# Patient Record
Sex: Female | Born: 1955 | Race: White | Hispanic: No | Marital: Married | Smoking: Never smoker
Health system: Southern US, Community
[De-identification: ages and names within clinical notes are randomized; demographics above are authoritative.]

## PROBLEM LIST (undated history)

## (undated) DIAGNOSIS — E119 Type 2 diabetes mellitus without complications: Secondary | ICD-10-CM

## (undated) DIAGNOSIS — M1990 Unspecified osteoarthritis, unspecified site: Secondary | ICD-10-CM

## (undated) DIAGNOSIS — Z973 Presence of spectacles and contact lenses: Secondary | ICD-10-CM

## (undated) DIAGNOSIS — I10 Essential (primary) hypertension: Secondary | ICD-10-CM

## (undated) DIAGNOSIS — E785 Hyperlipidemia, unspecified: Secondary | ICD-10-CM

## (undated) DIAGNOSIS — N200 Calculus of kidney: Secondary | ICD-10-CM

## (undated) DIAGNOSIS — T7840XA Allergy, unspecified, initial encounter: Secondary | ICD-10-CM

## (undated) DIAGNOSIS — E782 Mixed hyperlipidemia: Secondary | ICD-10-CM

## (undated) DIAGNOSIS — F419 Anxiety disorder, unspecified: Secondary | ICD-10-CM

## (undated) DIAGNOSIS — J189 Pneumonia, unspecified organism: Secondary | ICD-10-CM

## (undated) DIAGNOSIS — Z889 Allergy status to unspecified drugs, medicaments and biological substances status: Secondary | ICD-10-CM

## (undated) DIAGNOSIS — Z87442 Personal history of urinary calculi: Secondary | ICD-10-CM

## (undated) HISTORY — DX: Mixed hyperlipidemia: E78.2

## (undated) HISTORY — DX: Essential (primary) hypertension: I10

## (undated) HISTORY — PX: ABDOMINAL HYSTERECTOMY: SHX81

## (undated) HISTORY — DX: Allergy, unspecified, initial encounter: T78.40XA

---

## 1996-08-05 HISTORY — PX: TOTAL ABDOMINAL HYSTERECTOMY W/ BILATERAL SALPINGOOPHORECTOMY: SHX83

## 2000-03-24 ENCOUNTER — Encounter: Admission: RE | Admit: 2000-03-24 | Discharge: 2000-03-24 | Payer: Self-pay | Admitting: Family Medicine

## 2000-03-24 ENCOUNTER — Encounter: Payer: Self-pay | Admitting: Family Medicine

## 2001-03-26 ENCOUNTER — Encounter: Payer: Self-pay | Admitting: Obstetrics and Gynecology

## 2001-03-26 ENCOUNTER — Encounter: Admission: RE | Admit: 2001-03-26 | Discharge: 2001-03-26 | Payer: Self-pay | Admitting: Obstetrics and Gynecology

## 2003-03-21 ENCOUNTER — Encounter: Payer: Self-pay | Admitting: Family Medicine

## 2003-03-21 ENCOUNTER — Encounter: Admission: RE | Admit: 2003-03-21 | Discharge: 2003-03-21 | Payer: Self-pay | Admitting: Family Medicine

## 2003-06-22 ENCOUNTER — Encounter: Admission: RE | Admit: 2003-06-22 | Discharge: 2003-06-22 | Payer: Self-pay | Admitting: Family Medicine

## 2004-08-22 ENCOUNTER — Encounter: Admission: RE | Admit: 2004-08-22 | Discharge: 2004-08-22 | Payer: Self-pay | Admitting: Family Medicine

## 2005-06-14 ENCOUNTER — Other Ambulatory Visit: Admission: RE | Admit: 2005-06-14 | Discharge: 2005-06-14 | Payer: Self-pay | Admitting: Family Medicine

## 2006-02-17 ENCOUNTER — Ambulatory Visit: Payer: Self-pay | Admitting: Gastroenterology

## 2006-09-23 ENCOUNTER — Ambulatory Visit: Payer: Self-pay | Admitting: Cardiology

## 2006-10-21 ENCOUNTER — Ambulatory Visit: Payer: Self-pay

## 2009-01-24 ENCOUNTER — Encounter: Admission: RE | Admit: 2009-01-24 | Discharge: 2009-01-24 | Payer: Self-pay | Admitting: Orthopedic Surgery

## 2010-08-25 ENCOUNTER — Encounter: Payer: Self-pay | Admitting: Family Medicine

## 2010-12-21 NOTE — Assessment & Plan Note (Signed)
[facility]                           GASTROENTEROLOGY OFFICE NOTE   Kathy [name], Kathy [name]                    MRN:          [id number]  DATE:02/17/2006                            DOB:          1955-12-24    REASON FOR CONSULTATION:  Diverticulitis.   Kathy [name] is a pleasant 55 year old white female referred through the  courtesy of Dr. Smith Mince for evaluation.  In June 2007 she was diagnosed  with acute diverticulitis and treated with antibiotics.  Diagnosis was made  by CAT scan.  She had a prior episode approximately 1-1/2 years before.  Over the last few weeks Kathy [name] has been pain-free.  There is no history  of hematochezia or melena.  There has been no change in her bowel habits.   PAST MEDICAL HISTORY:  Pertinent for hypertension.  She has had kidney  stones.  She is status post hysterectomy and tubal ligation.   FAMILY HISTORY:  Pertinent for mother with breast cancer.   MEDICATIONS:  Include:  Tiazac and Dyazide.   ALLERGIES:  SHE HAS NO ALLERGIES.   She does not smoke.  She drinks rarely.  She is widowed and works as an  Teacher, adult education.   REVIEW OF SYSTEMS:  Positive for sleeping problems and back pain.   PHYSICAL EXAMINATION:  VITAL SIGNS:  Pulse 80, blood pressure 130/88, weight  166 pounds.  HEENT: EOMI. PERRLA. Sclerae are anicteric.  Conjunctivae are pink.  NECK:  Supple without thyromegaly, adenopathy or carotid bruits.  CHEST:  Clear to auscultation and percussion without adventitious sounds.  CARDIAC:  Regular rhythm; normal S1 S2.  There are no murmurs, gallops or  rubs.  ABDOMEN: Bowel sounds are normoactive.  Abdomen is soft, non-tender and non-  distended.  There are no abdominal masses, tenderness, splenic enlargement  or hepatomegaly.  EXTREMITIES:  Full range of motion.  No cyanosis, clubbing or edema.  RECTAL:  Deferred.   IMPRESSION:  Recent recurrent diverticulitis-resolved.   RECOMMENDATION:  Screening  colonoscopy.                                   Barbette Hair. Arlyce Dice, MD, [facility]   RDK/MedQ  DD:  02/17/2006  DT:  02/18/2006  Job #:  [id number]   cc:   Talmadge Coventry, M.D.

## 2010-12-21 NOTE — Assessment & Plan Note (Signed)
[facility]                            CARDIOLOGY OFFICE NOTE   SHAYMA, PFEFFERLE                    MRN:          [id number]  DATE:09/23/2006                            DOB:          Feb 25, 1956    Kathy [name] is a very pleasant 55 year old female with a past medical  history of borderline diabetes mellitus, hypertension, hyperlipidemia,  who we were asked to evaluate for chest pain.  She has no prior cardiac  history.  She typically does not have dyspnea on exertion, orthopnea,  PND, pedal edema, palpations, presyncope, syncope or exertional chest  pain.  This past Saturday she was having intercourse with her boyfriend.  She subsequently developed pain in the left upper chest area.  It was  described as a sharp sensation with mild radiation to the left upper  arm.  The pain did increase with certain movements and also with deep  inspiration.  It lasted for approximately 15 minutes and it resolved  spontaneously.  There was no associated nausea, vomiting, shortness of  breath or diaphoresis.  She has had no symptoms since.  Because of her  pain we were asked to further evaluate.  Of note she has not traveled  recently, nor has she injured her legs.   Her medications include:  1. Tiazac 360 mg p.o. daily.  2. Dyazide 37.5/25 mg tablets, 1 p.o. daily.  3. Crestor 10 mg p.o. daily.  4. She takes Xanax as needed.   She has no known drug allergies.   SOCIAL HISTORY:  She does not smoke.  She does occasionally consume  alcohol.   FAMILY HISTORY:  Is negative for coronary artery disease.  Her father  died of a cerebral aneurysm at age 59.   PAST MEDICAL HISTORY:  Significant for hypertension, hyperlipidemia.  There is borderline diabetes mellitus.  She has a history of  nephrolithiasis.  She has had a prior hysterectomy.   REVIEW OF SYSTEMS:  She denies any headaches or fevers or chills. There  is no productive cough or hemoptysis.  There is  no dysphagia,  odynophagia, melena or hematochezia.  There is no dysuria, hematuria.  She denies seizure activity.  There is no orthopnea, PND or pedal edema.  The remaining systems are negative.   PHYSICAL EXAMINATION:  Today, she is well-developed and mildly obese.  She is in no acute distress.  SKIN:  Is warm and dry.  She does not appear depressed.  There is no peripheral clubbing.  Her back is normal.  I cannot appreciate adenopathy on exam.  HEENT:  Is unremarkable with normal eyelids.  NECK:  Is supple with normal upstroke bilaterally.  There are no bruits.  There is no jugular venous distention.  No thyromegaly.  CHEST:  Clear to auscultation on expansion.  CARDIOVASCULAR:  Shows a regular rate and rhythm with normal S1 and S2.  There is a 1/6 systolic ejection murmur left sternal border.  I cannot  appreciate S3 or S4.  Her PMI is nondisplaced.  She is not tender to  palpation over the left chest area.  ABDOMEN:  Nontender,  positive bowel sounds.  No hepatosplenomegaly.  No  masses appreciated.  There is no abdominal bruit.  She has 2+ femoral  pulses bilaterally.  No bruits.  EXTREMITIES:  Show no edema.  I can palpate no cords.  She has a  negative Homan's bilaterally.  She has 2+ dorsalis pedis pulses  bilaterally.  NEUROLOGICAL:  Is grossly intact.   Electrocardiogram shows a sinus rhythm at a rate of 67.  Her axis is  normal.  There are no ST changes noted.   DIAGNOSES:  1. Atypical chest pain.  2. Hypertension.  3. Hyperlipidemia.  4. Borderline diabetes mellitus.   PLAN:  Mrs. Manka presents for evaluation of chest pain that is  somewhat atypical.  Her electrocardiogram shows no ST changes.  She did  state that there was a pleuritic component.  I do not palpate cords on  exam and she has a negative Homan's bilaterally.  I will schedule her to  have a D-Dimer.  If this is unremarkable, then I think the likelihood of  pulmonary embolus is almost zero.  She is  also contemplating beginning  an exercise program for weight loss.  I think her chest pain is atypical  for ischemia but we will risk stratify with a stress Myoview.  If she  has normal perfusion we will not proceed with further cardiac workup.  We will continue with risk factor modification today and I discussed  diet and exercise.  She will see [place] back on an as needed basis pending  those results.     Madolyn Frieze Jens Som, MD, [facility]  Electronically Signed    BSC/MedQ  DD: 09/23/2006  DT: 09/24/2006  Job #: [id number]   cc:   Talmadge Coventry, M.D.

## 2013-11-21 ENCOUNTER — Encounter (HOSPITAL_BASED_OUTPATIENT_CLINIC_OR_DEPARTMENT_OTHER): Payer: Self-pay | Admitting: Emergency Medicine

## 2013-11-21 ENCOUNTER — Emergency Department (HOSPITAL_BASED_OUTPATIENT_CLINIC_OR_DEPARTMENT_OTHER)
Admission: EM | Admit: 2013-11-21 | Discharge: 2013-11-21 | Disposition: A | Discharge status: Home / Self Care | Payer: BC Managed Care – PPO | Attending: Emergency Medicine | Admitting: Emergency Medicine

## 2013-11-21 ENCOUNTER — Emergency Department (HOSPITAL_BASED_OUTPATIENT_CLINIC_OR_DEPARTMENT_OTHER): Payer: BC Managed Care – PPO

## 2013-11-21 DIAGNOSIS — M25579 Pain in unspecified ankle and joints of unspecified foot: Secondary | ICD-10-CM | POA: Insufficient documentation

## 2013-11-21 DIAGNOSIS — Z79899 Other long term (current) drug therapy: Secondary | ICD-10-CM | POA: Insufficient documentation

## 2013-11-21 DIAGNOSIS — E119 Type 2 diabetes mellitus without complications: Secondary | ICD-10-CM | POA: Insufficient documentation

## 2013-11-21 DIAGNOSIS — I10 Essential (primary) hypertension: Secondary | ICD-10-CM | POA: Insufficient documentation

## 2013-11-21 DIAGNOSIS — M79671 Pain in right foot: Secondary | ICD-10-CM

## 2013-11-21 HISTORY — DX: Essential (primary) hypertension: I10

## 2013-11-21 HISTORY — DX: Type 2 diabetes mellitus without complications: E11.9

## 2013-11-21 MED ORDER — HYDROCODONE-ACETAMINOPHEN 5-325 MG PO TABS: 2.0000 | ORAL_TABLET | ORAL | Status: DC | PRN

## 2013-11-21 NOTE — ED Notes (Signed)
The patient refused the post-op shoe.

## 2013-11-21 NOTE — ED Provider Notes (Signed)
CSN: [id number]     Arrival date & time 11/21/13  1109 History   First MD Initiated Contact with Patient 11/21/13 1159     Chief Complaint  Patient presents with  . Foot Injury     (Consider location/radiation/quality/duration/timing/severity/associated sxs/prior Treatment) Patient is a 58 y.o. female presenting with foot injury. The history is provided by the patient.  Foot Injury Location:  Foot Time since incident:  2 days Injury: no   Foot location:  R foot Pain details:    Quality:  Aching   Radiates to:  Does not radiate   Severity:  Moderate   Onset quality:  Sudden   Timing:  Constant   Progression:  Unchanged Chronicity:  New Dislocation: no   Foreign body present:  No foreign bodies Prior injury to area:  No Relieved by:  Nothing Worsened by:  Bearing weight Ineffective treatments:  None tried  Kathy [name] is a 58 y.o. female who presents to the ED with right foot pain. She states that she was sitting at her desk 2 days ago at work with her feet propped up and felt a pain in the top of her foot near her 3rd, 4th and 5th toes. She stood up and when she put weight on the area it caused severe pain. She denies injury to the area. She denies any other problems. PMH includes HTN and Diabetes.   Past Medical History  Diagnosis Date  . Hypertension   . Diabetes mellitus without complication    Past Surgical History  Procedure Laterality Date  . Abdominal hysterectomy     No family history on file. History  Substance Use Topics  . Smoking status: Never Smoker   . Smokeless tobacco: Not on file  . Alcohol Use: No   OB History   Grav Para Term Preterm Abortions TAB SAB Ect Mult Living                 Review of Systems Negative except as stated in HPI   Allergies  Review of patient's allergies indicates no known allergies.  Home Medications   Prior to Admission medications   Medication Sig Start Date End Date Taking? Authorizing Provider  ezetimibe  (ZETIA) 10 MG tablet Take 10 mg by mouth daily.   Yes Historical Provider, MD  lisinopril (PRINIVIL,ZESTRIL) 20 MG tablet Take 20 mg by mouth daily.   Yes Historical Provider, MD  metFORMIN (GLUCOPHAGE) 1000 MG tablet Take 1,000 mg by mouth 2 (two) times daily with a meal.   Yes Historical Provider, MD  triamterene-hydrochlorothiazide (MAXZIDE-25) 37.5-25 MG per tablet Take 1 tablet by mouth daily.   Yes Historical Provider, MD   BP 169/90  Pulse 60  Temp(Src) 98.2 F (36.8 C) (Oral)  Resp 18  Ht 5\' 2"  (1.575 m)  Wt 205 lb (92.987 kg)  BMI 37.49 kg/m2  SpO2 97% Physical Exam  Nursing note and vitals reviewed. Constitutional: She is oriented to person, place, and time. She appears well-developed and well-nourished. No distress.  HENT:  Head: Normocephalic.  Eyes: Conjunctivae and EOM are normal.  Neck: Neck supple.  Cardiovascular: Normal rate.   Pulmonary/Chest: Effort normal.  Musculoskeletal: Normal range of motion.       Right foot: She exhibits tenderness. She exhibits normal range of motion, no swelling, normal capillary refill, no deformity and no laceration.       Feet:  Tender on palpation dorsum of right foot near toes and to the lateral aspect. Pedal pulses  equal, adequate circulation, good touch sensation.   Neurological: She is alert and oriented to person, place, and time. No cranial nerve deficit.  Skin: Skin is warm and dry.  Psychiatric: She has a normal mood and affect. Her behavior is normal.    ED Course  Procedures Dg Foot Complete Right  11/21/2013   CLINICAL DATA:  Pain without trauma.  EXAM: RIGHT FOOT COMPLETE - 3+ VIEW  COMPARISON:  None.  FINDINGS: Small Achilles and calcaneal spurs. No acute fracture or dislocation. No callus deposition or periosteal reaction.  IMPRESSION: No acute osseous abnormality.   Electronically Signed   By: Jeronimo GreavesKyle  Talbot M.D.   On: 11/21/2013 12:30    MDM  58 y.o. female with pain to the dorsum of the right foot. Ace wrap  applied for comfort, ice elevation and pain management. Stable for discharge and remains neurovascularly intact. Discussed with the patient clinical and x-ray findings and all questioned fully answered. She will return if any problems arise.    Medication List    TAKE these medications       HYDROcodone-acetaminophen 5-325 MG per tablet  Commonly known as:  NORCO/VICODIN  Take 2 tablets by mouth every 4 (four) hours as needed.      ASK your doctor about these medications       ezetimibe 10 MG tablet  Commonly known as:  ZETIA  Take 10 mg by mouth daily.     lisinopril 20 MG tablet  Commonly known as:  PRINIVIL,ZESTRIL  Take 20 mg by mouth daily.     metFORMIN 1000 MG tablet  Commonly known as:  GLUCOPHAGE  Take 1,000 mg by mouth 2 (two) times daily with a meal.     triamterene-hydrochlorothiazide 37.5-25 MG per tablet  Commonly known as:  MAXZIDE-25  Take 1 tablet by mouth daily.          [facility] Kathy [name], [place]NP 11/22/13 0030

## 2013-11-21 NOTE — ED Notes (Signed)
Friday started having right foot pain. Pain is in the arch and top towards the right. Good color and pulse

## 2013-11-21 NOTE — Discharge Instructions (Signed)
Your x-ray today is normal. The pain you are having may be tendonitis. We are treating you for pain. In addition take ibuprofen to reduce inflammation. Wear the post op shoe to avoid movement of the toes that would increase the pain. Follow up with Dr. Pearletha ForgeHudnall if pain persist.  It was nice to meet you. Thank you for allowing me to care for you today. Return as needed for any problems.

## 2013-11-22 NOTE — ED Provider Notes (Signed)
Medical screening examination/treatment/procedure(s) were performed by non-physician practitioner and as supervising physician I was immediately available for consultation/collaboration.   EKG Interpretation None        Shanna CiscoMegan E Docherty, MD 11/22/13 1009

## 2016-01-14 ENCOUNTER — Emergency Department (HOSPITAL_COMMUNITY)
Admission: EM | Admit: 2016-01-14 | Discharge: 2016-01-14 | Disposition: A | Discharge status: Home / Self Care | Payer: BLUE CROSS/BLUE SHIELD | Attending: Emergency Medicine | Admitting: Emergency Medicine

## 2016-01-14 ENCOUNTER — Encounter (HOSPITAL_COMMUNITY): Payer: Self-pay

## 2016-01-14 ENCOUNTER — Emergency Department (HOSPITAL_COMMUNITY): Payer: BLUE CROSS/BLUE SHIELD

## 2016-01-14 DIAGNOSIS — R1031 Right lower quadrant pain: Secondary | ICD-10-CM | POA: Diagnosis not present

## 2016-01-14 DIAGNOSIS — R109 Unspecified abdominal pain: Secondary | ICD-10-CM

## 2016-01-14 DIAGNOSIS — Z7984 Long term (current) use of oral hypoglycemic drugs: Secondary | ICD-10-CM | POA: Diagnosis not present

## 2016-01-14 DIAGNOSIS — N200 Calculus of kidney: Secondary | ICD-10-CM | POA: Insufficient documentation

## 2016-01-14 DIAGNOSIS — R1012 Left upper quadrant pain: Secondary | ICD-10-CM | POA: Diagnosis present

## 2016-01-14 DIAGNOSIS — R1011 Right upper quadrant pain: Secondary | ICD-10-CM | POA: Diagnosis not present

## 2016-01-14 DIAGNOSIS — R1084 Generalized abdominal pain: Secondary | ICD-10-CM | POA: Diagnosis not present

## 2016-01-14 DIAGNOSIS — I10 Essential (primary) hypertension: Secondary | ICD-10-CM | POA: Insufficient documentation

## 2016-01-14 DIAGNOSIS — R110 Nausea: Secondary | ICD-10-CM | POA: Diagnosis not present

## 2016-01-14 DIAGNOSIS — E119 Type 2 diabetes mellitus without complications: Secondary | ICD-10-CM | POA: Insufficient documentation

## 2016-01-14 LAB — CBC WITH DIFFERENTIAL/PLATELET
Basophils Absolute: 0 10*3/uL (ref 0.0–0.1)
Basophils Relative: 0 %
Eosinophils Absolute: 0 10*3/uL (ref 0.0–0.7)
Eosinophils Relative: 0 %
HCT: 42.1 % (ref 36.0–46.0)
Hemoglobin: 13.6 g/dL (ref 12.0–15.0)
Lymphocytes Relative: 10 %
Lymphs Abs: 1.2 10*3/uL (ref 0.7–4.0)
MCH: 29.1 pg (ref 26.0–34.0)
MCHC: 32.3 g/dL (ref 30.0–36.0)
MCV: 90.1 fL (ref 78.0–100.0)
Monocytes Absolute: 0.4 10*3/uL (ref 0.1–1.0)
Monocytes Relative: 4 %
Neutro Abs: 10.1 10*3/uL — ABNORMAL HIGH (ref 1.7–7.7)
Neutrophils Relative %: 86 %
Platelets: 306 10*3/uL (ref 150–400)
RBC: 4.67 MIL/uL (ref 3.87–5.11)
RDW: 13.5 % (ref 11.5–15.5)
WBC: 11.8 10*3/uL — ABNORMAL HIGH (ref 4.0–10.5)

## 2016-01-14 LAB — URINALYSIS, ROUTINE W REFLEX MICROSCOPIC
Bilirubin Urine: NEGATIVE
Glucose, UA: NEGATIVE mg/dL
Hgb urine dipstick: NEGATIVE
Ketones, ur: NEGATIVE mg/dL
Leukocytes, UA: NEGATIVE
Nitrite: NEGATIVE
Protein, ur: NEGATIVE mg/dL
Specific Gravity, Urine: 1.014 (ref 1.005–1.030)
pH: 5.5 (ref 5.0–8.0)

## 2016-01-14 LAB — COMPREHENSIVE METABOLIC PANEL
ALT: 17 U/L (ref 14–54)
AST: 21 U/L (ref 15–41)
Albumin: 4.1 g/dL (ref 3.5–5.0)
Alkaline Phosphatase: 58 U/L (ref 38–126)
Anion gap: 10 (ref 5–15)
BUN: 16 mg/dL (ref 6–20)
CO2: 25 mmol/L (ref 22–32)
Calcium: 9.9 mg/dL (ref 8.9–10.3)
Chloride: 101 mmol/L (ref 101–111)
Creatinine, Ser: 0.93 mg/dL (ref 0.44–1.00)
GFR calc Af Amer: >60 mL/min (ref >60)
GFR calc non Af Amer: >60 mL/min (ref >60)
Glucose, Bld: 166 mg/dL — ABNORMAL HIGH (ref 65–99)
Potassium: 4 mmol/L (ref 3.5–5.1)
Sodium: 136 mmol/L (ref 135–145)
Total Bilirubin: 0.4 mg/dL (ref 0.3–1.2)
Total Protein: 6.9 g/dL (ref 6.5–8.1)

## 2016-01-14 LAB — LIPASE, BLOOD: Lipase: 33 U/L (ref 11–51)

## 2016-01-14 MED ORDER — TAMSULOSIN HCL 0.4 MG PO CAPS: 0.4000 mg | ORAL_CAPSULE | Freq: Every day | ORAL | Status: DC

## 2016-01-14 MED ORDER — SODIUM CHLORIDE 0.9 % IV BOLUS (SEPSIS)
1000.0000 mL | Freq: Once | INTRAVENOUS | Status: AC
  Administered 2016-01-14: 1000 mL via INTRAVENOUS

## 2016-01-14 MED ORDER — KETOROLAC TROMETHAMINE 10 MG PO TABS: 10.0000 mg | ORAL_TABLET | Freq: Four times a day (QID) | ORAL | Status: DC | PRN

## 2016-01-14 MED ORDER — IOPAMIDOL (ISOVUE-300) INJECTION 61%
INTRAVENOUS | Status: AC
  Administered 2016-01-14: 100 mL
  Filled 2016-01-14: qty 100

## 2016-01-14 MED ORDER — MORPHINE SULFATE (PF) 4 MG/ML IV SOLN
4.0000 mg | Freq: Once | INTRAVENOUS | Status: AC
  Administered 2016-01-14: 4 mg via INTRAVENOUS
  Filled 2016-01-14: qty 1

## 2016-01-14 MED ORDER — ONDANSETRON HCL 4 MG PO TABS: 4.0000 mg | ORAL_TABLET | Freq: Four times a day (QID) | ORAL | Status: DC

## 2016-01-14 MED ORDER — OXYCODONE-ACETAMINOPHEN 5-325 MG PO TABS: 1.0000 | ORAL_TABLET | ORAL | Status: DC | PRN

## 2016-01-14 NOTE — ED Provider Notes (Signed)
CSN: [id number]     Arrival date & time 01/14/16  1656 History   First MD Initiated Contact with Patient 01/14/16 1703     Chief Complaint  Patient presents with  . Abdominal Pain   HPI   60 year old female presents today with complaints of abdominal pain. Patient reports symptoms started this morning with epigastric and left upper quadrant abdominal pain. Patient notes that she was able to tolerate by mouth, this did not make her symptoms worse. She denies any nausea vomiting, fever, chills, change in bowel or bladder characteristics or frequency. Patient reports that the pain spread up to her right upper quadrant, right lower quadrant. She was seen in urgent care, had plain films and urinalysis with no significant findings. Patient reports she had significant palpation to the right lower quadrant at that time. She was given Toradol and instructed follow-up in the emergency room. Patient reports she has a history of a hysterectomy, complete, no other abdominal surgeries. Patient entered using Nexium today with no relief of her symptoms. She denies any history of the same. Patient does report that she has a history of kidney stones, denies close anything like that. Patient reports a history of hypertension.  Past Medical History  Diagnosis Date  . Hypertension   . Diabetes mellitus without complication [facility])    Past Surgical History  Procedure Laterality Date  . Abdominal hysterectomy     No family history on file. Social History  Substance Use Topics  . Smoking status: Never Smoker   . Smokeless tobacco: None  . Alcohol Use: No   OB History    No data available     Review of Systems  All other systems reviewed and are negative.   Allergies  Dexamethasone  Home Medications   Prior to Admission medications   Medication Sig Start Date End Date Taking? Authorizing Provider  diltiazem (TIAZAC) 300 MG 24 hr capsule Take 300 mg by mouth daily.   Yes Historical Provider, MD   ezetimibe (ZETIA) 10 MG tablet Take 10 mg by mouth daily.   Yes Historical Provider, MD  lisinopril (PRINIVIL,ZESTRIL) 20 MG tablet Take 20 mg by mouth daily.   Yes Historical Provider, MD  metFORMIN (GLUCOPHAGE) 1000 MG tablet Take 1,000 mg by mouth daily.    Yes Historical Provider, MD  triamterene-hydrochlorothiazide (MAXZIDE-25) 37.5-25 MG per tablet Take 1 tablet by mouth daily.   Yes Historical Provider, MD  HYDROcodone-acetaminophen (NORCO/VICODIN) 5-325 MG per tablet Take 2 tablets by mouth every 4 (four) hours as needed. Patient not taking: Reported on 01/14/2016 11/21/13   Janne Napoleon, NP  ketorolac (TORADOL) 10 MG tablet Take 1 tablet (10 mg total) by mouth every 6 (six) hours as needed. 01/14/16   Eyvonne Mechanic, PA-C  oxyCODONE-acetaminophen (PERCOCET/ROXICET) 5-325 MG tablet Take 1 tablet by mouth every 4 (four) hours as needed for severe pain. 01/14/16   Kiesha Ensey, PA-C   BP 120/68 mmHg  Pulse 56  Temp(Src) 97.9 F (36.6 C) (Oral)  Resp 18  Ht 5\' 2"  (1.575 m)  Wt 99.508 kg  BMI 40.11 kg/m2  SpO2 98%   Physical Exam  Constitutional: She is oriented to person, place, and time. She appears well-developed and well-nourished.  HENT:  Head: Normocephalic and atraumatic.  Eyes: Conjunctivae are normal. Pupils are equal, round, and reactive to light. Right eye exhibits no discharge. Left eye exhibits no discharge. No scleral icterus.  Neck: Normal range of motion. No JVD present. No tracheal deviation present.  Pulmonary/Chest: Effort normal. No stridor.  Abdominal:  Tenderness to palpation of right upper quadrant, Murphy sign positive, very minimal right lower quadrant tenderness to palpation  Neurological: She is alert and oriented to person, place, and time. Coordination normal.  Psychiatric: She has a normal mood and affect. Her behavior is normal. Judgment and thought content normal.  Nursing note and vitals reviewed.   ED Course  Procedures (including critical care  time) Labs Review Labs Reviewed  CBC WITH DIFFERENTIAL/PLATELET - Abnormal; Notable for the following:    WBC 11.8 (*)    Neutro Abs 10.1 (*)    All other components within normal limits  COMPREHENSIVE METABOLIC PANEL - Abnormal; Notable for the following:    Glucose, Bld 166 (*)    All other components within normal limits  URINALYSIS, ROUTINE W REFLEX MICROSCOPIC (NOT AT [facility])  LIPASE, BLOOD    Imaging Review Ct Abdomen Pelvis W Contrast  01/14/2016  CLINICAL DATA:  Acute onset of epigastric abdominal pain. Initial encounter. EXAM: CT ABDOMEN AND PELVIS WITH CONTRAST TECHNIQUE: Multidetector CT imaging of the abdomen and pelvis was performed using the standard protocol following bolus administration of intravenous contrast. CONTRAST:  100mL ISOVUE-300 IOPAMIDOL (ISOVUE-300) INJECTION 61% COMPARISON:  MRI of the lumbar spine performed 01/24/2009, and CT of the abdomen and pelvis from 08/22/2004 FINDINGS: Minimal bibasilar atelectasis is noted. The liver and spleen are unremarkable in appearance. The gallbladder is within normal limits. The pancreas and adrenal glands are unremarkable. A 3.5 cm cyst is noted at the posterior aspect of the left kidney. Minimal nonspecific perinephric stranding is noted bilaterally. A nonobstructing 9 mm stone is noted at the right renal pelvis. No obstructing ureteral stones are identified. There is no evidence of hydronephrosis. No free fluid is identified. The small bowel is unremarkable in appearance. The stomach is within normal limits. No acute vascular abnormalities are seen. Minimal calcification is seen along the abdominal aorta and its branches. The appendix is normal in caliber, without evidence of appendicitis. Minimal diverticulosis is noted along the sigmoid colon, without evidence of diverticulitis. The bladder is mildly distended and grossly unremarkable. The patient is status post hysterectomy. No suspicious adnexal masses are seen. No inguinal  lymphadenopathy is seen. No acute osseous abnormalities are identified. IMPRESSION: 1. No acute abnormality seen to explain the patient's symptoms. 2. Nonobstructing 9 mm stone at the right renal pelvis. No evidence of hydronephrosis. 3. Left renal cyst noted. 4. Minimal diverticulosis along the sigmoid colon, without evidence of diverticulitis. Electronically Signed   By: Roanna RaiderJeffery  Chang M.D.   On: 01/14/2016 19:21   I have personally reviewed and evaluated these images and lab results as part of my medical decision-making.   EKG Interpretation None      MDM   Final diagnoses:  Abdominal pain  Nephrolithiasis    Labs: CBC, CMP, lipase normal saline  Imaging: CT abdomen and pelvis with contrast  Consults:  Therapeutics: Morphine  Discharge Meds:   Assessment/Plan: 60 year old female presents today with complaints of abdominal pain. Initial concern for gallstones. Patient also reported that she was having right lower quadrant pain additionally. CT scan ordered at that time. Labs returned showing no significant findings, no gallbladder pancreatic or liver abnormalities. CT scan showed right renal pelvis stone proximally 9 mm with no signs of instruction. Patient's urine shows no signs of urinary tract infection. Patient's symptoms atypical for kidney stones, but there are no concerning signs or symptoms for any other intra-abdominal pathology. I have very low  suspicion for dissection, patient's vital signs have remained stable here, she has no palpable masses, CT scan shows no widening of the aorta, pulses equal bilateral. This is unlikely biliary colic, symptoms are not worsened with eating or drinking, she is tolerating by mouth without difficulty, she has no fever, nausea or vomiting. Due to significant size of stone patient will referred to urology. Patient has no signs of obstruction, urinary tract infection, tolerating by mouth. Patient instructed follow-up with urology, pain medication  given. Strict return precautions given. Patient verbalized understanding and agreement to today's plan had no further questions or concerns at time of discharge        Eyvonne Mechanic, PA-C 01/14/16 2155  Alvira Monday, MD 01/15/16 1246

## 2016-01-14 NOTE — ED Notes (Signed)
Patient here from urgent care for further evaluation of abdominal pain. Started suddenly 3 hours pta. No vomiting, no diarrhea. No change in pain with ambulation. Had toradol injection pta.

## 2016-01-14 NOTE — Discharge Instructions (Signed)

## 2016-01-15 ENCOUNTER — Observation Stay (HOSPITAL_COMMUNITY): Payer: BLUE CROSS/BLUE SHIELD

## 2016-01-15 ENCOUNTER — Encounter (HOSPITAL_COMMUNITY): Payer: Self-pay

## 2016-01-15 ENCOUNTER — Observation Stay (HOSPITAL_COMMUNITY)
Admission: AD | Admit: 2016-01-15 | Discharge: 2016-01-18 | Disposition: A | Discharge status: Home / Self Care | Payer: BLUE CROSS/BLUE SHIELD | Source: Ambulatory Visit | Attending: Internal Medicine | Admitting: Internal Medicine

## 2016-01-15 DIAGNOSIS — Z419 Encounter for procedure for purposes other than remedying health state, unspecified: Secondary | ICD-10-CM

## 2016-01-15 DIAGNOSIS — R1011 Right upper quadrant pain: Secondary | ICD-10-CM | POA: Diagnosis not present

## 2016-01-15 DIAGNOSIS — I10 Essential (primary) hypertension: Secondary | ICD-10-CM | POA: Diagnosis not present

## 2016-01-15 DIAGNOSIS — E785 Hyperlipidemia, unspecified: Secondary | ICD-10-CM

## 2016-01-15 DIAGNOSIS — K8020 Calculus of gallbladder without cholecystitis without obstruction: Secondary | ICD-10-CM | POA: Diagnosis not present

## 2016-01-15 DIAGNOSIS — R0602 Shortness of breath: Secondary | ICD-10-CM | POA: Diagnosis not present

## 2016-01-15 DIAGNOSIS — E782 Mixed hyperlipidemia: Secondary | ICD-10-CM | POA: Diagnosis present

## 2016-01-15 DIAGNOSIS — R1013 Epigastric pain: Secondary | ICD-10-CM | POA: Diagnosis present

## 2016-01-15 DIAGNOSIS — Z79899 Other long term (current) drug therapy: Secondary | ICD-10-CM | POA: Diagnosis not present

## 2016-01-15 DIAGNOSIS — Z841 Family history of disorders of kidney and ureter: Secondary | ICD-10-CM | POA: Insufficient documentation

## 2016-01-15 DIAGNOSIS — Z7984 Long term (current) use of oral hypoglycemic drugs: Secondary | ICD-10-CM | POA: Diagnosis not present

## 2016-01-15 DIAGNOSIS — Z6841 Body Mass Index (BMI) 40.0 and over, adult: Secondary | ICD-10-CM | POA: Insufficient documentation

## 2016-01-15 DIAGNOSIS — K8012 Calculus of gallbladder with acute and chronic cholecystitis without obstruction: Principal | ICD-10-CM | POA: Insufficient documentation

## 2016-01-15 DIAGNOSIS — N200 Calculus of kidney: Secondary | ICD-10-CM

## 2016-01-15 DIAGNOSIS — E119 Type 2 diabetes mellitus without complications: Secondary | ICD-10-CM | POA: Diagnosis not present

## 2016-01-15 DIAGNOSIS — R109 Unspecified abdominal pain: Secondary | ICD-10-CM

## 2016-01-15 DIAGNOSIS — K810 Acute cholecystitis: Secondary | ICD-10-CM | POA: Diagnosis present

## 2016-01-15 DIAGNOSIS — K8066 Calculus of gallbladder and bile duct with acute and chronic cholecystitis without obstruction: Secondary | ICD-10-CM | POA: Diagnosis not present

## 2016-01-15 DIAGNOSIS — R1010 Upper abdominal pain, unspecified: Secondary | ICD-10-CM | POA: Diagnosis not present

## 2016-01-15 HISTORY — DX: Personal history of urinary calculi: Z87.442

## 2016-01-15 HISTORY — DX: Anxiety disorder, unspecified: F41.9

## 2016-01-15 HISTORY — DX: Calculus of kidney: N20.0

## 2016-01-15 HISTORY — DX: Epigastric pain: R10.13

## 2016-01-15 HISTORY — DX: Hyperlipidemia, unspecified: E78.5

## 2016-01-15 LAB — CBC WITH DIFFERENTIAL/PLATELET
Basophils Absolute: 0 10*3/uL (ref 0.0–0.1)
Basophils Relative: 0 %
Eosinophils Absolute: 0 10*3/uL (ref 0.0–0.7)
Eosinophils Relative: 0 %
HCT: 39.6 % (ref 36.0–46.0)
Hemoglobin: 13.7 g/dL (ref 12.0–15.0)
Lymphocytes Relative: 9 %
Lymphs Abs: 1.6 10*3/uL (ref 0.7–4.0)
MCH: 30.4 pg (ref 26.0–34.0)
MCHC: 34.6 g/dL (ref 30.0–36.0)
MCV: 88 fL (ref 78.0–100.0)
Monocytes Absolute: 1.4 10*3/uL — ABNORMAL HIGH (ref 0.1–1.0)
Monocytes Relative: 7 %
Neutro Abs: 15.7 10*3/uL — ABNORMAL HIGH (ref 1.7–7.7)
Neutrophils Relative %: 84 %
Platelets: 344 10*3/uL (ref 150–400)
RBC: 4.5 MIL/uL (ref 3.87–5.11)
RDW: 13.8 % (ref 11.5–15.5)
WBC: 18.7 10*3/uL — ABNORMAL HIGH (ref 4.0–10.5)

## 2016-01-15 LAB — COMPREHENSIVE METABOLIC PANEL
ALT: 26 U/L (ref 14–54)
AST: 45 U/L — ABNORMAL HIGH (ref 15–41)
Albumin: 4.4 g/dL (ref 3.5–5.0)
Alkaline Phosphatase: 58 U/L (ref 38–126)
Anion gap: 8 (ref 5–15)
BUN: 15 mg/dL (ref 6–20)
CO2: 29 mmol/L (ref 22–32)
Calcium: 9.4 mg/dL (ref 8.9–10.3)
Chloride: 99 mmol/L — ABNORMAL LOW (ref 101–111)
Creatinine, Ser: 0.85 mg/dL (ref 0.44–1.00)
GFR calc Af Amer: >60 mL/min (ref >60)
GFR calc non Af Amer: >60 mL/min (ref >60)
Glucose, Bld: 149 mg/dL — ABNORMAL HIGH (ref 65–99)
Potassium: 4.6 mmol/L (ref 3.5–5.1)
Sodium: 136 mmol/L (ref 135–145)
Total Bilirubin: 1.4 mg/dL — ABNORMAL HIGH (ref 0.3–1.2)
Total Protein: 7.6 g/dL (ref 6.5–8.1)

## 2016-01-15 LAB — TROPONIN I
Troponin I: <0.03 ng/mL (ref <0.031)
Troponin I: <0.03 ng/mL (ref <0.031)

## 2016-01-15 LAB — GLUCOSE, CAPILLARY
Glucose-Capillary: 125 mg/dL — ABNORMAL HIGH (ref 65–99)
Glucose-Capillary: 155 mg/dL — ABNORMAL HIGH (ref 65–99)

## 2016-01-15 LAB — LIPASE, BLOOD: Lipase: 25 U/L (ref 11–51)

## 2016-01-15 LAB — AMYLASE: Amylase: 42 U/L (ref 28–100)

## 2016-01-15 LAB — LACTIC ACID, PLASMA: Lactic Acid, Venous: 1.6 mmol/L (ref 0.5–2.0)

## 2016-01-15 MED ORDER — MAGNESIUM HYDROXIDE 400 MG/5ML PO SUSP: 30.0000 mL | Freq: Every day | ORAL | Status: DC | PRN

## 2016-01-15 MED ORDER — BISACODYL 10 MG RE SUPP: 10.0000 mg | Freq: Every day | RECTAL | Status: DC | PRN

## 2016-01-15 MED ORDER — INSULIN ASPART 100 UNIT/ML ~~LOC~~ SOLN
0.0000 [IU] | SUBCUTANEOUS | Status: DC
  Administered 2016-01-15: 1 [IU] via SUBCUTANEOUS
  Administered 2016-01-15: 2 [IU] via SUBCUTANEOUS
  Administered 2016-01-16: 1 [IU] via SUBCUTANEOUS
  Administered 2016-01-16: 2 [IU] via SUBCUTANEOUS
  Administered 2016-01-16: 1 [IU] via SUBCUTANEOUS
  Administered 2016-01-16: 2 [IU] via SUBCUTANEOUS
  Administered 2016-01-16: 1 [IU] via SUBCUTANEOUS
  Administered 2016-01-17 – 2016-01-18 (×4): 2 [IU] via SUBCUTANEOUS
  Administered 2016-01-18: 1 [IU] via SUBCUTANEOUS

## 2016-01-15 MED ORDER — OXYCODONE HCL 5 MG PO TABS
5.0000 mg | ORAL_TABLET | ORAL | Status: DC | PRN
  Administered 2016-01-17 – 2016-01-18 (×2): 5 mg via ORAL
  Filled 2016-01-15 (×3): qty 1

## 2016-01-15 MED ORDER — ZOLPIDEM TARTRATE 5 MG PO TABS: 5.0000 mg | ORAL_TABLET | Freq: Every evening | ORAL | Status: DC | PRN

## 2016-01-15 MED ORDER — DEXTROSE 5 % IV SOLN
2.0000 g | INTRAVENOUS | Status: DC
  Administered 2016-01-15 – 2016-01-18 (×3): 2 g via INTRAVENOUS
  Filled 2016-01-15 (×4): qty 2

## 2016-01-15 MED ORDER — DIPHENHYDRAMINE HCL 50 MG/ML IJ SOLN: 12.5000 mg | Freq: Four times a day (QID) | INTRAMUSCULAR | Status: DC | PRN

## 2016-01-15 MED ORDER — KCL IN DEXTROSE-NACL 20-5-0.45 MEQ/L-%-% IV SOLN
INTRAVENOUS | Status: DC
  Administered 2016-01-15 – 2016-01-18 (×5): via INTRAVENOUS
  Filled 2016-01-15 (×9): qty 1000

## 2016-01-15 MED ORDER — ONDANSETRON HCL 4 MG/2ML IJ SOLN: 4.0000 mg | INTRAMUSCULAR | Status: DC | PRN

## 2016-01-15 MED ORDER — FAMOTIDINE IN NACL 20-0.9 MG/50ML-% IV SOLN
20.0000 mg | Freq: Two times a day (BID) | INTRAVENOUS | Status: DC
  Administered 2016-01-15 – 2016-01-18 (×6): 20 mg via INTRAVENOUS
  Filled 2016-01-15 (×6): qty 50

## 2016-01-15 MED ORDER — ACETAMINOPHEN 325 MG PO TABS
650.0000 mg | ORAL_TABLET | ORAL | Status: DC | PRN
  Administered 2016-01-18: 650 mg via ORAL
  Filled 2016-01-15: qty 2

## 2016-01-15 MED ORDER — DIPHENHYDRAMINE HCL 12.5 MG/5ML PO ELIX: 12.5000 mg | ORAL_SOLUTION | Freq: Four times a day (QID) | ORAL | Status: DC | PRN

## 2016-01-15 MED ORDER — METHOCARBAMOL 500 MG PO TABS
500.0000 mg | ORAL_TABLET | Freq: Four times a day (QID) | ORAL | Status: DC | PRN
  Administered 2016-01-18: 500 mg via ORAL
  Filled 2016-01-15: qty 1

## 2016-01-15 MED ORDER — MORPHINE SULFATE (PF) 2 MG/ML IV SOLN
2.0000 mg | INTRAVENOUS | Status: DC | PRN
  Administered 2016-01-15 (×3): 4 mg via INTRAVENOUS
  Administered 2016-01-16 (×4): 2 mg via INTRAVENOUS
  Administered 2016-01-17 (×2): 4 mg via INTRAVENOUS
  Filled 2016-01-15: qty 1
  Filled 2016-01-15: qty 2
  Filled 2016-01-15: qty 1
  Filled 2016-01-15: qty 2
  Filled 2016-01-15 (×2): qty 1
  Filled 2016-01-15 (×3): qty 2

## 2016-01-15 NOTE — Progress Notes (Signed)
Pharmacy Antibiotic Note  Robert Bellowatricia M Schuhmacher is a 60 y.o. female admitted on 01/15/2016 with RUQ pain with concern for pancreatitis or cholecystitis.   Pharmacy has been consulted for ceftriaxone dosing. No antibiotic allergies noted.  No cultures ordered yet.   Plan: Ceftriaxone 2g IV q24h Dosage remains stable and need for further dosage adjustment appears unlikely at present.    Will sign off at this time.  Please reconsult if a change in clinical status warrants re-evaluation of dosage.  Haynes Hoehnolleen Deandra Gadson, PharmD, BCPS 01/15/2016, 5:13 PM  Pager: [phone]    Height: 5\' 2"  (157.5 cm) Weight: 219 lb 11.2 oz (99.655 kg) IBW/kg (Calculated) : 50.1  Temp (24hrs), Avg:98.2 F (36.8 C), Min:98.2 F (36.8 C), Max:98.2 F (36.8 C)   Recent Labs Lab 01/14/16 1752 01/15/16 1352  WBC 11.8* 18.7*  CREATININE 0.93  --     Estimated Creatinine Clearance: 70.1 mL/min (by C-G formula based on Cr of 0.93).    Allergies  Allergen Reactions  . Atorvastatin Other (See Comments)    intolerance  . Rosuvastatin Other (See Comments)    intolerance  . Simvastatin Other (See Comments)    intolerance  . Dexamethasone Rash    Antimicrobials this admission: 6/12 Ceftriaxone >>   Dose adjustments this admission:  Microbiology results: Thank you for allowing pharmacy to be a part of this patient's care.  Haynes Hoehnolleen Sheretta Grumbine, PharmD, BCPS 01/15/2016, 5:13 PM  Pager: [phone]

## 2016-01-15 NOTE — Consult Note (Signed)
Re:   [name] DOB:   November 01, 1955 MRN:   [id number]  ASSESSMENT AND PLAN: 1.  Cholecystitis, cholelithiasis  I discussed with the patient the indications and risks of gall bladder surgery.  The primary risks of gall bladder surgery include, but are not limited to, bleeding, infection, common bile duct injury, and open surgery.  There is also the risk that the patient may have continued symptoms after surgery.  We discussed the typical post-operative recovery course. I tried to answer the patient's questions.  2.  HTN x 20 years 3.  DM  Glucose - 166 - 01/14/2016 4.  Anxiety 5.  Nepholithiasis - right UPJ - no hydro 6.  Morbid obesity - BMI - 40 7.  Back trouble - DJD  She has seen Dr. Algis Downs in the past.  No chief complaint on file.  REFERRING PHYSICIAN: Oletha Blend, MD  HISTORY OF PRESENT ILLNESS: Kathy [name] is a 60 y.o. (DOB: 10-08-1955)  white  female whose primary care physician is Oletha Blend, MD ([facility]).    Ms. Gehrig developed epigastric pain which began on 01/14/2016.  The pain has lateralized to the right abdomen.  She went to the [facility] ER - a CT scan showed a right UPJ kidney stone and she was sent to see Dr. Ranelle Oyster today.  She said that she has symptomatic kidney stones every 20 years.   But she did not think that this felt like a kidney stone.  She was admitted today by Dr. Annabell Howells with the concern of gall bladder or pancreas disease.  Her mother had gall bladder disease.  But she has had no chronic GI issues.   She has no history of stomach disease.  No history of liver disease. No history of pancreas disease.  No history of colon disease.  She had a hysterectomy in 1998 for fibroids.  [place] of abdomen - 01/15/2016 - multiple stones, thickened gall bladder wall, sludge, right UPJ calculus - no hydro WBC - 18,700 - 01/15/2016   Past Medical History  Diagnosis Date  . Hypertension   . Diabetes mellitus without complication (HCC)   .  Anxiety   . Hyperlipidemia   . History of kidney stones       Past Surgical History  Procedure Laterality Date  . Abdominal hysterectomy    . Ureteroscopy        Current Facility-Administered Medications  Medication Dose Route Frequency Provider Last Rate Last Dose  . acetaminophen (TYLENOL) tablet 650 mg  650 mg Oral Q4H PRN Bjorn Pippin, MD      . bisacodyl (DULCOLAX) suppository 10 mg  10 mg Rectal Daily PRN Bjorn Pippin, MD      . cefTRIAXone (ROCEPHIN) 2 g in dextrose 5 % 50 mL IVPB  2 g Intravenous Q24H Rolly Salter, MD      . dextrose 5 % and 0.45 % NaCl with KCl 20 mEq/L infusion   Intravenous Continuous Bjorn Pippin, MD 100 mL/hr at 01/15/16 1633    . diphenhydrAMINE (BENADRYL) injection 12.5-25 mg  12.5-25 mg Intravenous Q6H PRN Bjorn Pippin, MD       Or  . diphenhydrAMINE (BENADRYL) 12.5 MG/5ML elixir 12.5-25 mg  12.5-25 mg Oral Q6H PRN Bjorn Pippin, MD      . famotidine (PEPCID) IVPB 20 mg premix  20 mg Intravenous Q12H Rolly Salter, MD      . insulin aspart (novoLOG) injection 0-9 Units  0-9 Units  Subcutaneous Q4H Rolly SalterPranav M Patel, MD   1 Units at 01/15/16 1633  . magnesium hydroxide (MILK OF MAGNESIA) suspension 30 mL  30 mL Oral Daily PRN Bjorn PippinJohn Wrenn, MD      . morphine 2 MG/ML injection 2-4 mg  2-4 mg Intravenous Q2H PRN Bjorn PippinJohn Wrenn, MD   4 mg at 01/15/16 1633  . ondansetron (ZOFRAN) injection 4 mg  4 mg Intravenous Q4H PRN Bjorn PippinJohn Wrenn, MD      . oxyCODONE (Oxy IR/ROXICODONE) immediate release tablet 5 mg  5 mg Oral Q4H PRN Bjorn PippinJohn Wrenn, MD      . zolpidem [facility](AMBIEN) tablet 5 mg  5 mg Oral QHS PRN Bjorn PippinJohn Wrenn, MD          Allergies  Allergen Reactions  . Atorvastatin Other (See Comments)    intolerance  . Rosuvastatin Other (See Comments)    intolerance  . Simvastatin Other (See Comments)    intolerance  . Dexamethasone Rash    REVIEW OF SYSTEMS: Skin:  No history of rash.  No history of abnormal moles. Infection:  No history of hepatitis or HIV.  No history of  MRSA. Neurologic:  No history of stroke.  No history of seizure.  No history of headaches. Cardiac:  HTN x 20 years.  No cardiac eval Pulmonary:  She had pneumonia last year, but was not hospitalized.  Endocrine:  DM x 8 years.  On pills only.  No thyroid disease. Gastrointestinal:  See HPI. Urologic:  She has a kidney stone attack every 20 years. Musculoskeletal:  DJD of back - has seen Dr. Algis DownsE. Paul Hematologic:  No bleeding disorder.  No history of anemia.  Not anticoagulated. Psycho-social:  The patient is oriented.   The patient has no obvious psychologic or social impairment to understanding our conversation and plan.  SOCIAL and FAMILY HISTORY: Married. Has 2 children:  Ages 4636 and 7439.  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  PHYSICAL EXAM: BP 128/69 mmHg  Pulse 95  Temp(Src) 98.2 F (36.8 C) (Oral)  Resp 18  Ht 5\' 2"  (1.575 m)  Wt 99.655 kg (219 lb 11.2 oz)  BMI 40.17 kg/m2  SpO2 96%  General: Obese WF who is alert.  She looks flushed. HEENT: Normal. Pupils equal. Neck: Supple. No mass.  No thyroid mass. Lymph Nodes:  No supraclavicular or cervical nodes. Lungs: Clear to auscultation and symmetric breath sounds. Heart:  RRR. No murmur or rub.  Abdomen: Soft.  No hernia. Normal bowel sounds.  Pfannenstiel scar.  Tender with guarding in the RUQ. Rectal: Not done. Extremities:  Good strength and ROM  in upper and lower extremities. Neurologic:  Grossly intact to motor and sensory function. Psychiatric: Has normal mood and affect. Behavior is normal.   DATA REVIEWED: Epic notes.  Ovidio Kinavid Breccan Galant, MD,  [facility] [address]  [address]   [place], [place]    [id number] Phone:  [phone] FAX:  [phone]

## 2016-01-15 NOTE — H&P (Signed)
CC: I have kidney stones.  HPI: Kathy [name] is a 60 year-old female patient who is here for renal calculi.  The problem is on the right side. She first stated noticing pain on approximately 01/14/2016. This is not her first kidney stone. Her first stone was approximately 01/04/1976. She has had 2 stones prior to getting this one. She is currently having flank pain, fever, and chills. She denies having back pain, groin pain, nausea, and vomiting.   She has had ureteroscopy for treatment of her stones in the past.   She had the onset yesterday morning of epigastric area burning. She then had left upper quadrant pain. The pain is severe. She has had no hematuria. She has no nausea or vomiting.      ALLERGIES: dexamethasone - Skin Rash, tachycardia    MEDICATIONS: Lisinopril 20 mg tablet  Metformin Hcl 500 mg tablet  Zyrtec  Maxzide-25 Mg 37.5 mg-25 mg tablet  Tiazac 360 mg capsule, extended release  Zetia 10 mg tablet     GU PSH: Hysterectomy    NON-GU PSH: None   GU PMH: Kidney Stone      PMH Notes: diabetes   NON-GU PMH: Anxiety disorder, unspecified Essential (primary) hypertension Pure hypercholesterolemia, unspecified    FAMILY HISTORY: brain aneurysm - Father Breast Cancer - Mother Kidney Stones - Son, Father   SOCIAL HISTORY: Marital Status: Married Patient has never smoked.  Drinks 1 drink per week.  Drinks 1 caffeinated drink per day. 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     Notes: 2 sons    REVIEW OF SYSTEMS:    GU Review Female:   Patient reports get up at night to urinate and leakage of urine. Patient denies stream starts and stops, currently pregnant, trouble starting your stream, frequent urination, burning /pain with urination, hard to postpone urination, and have to strain to urinate.  Gastrointestinal (Upper):   Patient denies nausea, vomiting, and indigestion/ heartburn.  Gastrointestinal (Lower):   Patient denies diarrhea and constipation.   Constitutional:   Patient denies fever, night sweats, weight loss, and fatigue.  Skin:   Patient denies skin rash/ lesion and itching.  Eyes:   Patient denies blurred vision and double vision.  Ears/ Nose/ Throat:   Patient denies sore throat and sinus problems.  Hematologic/Lymphatic:   Patient denies swollen glands and easy bruising.  Cardiovascular:   Patient denies leg swelling and chest pains.  Respiratory:   Patient denies cough and shortness of breath.  Endocrine:   Patient denies excessive thirst.  Musculoskeletal:   Patient reports back pain. Patient denies joint pain.  Neurological:   Patient denies headaches and dizziness.  Psychologic:   Patient denies depression and anxiety.   Notes: indigestion/heartburn   VITAL SIGNS:    Weight: 219 lb/99.3 kg   Height/Length: 62 in / 157 cm   BP: 126/85 mmHg   Heart Rate: 109 /min   Temp: 98.5 F / 37 C   BMI: 40.1      MULTI-SYSTEM PHYSICAL EXAMINATION:    Constitutional: Obese. No physical deformities. Normally developed. Good grooming. In obvious pain  Neck: Neck symmetrical, not swollen. Normal tracheal position.  Respiratory: No labored breathing, no use of accessory muscles.   Cardiovascular: Normal temperature, normal extremity pulses, no swelling, no varicosities.  Lymphatic: No enlargement, no tenderness neck lymph nodes.  Skin: No paleness, no jaundice, no cyanosis. No lesion, no ulcer, no rash.  Neurologic / Psychiatric: Oriented to time, oriented to place, oriented to person. No depression,  no anxiety, no agitation.  Gastrointestinal: Abdominal tenderness in the epigastric, RUQ and bilateral CVAT. with guarding and rebound. No masses or HSM are noted. No hernias are noted  Ears, Nose, Mouth, and Throat: Left ear no scars, no lesions, no masses. Right ear no scars, no lesions, no masses. Nose no scars, no lesions, no masses. Normal hearing. Normal lips.  Musculoskeletal: Normal gait and station of head and neck.   PAST  DATA REVIEWED:  Source Of History:  Patient  Lab Test Review:   CMP, CBC with Diff  Urine Test Review:   Urinalysis  X-Ray Review: C.T. Urogram: Reviewed Films. Reviewed Report.     PROCEDURES:          Urinalysis - 81003 Dipstick Dipstick Cont'd  Specimen: Voided Bilirubin: Neg  Color: Yellow Ketones: Neg  Appearance: Clear Blood: Neg  Specific Gravity: 1.020 Protein: Neg  pH: 7.0 Urobilinogen: 0.2  Glucose: Neg Nitrites: Neg    Leukocyte Esterase: Neg         Phenergan  - 96372, J2550 Qty: 25 Adm. By: Rene Paci  Unit: mg Lot No [id number]  Route: IM Exp. Date 07/05/2017  Freq: None Mfgr.:   Site: Right Buttock   ASSESSMENT:      ICD-10 Details  1 GU:   Kidney Stone - N20.0   2   Right upper quadrant pain - R10.11   3   Epigastric pain - R10.13    PLAN:           Schedule Procedure: Unspecified Date at [facility] - [phone] - Morphine  (Morphine Per 10 Mg) - [id number], W1191  Procedure: Unspecified Date at [facility] - 29199 - Phenergan  (Phenergan Per 50 Mg) - J2550, [id number]          Document Letter(s):  Created for Patient: Clinical Summary         Notes:   She has epigastric and right upper quadrant pain with bilateral CVAT right > left but her stone was non-obstructing on CT and her UA's have been clear. I am concerned that she may have pancreatitis or cholecystitis as a cause of her pain.   I am going to admit her for pain control and further evaluation with abdominal US, additional labs and a Hospitalist consult.

## 2016-01-15 NOTE — Consult Note (Addendum)
[facility] Initial Consultation Note   Patient Name: Kathy Bellowatricia M Tuite    ZOX:[id number] PCP: Oletha BlendWITTEN, BOBBY D, MD     DOB: September 05, 1955  DOA: 01/15/2016 DOS: the patient was seen and examined on 01/15/2016   Referring physician: Bjorn PippinJohn Wrenn, MD  Reason for consult: Right upper quadrant pain epigastric pain  HPI: Kathy [name] is a 60 y.o. female with Past medical history of hypertension, type 2 diabetes mellitus, dyslipidemia, anxiety, renal stone. Patient is coming from home The patient presented with complaints of pain in the epigastric region ongoing since 01/14/2016. She complains about this pain as a sharp constant pain. The pain radiates to right upper quadrant as well as to her back. Patient was seen in ER with this complaint. Her workup with a CT scan was unremarkable and therefore patient was discharged home with urology follow-up. Patient went to urologist office and due to worsening of her symptoms patient was brought to hospital for admission. At the time of my evaluation patient continues to complain of pain in the epigastric region as well as right upper quadrant region radiating to her side. She denies any fever or chills no nausea no vomiting no chest pain or shortness of breath no dizziness no lightheadedness. She denies any diarrhea or constipation she denies any burning urination or active bleeding or any change in the skin or rash. She denies any leg swelling or leg cramps. She denies any recent hospitalization procedures or surgeries. She mentions that she has occasional indigestion and she takes Zantac but it did not improve this time. She denies any recent change in the medications as well.  Review of Systems: as mentioned in the history of present illness.  A Comprehensive review of the other systems is negative.  Past Medical History  Diagnosis Date  . Hypertension   . Diabetes mellitus without complication (HCC)   . Anxiety   . Hyperlipidemia   . History  of kidney stones    Past Surgical History  Procedure Laterality Date  . Abdominal hysterectomy    . Ureteroscopy     Social History:  reports that she has never smoked. She has never used smokeless tobacco. She reports that she does not drink alcohol or use illicit drugs.  Allergies  Allergen Reactions  . Dexamethasone Rash    Family History  Problem Relation Age of Onset  . Breast cancer Mother   . Aneurysm Father     brain aneurysm  . Kidney Stones Father     Prior to Admission medications   Medication Sig Start Date End Date Taking? Authorizing Provider  diltiazem (TIAZAC) 300 MG 24 hr capsule Take 300 mg by mouth daily.    Historical Provider, MD  ezetimibe (ZETIA) 10 MG tablet Take 10 mg by mouth daily.    Historical Provider, MD  HYDROcodone-acetaminophen (NORCO/VICODIN) 5-325 MG per tablet Take 2 tablets by mouth every 4 (four) hours as needed. Patient not taking: Reported on 01/14/2016 11/21/13   Janne NapoleonHope M Neese, NP  ketorolac (TORADOL) 10 MG tablet Take 1 tablet (10 mg total) by mouth every 6 (six) hours as needed. 01/14/16   Eyvonne MechanicJeffrey Hedges, PA-C  lisinopril (PRINIVIL,ZESTRIL) 20 MG tablet Take 20 mg by mouth daily.    Historical Provider, MD  metFORMIN (GLUCOPHAGE) 1000 MG tablet Take 1,000 mg by mouth daily.     Historical Provider, MD  ondansetron (ZOFRAN) 4 MG tablet Take 1 tablet (4 mg total) by mouth every 6 (six) hours. 01/14/16   Tinnie GensJeffrey  Hedges, PA-C  oxyCODONE-acetaminophen (PERCOCET/ROXICET) 5-325 MG tablet Take 1 tablet by mouth every 4 (four) hours as needed for severe pain. 01/14/16   Eyvonne Mechanic, PA-C  tamsulosin (FLOMAX) 0.4 MG CAPS capsule Take 1 capsule (0.4 mg total) by mouth daily. 01/14/16   Eyvonne Mechanic, PA-C  triamterene-hydrochlorothiazide (MAXZIDE-25) 37.5-25 MG per tablet Take 1 tablet by mouth daily.    Historical Provider, MD    Physical Exam: Filed Vitals:   01/15/16 1332  BP: 128/69  Pulse: 95  Temp: 98.2 F (36.8 C)  TempSrc: Oral  Resp:  18  SpO2: 96%    General: Alert, Awake and Oriented to Time, Place and Person. Appear in mild distress Eyes: PERRL ENT: Oral Mucosa clear moist. Neck: no JVD Cardiovascular: S1 and S2 Present, no Murmur, Peripheral Pulses Present Respiratory: Bilateral Air entry equal and Decreased,  Clear to Auscultation, no Crackles, no wheezes Abdomen: Bowel Sound present, Soft and upper abdominal diffuse tenderness Skin: no Rash Extremities: no Pedal edema, no calf tenderness Neurologic: Grossly no focal neuro deficit.  Labs:  CBC:  Recent Labs Lab 01/14/16 1752 01/15/16 1352  WBC 11.8* 18.7*  NEUTROABS 10.1* 15.7*  HGB 13.6 13.7  HCT 42.1 39.6  MCV 90.1 88.0  PLT 306 344   Basic Metabolic Panel:  Recent Labs Lab 01/14/16 1752  NA 136  K 4.0  CL 101  CO2 25  GLUCOSE 166*  BUN 16  CREATININE 0.93  CALCIUM 9.9   Liver Function Tests:  Recent Labs Lab 01/14/16 1752  AST 21  ALT 17  ALKPHOS 58  BILITOT 0.4  PROT 6.9  ALBUMIN 4.1    Recent Labs Lab 01/14/16 1752  LIPASE 33   No results for input(s): AMMONIA in the last 168 hours.  Cardiac Enzymes: No results for input(s): CKTOTAL, CKMB, CKMBINDEX, TROPONINI in the last 168 hours. No results for input(s): PROBNP in the last 8760 hours.  CBG: No results for input(s): GLUCAP in the last 168 hours.  Radiological Exams: Ct Abdomen Pelvis W Contrast  01/14/2016  CLINICAL DATA:  Acute onset of epigastric abdominal pain. Initial encounter. EXAM: CT ABDOMEN AND PELVIS WITH CONTRAST TECHNIQUE: Multidetector CT imaging of the abdomen and pelvis was performed using the standard protocol following bolus administration of intravenous contrast. CONTRAST:  ISOVUE-300 IOPAMIDOL (ISOVUE-300) INJECTION 61% COMPARISON:  MRI of the lumbar spine performed 01/24/2009, and CT of the abdomen and pelvis from 08/22/2004 FINDINGS: Minimal bibasilar atelectasis is noted. The liver and spleen are unremarkable in appearance. The  gallbladder is within normal limits. The pancreas and adrenal glands are unremarkable. A 3.5 cm cyst is noted at the posterior aspect of the left kidney. Minimal nonspecific perinephric stranding is noted bilaterally. A nonobstructing 9 mm stone is noted at the right renal pelvis. No obstructing ureteral stones are identified. There is no evidence of hydronephrosis. No free fluid is identified. The small bowel is unremarkable in appearance. The stomach is within normal limits. No acute vascular abnormalities are seen. Minimal calcification is seen along the abdominal aorta and its branches. The appendix is normal in caliber, without evidence of appendicitis. Minimal diverticulosis is noted along the sigmoid colon, without evidence of diverticulitis. The bladder is mildly distended and grossly unremarkable. The patient is status post hysterectomy. No suspicious adnexal masses are seen. No inguinal lymphadenopathy is seen. No acute osseous abnormalities are identified. IMPRESSION: 1. No acute abnormality seen to explain the patient's symptoms. 2. Nonobstructing 9 mm stone at the right renal pelvis. No  evidence of hydronephrosis. 3. Left renal cyst noted. 4. Minimal diverticulosis along the sigmoid colon, without evidence of diverticulitis. Electronically Signed   By: Roanna Raider M.D.   On: 01/14/2016 19:21   EKG: Pending ordered.  Assessment/Plan Principal Problem:   Abdominal pain, epigastric Patient presents with epigastric abdominal pain. Symptoms are described as continuous sharp burning pain. She described it as a bad case of indigestion. CT scan of the abdomen yesterday did not show any significant findings regarding liver, gallbladder, pancreas, spleen. There was no also significant comment regarding lower chest.  With this at present I would treat the patient as an acute intra-abdominal process. I will keep the patient nothing by mouth, check lactic acid, lipase, LFT, CMP. I would provide  Protonix every 12 hours. Zofran when necessary. Gentle IV hydration. Continue pain medications per urology. Agree with checking ultrasound abdomen which will help in identifying gallbladder as well as liver pathology. Check x-ray abdomen as well. Also checking x-ray chest as well as troponin and EKG to rule out acute cardiac process.  We'll consult GI or surgery specialist depending on the workup after identifying etiology.  Active Problems:   Right nephrolithiasis Continue management per primary urology.    Diabetes mellitus without complication (HCC) Placing the patient on sliding scale every 4 hours. Holding oral hypoglycemic agents.    Hypertension Holding antihypertensive medications at present.    Hyperlipidemia Holding Zetia at present.  Family Communication: family was present at bedside, at the time of interview. The pt provided permission to discuss medical plan with the family. Opportunity was given to ask question and all questions were answered satisfactorily.   Primary team communication: Discussed with Dr. Annabell Howells  Thank you very much for involving [place] in care of your patient.  We will take over patient's care,   Addendum: Ultrasound abdomen shows evidence of early acute cholecystitis with mild gallbladder wall thickening. General surgery consulted. Patient will remain nothing by mouth after midnight. Starting the patient on IV ceftriaxone.  Lipase, LFTs are stable. Mild elevation of bilirubin.  EKG shows T-wave inversions in lead 3 and lead aVF, Q waves suggest early or infarct. Troponins are negative.   Lynden Oxford 5:47 PM 01/15/2016    Author: Lynden Oxford, MD [facility] Pager: [phone] 01/15/2016 3:37 PM    If 7PM-7AM, please contact night-coverage [email or web address] Password [email or web address]

## 2016-01-16 ENCOUNTER — Observation Stay (HOSPITAL_COMMUNITY): Payer: BLUE CROSS/BLUE SHIELD | Admitting: Certified Registered Nurse Anesthetist

## 2016-01-16 ENCOUNTER — Encounter (HOSPITAL_COMMUNITY)
Admission: AD | Disposition: A | Discharge status: Home / Self Care | Payer: Self-pay | Source: Ambulatory Visit | Attending: Internal Medicine

## 2016-01-16 ENCOUNTER — Observation Stay (HOSPITAL_COMMUNITY): Payer: BLUE CROSS/BLUE SHIELD

## 2016-01-16 ENCOUNTER — Encounter (HOSPITAL_COMMUNITY): Payer: Self-pay | Admitting: Certified Registered"

## 2016-01-16 DIAGNOSIS — I10 Essential (primary) hypertension: Secondary | ICD-10-CM | POA: Diagnosis not present

## 2016-01-16 DIAGNOSIS — Z841 Family history of disorders of kidney and ureter: Secondary | ICD-10-CM | POA: Diagnosis not present

## 2016-01-16 DIAGNOSIS — Z7984 Long term (current) use of oral hypoglycemic drugs: Secondary | ICD-10-CM | POA: Diagnosis not present

## 2016-01-16 DIAGNOSIS — E119 Type 2 diabetes mellitus without complications: Secondary | ICD-10-CM | POA: Diagnosis not present

## 2016-01-16 DIAGNOSIS — K810 Acute cholecystitis: Secondary | ICD-10-CM

## 2016-01-16 DIAGNOSIS — Z79899 Other long term (current) drug therapy: Secondary | ICD-10-CM | POA: Diagnosis not present

## 2016-01-16 DIAGNOSIS — E785 Hyperlipidemia, unspecified: Secondary | ICD-10-CM | POA: Diagnosis not present

## 2016-01-16 DIAGNOSIS — K819 Cholecystitis, unspecified: Secondary | ICD-10-CM | POA: Diagnosis not present

## 2016-01-16 DIAGNOSIS — R109 Unspecified abdominal pain: Secondary | ICD-10-CM | POA: Diagnosis not present

## 2016-01-16 DIAGNOSIS — N200 Calculus of kidney: Secondary | ICD-10-CM | POA: Diagnosis not present

## 2016-01-16 DIAGNOSIS — K8066 Calculus of gallbladder and bile duct with acute and chronic cholecystitis without obstruction: Secondary | ICD-10-CM | POA: Diagnosis not present

## 2016-01-16 DIAGNOSIS — R1013 Epigastric pain: Secondary | ICD-10-CM | POA: Diagnosis not present

## 2016-01-16 DIAGNOSIS — K8012 Calculus of gallbladder with acute and chronic cholecystitis without obstruction: Secondary | ICD-10-CM | POA: Diagnosis not present

## 2016-01-16 DIAGNOSIS — Z6841 Body Mass Index (BMI) 40.0 and over, adult: Secondary | ICD-10-CM | POA: Diagnosis not present

## 2016-01-16 HISTORY — PX: CHOLECYSTECTOMY: SHX55

## 2016-01-16 LAB — CBC WITH DIFFERENTIAL/PLATELET
Basophils Absolute: 0 10*3/uL (ref 0.0–0.1)
Basophils Relative: 0 %
Eosinophils Absolute: 0 10*3/uL (ref 0.0–0.7)
Eosinophils Relative: 0 %
HCT: 36.1 % (ref 36.0–46.0)
Hemoglobin: 12.1 g/dL (ref 12.0–15.0)
Lymphocytes Relative: 11 %
Lymphs Abs: 2.1 10*3/uL (ref 0.7–4.0)
MCH: 30 pg (ref 26.0–34.0)
MCHC: 33.5 g/dL (ref 30.0–36.0)
MCV: 89.6 fL (ref 78.0–100.0)
Monocytes Absolute: 1.8 10*3/uL — ABNORMAL HIGH (ref 0.1–1.0)
Monocytes Relative: 9 %
Neutro Abs: 15.9 10*3/uL — ABNORMAL HIGH (ref 1.7–7.7)
Neutrophils Relative %: 80 %
Platelets: 277 10*3/uL (ref 150–400)
RBC: 4.03 MIL/uL (ref 3.87–5.11)
RDW: 14 % (ref 11.5–15.5)
WBC: 19.9 10*3/uL — ABNORMAL HIGH (ref 4.0–10.5)

## 2016-01-16 LAB — COMPREHENSIVE METABOLIC PANEL
ALT: 34 U/L (ref 14–54)
AST: 26 U/L (ref 15–41)
Albumin: 3.9 g/dL (ref 3.5–5.0)
Alkaline Phosphatase: 58 U/L (ref 38–126)
Anion gap: 7 (ref 5–15)
BUN: 12 mg/dL (ref 6–20)
CO2: 28 mmol/L (ref 22–32)
Calcium: 8.9 mg/dL (ref 8.9–10.3)
Chloride: 99 mmol/L — ABNORMAL LOW (ref 101–111)
Creatinine, Ser: 0.97 mg/dL (ref 0.44–1.00)
GFR calc Af Amer: >60 mL/min (ref >60)
GFR calc non Af Amer: >60 mL/min (ref >60)
Glucose, Bld: 162 mg/dL — ABNORMAL HIGH (ref 65–99)
Potassium: 4 mmol/L (ref 3.5–5.1)
Sodium: 134 mmol/L — ABNORMAL LOW (ref 135–145)
Total Bilirubin: 0.9 mg/dL (ref 0.3–1.2)
Total Protein: 6.8 g/dL (ref 6.5–8.1)

## 2016-01-16 LAB — SURGICAL PCR SCREEN
MRSA, PCR: NEGATIVE
Staphylococcus aureus: NEGATIVE

## 2016-01-16 LAB — GLUCOSE, CAPILLARY
Glucose-Capillary: 139 mg/dL — ABNORMAL HIGH (ref 65–99)
Glucose-Capillary: 146 mg/dL — ABNORMAL HIGH (ref 65–99)
Glucose-Capillary: 146 mg/dL — ABNORMAL HIGH (ref 65–99)
Glucose-Capillary: 147 mg/dL — ABNORMAL HIGH (ref 65–99)
Glucose-Capillary: 150 mg/dL — ABNORMAL HIGH (ref 65–99)
Glucose-Capillary: 152 mg/dL — ABNORMAL HIGH (ref 65–99)
Glucose-Capillary: 166 mg/dL — ABNORMAL HIGH (ref 65–99)

## 2016-01-16 LAB — PROTIME-INR
INR: 1.26 (ref 0.00–1.49)
Prothrombin Time: 15.9 seconds — ABNORMAL HIGH (ref 11.6–15.2)

## 2016-01-16 LAB — TROPONIN I: Troponin I: <0.03 ng/mL (ref <0.031)

## 2016-01-16 LAB — MAGNESIUM: Magnesium: 1.5 mg/dL — ABNORMAL LOW (ref 1.7–2.4)

## 2016-01-16 SURGERY — LAPAROSCOPIC CHOLECYSTECTOMY WITH INTRAOPERATIVE CHOLANGIOGRAM
Anesthesia: General | Site: Abdomen

## 2016-01-16 MED ORDER — SUGAMMADEX SODIUM 200 MG/2ML IV SOLN
INTRAVENOUS | Status: DC | PRN
  Administered 2016-01-16: 200 mg via INTRAVENOUS

## 2016-01-16 MED ORDER — HYDROMORPHONE HCL 1 MG/ML IJ SOLN
INTRAMUSCULAR | Status: DC | PRN
  Administered 2016-01-16 (×2): 1 mg via INTRAVENOUS

## 2016-01-16 MED ORDER — SUCCINYLCHOLINE CHLORIDE 20 MG/ML IJ SOLN
INTRAMUSCULAR | Status: DC | PRN
  Administered 2016-01-16: 100 mg via INTRAVENOUS

## 2016-01-16 MED ORDER — 0.9 % SODIUM CHLORIDE (POUR BTL) OPTIME
TOPICAL | Status: DC | PRN
  Administered 2016-01-16: 1000 mL

## 2016-01-16 MED ORDER — ROCURONIUM BROMIDE 100 MG/10ML IV SOLN
INTRAVENOUS | Status: DC | PRN
  Administered 2016-01-16: 5 mg via INTRAVENOUS
  Administered 2016-01-16: 45 mg via INTRAVENOUS

## 2016-01-16 MED ORDER — ALPRAZOLAM 0.5 MG PO TABS
0.5000 mg | ORAL_TABLET | Freq: Every evening | ORAL | Status: DC | PRN
  Filled 2016-01-16: qty 1

## 2016-01-16 MED ORDER — IOPAMIDOL (ISOVUE-300) INJECTION 61%
INTRAVENOUS | Status: AC
  Filled 2016-01-16: qty 50

## 2016-01-16 MED ORDER — FENTANYL CITRATE (PF) 100 MCG/2ML IJ SOLN
INTRAMUSCULAR | Status: AC
  Filled 2016-01-16: qty 2

## 2016-01-16 MED ORDER — PROPOFOL 10 MG/ML IV BOLUS
INTRAVENOUS | Status: AC
  Filled 2016-01-16: qty 20

## 2016-01-16 MED ORDER — MIDAZOLAM HCL 5 MG/5ML IJ SOLN
INTRAMUSCULAR | Status: DC | PRN
  Administered 2016-01-16: 2 mg via INTRAVENOUS

## 2016-01-16 MED ORDER — ROCURONIUM BROMIDE 100 MG/10ML IV SOLN
INTRAVENOUS | Status: AC
  Filled 2016-01-16: qty 1

## 2016-01-16 MED ORDER — LACTATED RINGERS IV SOLN: INTRAVENOUS | Status: DC

## 2016-01-16 MED ORDER — LACTATED RINGERS IV SOLN
INTRAVENOUS | Status: DC
Start: 2016-01-16 — End: 2016-01-16

## 2016-01-16 MED ORDER — LIDOCAINE HCL (CARDIAC) 20 MG/ML IV SOLN
INTRAVENOUS | Status: DC | PRN
  Administered 2016-01-16: 50 mg via INTRAVENOUS

## 2016-01-16 MED ORDER — HYDROMORPHONE HCL 1 MG/ML IJ SOLN
0.2500 mg | INTRAMUSCULAR | Status: DC | PRN
  Administered 2016-01-16 (×2): 0.5 mg via INTRAVENOUS

## 2016-01-16 MED ORDER — OXYCODONE-ACETAMINOPHEN 5-325 MG PO TABS
1.0000 | ORAL_TABLET | ORAL | Status: DC | PRN
  Administered 2016-01-17 (×2): 1 via ORAL
  Filled 2016-01-16 (×3): qty 1

## 2016-01-16 MED ORDER — PROPOFOL 10 MG/ML IV BOLUS
INTRAVENOUS | Status: DC | PRN
  Administered 2016-01-16: 180 mg via INTRAVENOUS

## 2016-01-16 MED ORDER — BUPIVACAINE-EPINEPHRINE 0.25% -1:200000 IJ SOLN
INTRAMUSCULAR | Status: AC
  Filled 2016-01-16: qty 1

## 2016-01-16 MED ORDER — PROMETHAZINE HCL 25 MG/ML IJ SOLN
INTRAMUSCULAR | Status: AC
  Filled 2016-01-16: qty 1

## 2016-01-16 MED ORDER — SODIUM CHLORIDE 0.9 % IV SOLN
INTRAVENOUS | Status: DC | PRN
  Administered 2016-01-16: 5 mL

## 2016-01-16 MED ORDER — FENTANYL CITRATE (PF) 100 MCG/2ML IJ SOLN
INTRAMUSCULAR | Status: DC | PRN
  Administered 2016-01-16: 100 ug via INTRAVENOUS

## 2016-01-16 MED ORDER — MAGNESIUM SULFATE 2 GM/50ML IV SOLN
2.0000 g | Freq: Once | INTRAVENOUS | Status: AC
  Administered 2016-01-16: 2 g via INTRAVENOUS
  Filled 2016-01-16: qty 50

## 2016-01-16 MED ORDER — PROPOFOL 10 MG/ML IV BOLUS
INTRAVENOUS | Status: AC
Start: 2016-01-16 — End: 2016-01-16
  Filled 2016-01-16: qty 20

## 2016-01-16 MED ORDER — PROMETHAZINE HCL 25 MG/ML IJ SOLN
6.2500 mg | INTRAMUSCULAR | Status: DC | PRN
  Administered 2016-01-16: 6.25 mg via INTRAVENOUS

## 2016-01-16 MED ORDER — LIDOCAINE HCL (CARDIAC) 20 MG/ML IV SOLN
INTRAVENOUS | Status: AC
Start: 2016-01-16 — End: 2016-01-16
  Filled 2016-01-16: qty 5

## 2016-01-16 MED ORDER — LACTATED RINGERS IV SOLN
INTRAVENOUS | Status: DC | PRN
  Administered 2016-01-16 (×2): via INTRAVENOUS

## 2016-01-16 MED ORDER — HYDROMORPHONE HCL 1 MG/ML IJ SOLN
INTRAMUSCULAR | Status: AC
  Filled 2016-01-16: qty 1

## 2016-01-16 MED ORDER — LACTATED RINGERS IR SOLN
Status: DC | PRN
  Administered 2016-01-16 (×2): 1000 mL

## 2016-01-16 MED ORDER — ONDANSETRON HCL 4 MG/2ML IJ SOLN
INTRAMUSCULAR | Status: DC | PRN
  Administered 2016-01-16: 4 mg via INTRAVENOUS

## 2016-01-16 MED ORDER — MIDAZOLAM HCL 2 MG/2ML IJ SOLN
INTRAMUSCULAR | Status: AC
  Filled 2016-01-16: qty 2

## 2016-01-16 SURGICAL SUPPLY — 45 items
APL SKNCLS STERI-STRIP NONHPOA (GAUZE/BANDAGES/DRESSINGS)
APPLIER CLIP 5 13 M/L LIGAMAX5 (MISCELLANEOUS) ×2
APPLIER CLIP ROT 10 11.4 M/L (STAPLE) ×2
APR CLP MED LRG 11.4X10 (STAPLE) ×1
APR CLP MED LRG 5 ANG JAW (MISCELLANEOUS) ×1
BAG SPEC RTRVL 10 TROC 200 (ENDOMECHANICALS) ×1
BAG SPEC RTRVL LRG 6X4 10 (ENDOMECHANICALS)
BENZOIN TINCTURE PRP APPL 2/3 (GAUZE/BANDAGES/DRESSINGS) ×1 IMPLANT
CABLE HIGH FREQUENCY MONO STRZ (ELECTRODE) ×2 IMPLANT
CHLORAPREP W/TINT 26ML (MISCELLANEOUS) ×2 IMPLANT
CHOLANGIOGRAM CATH TAUT (CATHETERS) ×2 IMPLANT
CLIP APPLIE 5 13 M/L LIGAMAX5 (MISCELLANEOUS) IMPLANT
CLIP APPLIE ROT 10 11.4 M/L (STAPLE) IMPLANT
COVER MAYO STAND STRL (DRAPES) ×1 IMPLANT
COVER SURGICAL LIGHT HANDLE (MISCELLANEOUS) ×2 IMPLANT
DECANTER SPIKE VIAL GLASS SM (MISCELLANEOUS) ×2 IMPLANT
DRAPE C-ARM 42X120 X-RAY (DRAPES) ×1 IMPLANT
DRAPE LAPAROSCOPIC ABDOMINAL (DRAPES) ×2 IMPLANT
ELECT REM PT RETURN 9FT ADLT (ELECTROSURGICAL) ×2
ELECTRODE REM PT RTRN 9FT ADLT (ELECTROSURGICAL) ×1 IMPLANT
GLOVE SURG SIGNA 7.5 PF LTX (GLOVE) ×2 IMPLANT
GOWN STRL REUS W/TWL XL LVL3 (GOWN DISPOSABLE) ×6 IMPLANT
HEMOSTAT SURGICEL 4X8 (HEMOSTASIS) IMPLANT
IV CATH 14GX2 1/4 (CATHETERS) ×2 IMPLANT
IV SET EXTENSION CATH 6 NF (IV SETS) ×2 IMPLANT
KIT BASIN OR (CUSTOM PROCEDURE TRAY) ×2 IMPLANT
LIQUID BAND (GAUZE/BANDAGES/DRESSINGS) ×1 IMPLANT
POSITIONER SURGICAL ARM (MISCELLANEOUS) IMPLANT
POUCH RETRIEVAL ECOSAC 10 (ENDOMECHANICALS) IMPLANT
POUCH RETRIEVAL ECOSAC 10MM (ENDOMECHANICALS) ×1
POUCH SPECIMEN RETRIEVAL 10MM (ENDOMECHANICALS) IMPLANT
SCISSORS LAP 5X35 DISP (ENDOMECHANICALS) ×2 IMPLANT
SET IRRIG TUBING LAPAROSCOPIC (IRRIGATION / IRRIGATOR) ×2 IMPLANT
SLEEVE XCEL OPT CAN 5 100 (ENDOMECHANICALS) ×2 IMPLANT
STOPCOCK 4 WAY LG BORE MALE ST (IV SETS) ×2 IMPLANT
STRIP CLOSURE SKIN 1/4X4 (GAUZE/BANDAGES/DRESSINGS) ×1 IMPLANT
SUT MNCRL AB 4-0 PS2 18 (SUTURE) ×2 IMPLANT
SUT VICRYL 0 UR6 27IN ABS (SUTURE) ×1 IMPLANT
TAPE CLOTH 4X10 WHT NS (GAUZE/BANDAGES/DRESSINGS) IMPLANT
TOWEL OR 17X26 10 PK STRL BLUE (TOWEL DISPOSABLE) ×2 IMPLANT
TRAY LAPAROSCOPIC (CUSTOM PROCEDURE TRAY) ×2 IMPLANT
TROCAR BLADELESS OPT 5 100 (ENDOMECHANICALS) ×2 IMPLANT
TROCAR XCEL BLUNT TIP 100MML (ENDOMECHANICALS) ×2 IMPLANT
TROCAR XCEL NON-BLD 11X100MML (ENDOMECHANICALS) ×1 IMPLANT
TUBING INSUF HEATED (TUBING) ×1 IMPLANT

## 2016-01-16 NOTE — Op Note (Signed)
01/15/2016 - 01/16/2016  6:20 PM  PATIENT:  Kathy [name], 60 y.o., female, MRN: [id number]  PREOP DIAGNOSIS:  Cholecystitis, cholelithiasis  POSTOP DIAGNOSIS:   Acute cholecystitis, cholelithiasis (stone impacted in the neck of the gall bladder)  PROCEDURE:   LAPAROSCOPIC CHOLECYSTECTOMY WITH INTRAOPERATIVE CHOLANGIOGRAM  SURGEON:   Ovidio Kin, M.D.  Threasa HeadsGena Fray, M.D.  ANESTHESIA:   general  Anesthesiologist: Ronelle Nigh, MD CRNA: Atilano Median, CRNA  General  ASA: 3E  EBL:  30  ml  BLOOD ADMINISTERED: none  DRAINS: none   LOCAL MEDICATIONS USED:   30 cc 1/4% marcaine  SPECIMEN:   Gall bladder  COUNTS CORRECT:  YES  INDICATIONS FOR PROCEDURE:  Kathy [name] is a 60 y.o. (DOB: Jun 10, 1956) white  female whose primary care physician is Oletha Blend, MD and comes for cholecystectomy.   The indications and risks of the gall bladder surgery were explained to the patient.  The risks include, but are not limited to, infection, bleeding, common bile duct injury and open surgery.  SURGERY:  The patient was taken to OR room #2 at [facility].  The abdomen was prepped with chloroprep.  The patient was given 2 gm Rocephin at the beginning of the operation.   A time out was held and the surgical checklist run.   An infraumbilical incision was made into the abdominal cavity.  A 12 mm Hasson trocar was inserted into the abdominal cavity through the infraumbilical incision and secured with a 0 Vicryl suture.  Three additional trocars were inserted: a 10 mm trocar in the sub-xiphoid location, a 5 mm trocar in the right mid subcostal area, and a 5 mm trocar in the right lateral subcostal area.   The abdomen was explored and the liver, stomach, and bowel that could be seen were unremarkable.   The gall bladder was acutely inflamed and encased in omentum.  It was distended from an impacted gall stone.  I had to decompress the gall bladder.   I grasped the  gall bladder and rotated it cephalad.  Disssection was carried down to the gall bladder/cystic duct junction and the cystic duct isolated.  A clip was placed on the gall bladder side of the cystic duct.   An intra-operative cholangiogram was shot.   The intra-operative cholangiogram was shot using a cut off Taut catheter placed through a 14 gauge angiocath in the RUQ.  The Taut catheter was inserted in the cut cystic duct and secured with an endoclip.  A cholangiogram was shot with 10 cc of 1/2 strength Isoview.  Using fluoroscopy, the cholangiogram showed the flow of contrast into the common bile duct, up the hepatic radicals, and into the duodenum.  There was no mass or obstruction.  This was a normal intra-operative cholangiogram.   The Taut catheter was removed.  The cystic duct was tripley endoclipped and the cystic artery was identified and clipped.  The gall bladder was bluntly and sharpley dissected from the gall bladder bed.   After the gall bladder was removed from the liver, the gall bladder bed and [place] were inspected.  There was no bleeding or bile leak.  The gall bladder was placed in a Ecco bag and delivered through the umbilicus. She had two large stones - 3 to 4 cm in diameter.  I had to crush the stones to get them out. The abdomen was irrigated with 2,000 cc saline.   The trocars were then removed.  I infiltrated 30 cc of 1/4% Marcaine into the incisions.  The umbilical port closed with a 0 Vicryl suture and the skin closed with 4-0 Monocryl.  The skin was painted with LiquidBand.  The patient's sponge and needle count were correct.  The patient was transported to the RR in good condition.  Ovidio Kinavid Joncarlo Friberg, MD, [facility] Pager: [phone] Office phone:  [phone]

## 2016-01-16 NOTE — Anesthesia Postprocedure Evaluation (Signed)
Anesthesia Post Note  Patient: Kathy [name]  Procedure(s) Performed: Procedure(s) (LRB): LAPAROSCOPIC CHOLECYSTECTOMY WITH INTRAOPERATIVE CHOLANGIOGRAM (N/A)  Patient location during evaluation: PACU Anesthesia Type: General Level of consciousness: awake and alert Pain management: pain level controlled Vital Signs Assessment: post-procedure vital signs reviewed and stable Respiratory status: spontaneous breathing, nonlabored ventilation, respiratory function stable and patient connected to nasal cannula oxygen Cardiovascular status: blood pressure returned to baseline and stable Postop Assessment: no signs of nausea or vomiting Anesthetic complications: no    Last Vitals:  Filed Vitals:   01/16/16 1845 01/16/16 1900  BP: 118/68 126/75  Pulse: 94 90  Temp:    Resp: 20 15    Last Pain:  Filed Vitals:   01/16/16 1905  PainSc: 1                  Delontae Lamm L

## 2016-01-16 NOTE — Anesthesia Preprocedure Evaluation (Addendum)
Anesthesia Evaluation  Patient identified by MRN, date of birth, ID band Patient awake    Reviewed: Allergy & Precautions, H&P , NPO status , Patient's Chart, lab work & pertinent test results  Airway Mallampati: II  TM Distance: >3 FB Neck ROM: full    Dental no notable dental hx. (+) Dental Advisory Given, Teeth Intact   Pulmonary neg pulmonary ROS,    Pulmonary exam normal breath sounds clear to auscultation       Cardiovascular Exercise Tolerance: Good hypertension, Pt. on medications Normal cardiovascular exam Rhythm:regular Rate:Normal     Neuro/Psych negative neurological ROS  negative psych ROS   GI/Hepatic negative GI ROS, Neg liver ROS,   Endo/Other  diabetes, Well Controlled, Type 2, Oral Hypoglycemic AgentsMorbid obesity  Renal/GU negative Renal ROS  negative genitourinary   Musculoskeletal   Abdominal (+) + obese,   Peds  Hematology negative hematology ROS (+)   Anesthesia Other Findings   Reproductive/Obstetrics negative OB ROS                            Anesthesia Physical Anesthesia Plan  ASA: III  Anesthesia Plan: General   Post-op Pain Management:    Induction: Intravenous  Airway Management Planned: Oral ETT  Additional Equipment:   Intra-op Plan:   Post-operative Plan: Extubation in OR  Informed Consent: I have reviewed the patients History and Physical, chart, labs and discussed the procedure including the risks, benefits and alternatives for the proposed anesthesia with the patient or authorized representative who has indicated his/her understanding and acceptance.   Dental Advisory Given  Plan Discussed with: CRNA and Surgeon  Anesthesia Plan Comments:         Anesthesia Quick Evaluation  

## 2016-01-16 NOTE — Anesthesia Procedure Notes (Signed)
Procedure Name: Intubation Date/Time: 01/16/2016 4:40 PM Performed by: Enriqueta ShutterWILLIFORD, Karissa Meenan D Pre-anesthesia Checklist: Patient identified, Emergency Drugs available, Suction available and Patient being monitored Patient Re-evaluated:Patient Re-evaluated prior to inductionOxygen Delivery Method: Circle system utilized Preoxygenation: Pre-oxygenation with 100% oxygen Intubation Type: IV induction Ventilation: Mask ventilation without difficulty Laryngoscope Size: Mac and 4 Grade View: Grade II Tube type: Oral Tube size: 7.5 mm Number of attempts: 1 Airway Equipment and Method: Stylet Placement Confirmation: ETT inserted through vocal cords under direct vision,  positive ETCO2 and breath sounds checked- equal and bilateral Secured at: 21 cm Tube secured with: Tape Dental Injury: Teeth and Oropharynx as per pre-operative assessment

## 2016-01-16 NOTE — Progress Notes (Signed)
[facility] Progress Note  Patient: Kathy Bellowatricia M Montavon ZOX:[id number]   PCP: Oletha BlendWITTEN, BOBBY D, MD DOB: [birth year]   DOA: 01/15/2016   DOS: 01/16/2016   Date of Service: the patient was seen and examined on 01/16/2016  Subjective: Patient mentions the pain has been under control with medication. No nausea. No vomiting. No diarrhea no constipation. Nutrition: Remains nothing by mouth  Brief hospital course: Pt. with PMH of essential hypertension and type 2 diabetes mellitus, dyslipidemia; admitted on 01/15/2016, with complaint of right upper quadrant pain, was found to have cholecystitis. Currently further plan is plan for cholecystectomy today continue IV antibiotics.  Assessment and Plan: 1. Cholecystitis, acute LFTs are normal, no CBD obstructions identified on ultrasound or CT scan. Lipase normal. Continue IV ceftriaxone. Continue IV fluids, when necessary Zofran, IV Pepcid, IV morphine when necessary Plan for cholecystectomy per surgery. We'll monitor postoperative course.  2. Right nephrolithiasis. Primary admitted by urology. Currently patient will have outpatient follow-up with urology. procedure plan since no hydronephrosis.  3.  Type 2 diabetes mellitus. Continue sliding scale insulin. Hemoglobin A1c pending.  4. Essential hypertension. currently holding blood pressure medications.  5. Dyslipidemia. Holding Zetia.  Pain management: When necessary morphine, oxycodone, Robaxin Activity: Independent Bowel regimen: last BM 01/14/2016 Diet: Nothing by mouth DVT Prophylaxis: subcutaneous Heparin  Advance goals of care discussion: full code  Family Communication: no family was present at bedside, at the time of interview.   Disposition:  Discharge to likely home. Expected discharge date: 06/14-15/2017, pending postoperative course.  Consultants: Primary admission from urology, general surgery Procedures: none  Antibiotics: Anti-infectives    Start     Dose/Rate  Route Frequency Ordered Stop   01/15/16 1800  cefTRIAXone (ROCEPHIN) 2 g in dextrose 5 % 50 mL IVPB     2 g 100 mL/hr over 30 Minutes Intravenous Every 24 hours 01/15/16 1713          Intake/Output Summary (Last 24 hours) at 01/16/16 1415 Last data filed at 01/16/16 0600  Gross per 24 hour  Intake 1797.5 ml  Output      0 ml  Net 1797.5 ml   Filed Weights   01/15/16 1332  Weight: 99.655 kg (219 lb 11.2 oz)    Objective: Physical Exam: Filed Vitals:   01/15/16 2115 01/16/16 0529 01/16/16 1017 01/16/16 1353  BP: 131/59 124/59 118/63 135/73  Pulse: 93 96 97 104  Temp: 100.2 F (37.9 C) 100.2 F (37.9 C) 99.8 F (37.7 C) 99.1 F (37.3 C)  TempSrc: Oral Oral Oral Oral  Resp: 20 20 20 20   Height:      Weight:      SpO2: 90% 92% 93% 93%    General: Alert, Awake and Oriented to Time, Place and Person. Appear in mild distress Eyes: PERRL, Conjunctiva normal ENT: Oral Mucosa clear moist. Neck: no JVD, no Abnormal Mass Or lumps Cardiovascular: S1 and S2 Present, no Murmur, Respiratory: Bilateral Air entry equal and Decreased, Clear to Auscultation, no Crackles, no wheezes Abdomen: Bowel Sound present, Soft and RUQ tenderness Skin: no redness, no Rash  Extremities: no Pedal edema, no calf tenderness Neurologic: Grossly no focal neuro deficit. Bilaterally Equal motor strength  Data Reviewed: CBC:  Recent Labs Lab 01/14/16 1752 01/15/16 1352 01/16/16 0517  WBC 11.8* 18.7* 19.9*  NEUTROABS 10.1* 15.7* 15.9*  HGB 13.6 13.7 12.1  HCT 42.1 39.6 36.1  MCV 90.1 88.0 89.6  PLT 306 344 277   Basic Metabolic Panel:  Recent Labs Lab 01/14/16  1752 01/15/16 1658 01/16/16 0517  NA 136 136 134*  K 4.0 4.6 4.0  CL 101 99* 99*  CO2 25 29 28   GLUCOSE 166* 149* 162*  BUN 16 15 12   CREATININE 0.93 0.85 0.97  CALCIUM 9.9 9.4 8.9  MG  --   --  1.5*    Liver Function Tests:  Recent Labs Lab 01/14/16 1752 01/15/16 1658 01/16/16 0517  AST 21 45* 26  ALT 17 26 34    ALKPHOS 58 58 58  BILITOT 0.4 1.4* 0.9  PROT 6.9 7.6 6.8  ALBUMIN 4.1 4.4 3.9    Recent Labs Lab 01/14/16 1752 01/15/16 1658  LIPASE 33 25  AMYLASE  --  42   No results for input(s): AMMONIA in the last 168 hours. Coagulation Profile:  Recent Labs Lab 01/16/16 0517  INR 1.26   Cardiac Enzymes:  Recent Labs Lab 01/15/16 1658 01/15/16 2247 01/16/16 0517  TROPONINI <0.03 <0.03 <0.03   BNP (last 3 results) No results for input(s): PROBNP in the last 8760 hours.  CBG:  Recent Labs Lab 01/15/16 2113 01/16/16 0007 01/16/16 0525 01/16/16 0745 01/16/16 1154  GLUCAP 155* 152* 166* 146* 146*    Studies: Dg Abd 1 View  01/15/2016  CLINICAL DATA:  Upper abdominal pain EXAM: ABDOMEN - 1 VIEW COMPARISON:  CT from 01/14/2016 FINDINGS: Scattered large and small bowel gas is noted. No free air is seen. No abnormal mass or abnormal calcifications are seen. No bony abnormality is noted. IMPRESSION: No acute abnormality seen. Electronically Signed   By: Alcide Clever M.D.   On: 01/15/2016 16:41   US Abdomen Complete  01/15/2016  CLINICAL DATA:  60 year old female with epigastric abdominal pain. Diagnosed with right renal UPJ stone by CT yesterday. Subsequent encounter. EXAM: ABDOMEN ULTRASOUND COMPLETE COMPARISON:  CT Abdomen and Pelvis 01/14/2016, and earlier. FINDINGS: Gallbladder: Multiple shadowing gallstones, individually up to 2.9 cm diameter. Borderline to mild gallbladder wall thickening measuring 4 mm. Superimposed dependent sludge. No pericholecystic fluid. No sonographic Murphy sign elicited. Common bile duct: Diameter: 5 mm, normal Liver: No focal lesion identified. Liver echogenicity at the upper limits of normal to mildly increased (image 63). IVC: No abnormality visualized. Pancreas: Visualized portion unremarkable. Spleen: Size and appearance within normal limits. Right Kidney: Length: 10.0 cm. Echogenicity within normal limits. No mass or hydronephrosis visualized.  However, the right UPJ region calculus is detected on image 82 and appears stable in position since yesterday. Left Kidney: Length: 11.4 cm. Stable simple 3.5 cm cyst. Echogenicity within normal limits. No mass or hydronephrosis visualized. Abdominal aorta: No aneurysm visualized. Other findings: None. IMPRESSION: 1. Positive for Cholelithiasis. Mild gallbladder wall thickening such that early Acute Cholecystitis should be considered. No evidence of biliary obstruction. 2. Right renal UPJ calculus appears stable since yesterday. No hydronephrosis. 3. Borderline to mild hepatic steatosis. Electronically Signed   By: Odessa Fleming M.D.   On: 01/15/2016 16:10   Dg Chest Port 1 View  01/15/2016  CLINICAL DATA:  Upper abdominal pain and shortness of Breath EXAM: PORTABLE CHEST 1 VIEW COMPARISON:  None. FINDINGS: The heart size and mediastinal contours are within normal limits. Both lungs are clear. The visualized skeletal structures are unremarkable. IMPRESSION: No active disease. Electronically Signed   By: Alcide Clever M.D.   On: 01/15/2016 16:42     Scheduled Meds: . cefTRIAXone (ROCEPHIN)  IV  2 g Intravenous Q24H  . famotidine (PEPCID) IV  20 mg Intravenous Q12H  . insulin aspart  0-9  Units Subcutaneous Q4H   Continuous Infusions: . dextrose 5 % and 0.45 % NaCl with KCl 20 mEq/L 125 mL/hr at 01/16/16 0400   PRN Meds: acetaminophen, bisacodyl, diphenhydrAMINE **OR** diphenhydrAMINE, magnesium hydroxide, methocarbamol, morphine injection, ondansetron, oxyCODONE, zolpidem  Time spent: 30 minutes  Author: Lynden Oxford, MD [facility] Pager: [phone] 01/16/2016 2:15 PM  If 7PM-7AM, please contact night-coverage at [email or web address], password [email or web address]

## 2016-01-16 NOTE — Transfer of Care (Signed)
Immediate Anesthesia Transfer of Care Note  Patient: Kathy [name]  Procedure(s) Performed: Procedure(s): LAPAROSCOPIC CHOLECYSTECTOMY WITH INTRAOPERATIVE CHOLANGIOGRAM (N/A)  Patient Location: PACU  Anesthesia Type:General  Level of Consciousness: awake, alert  and oriented  Airway & Oxygen Therapy: Patient Spontanous Breathing and Patient connected to face mask oxygen  Post-op Assessment: Report given to RN and Post -op Vital signs reviewed and stable  Post vital signs: Reviewed and stable  Last Vitals:  Filed Vitals:   01/16/16 1017 01/16/16 1353  BP: 118/63 135/73  Pulse: 97 104  Temp: 37.7 C 37.3 C  Resp: 20 20    Last Pain:  Filed Vitals:   01/16/16 1542  PainSc: Asleep      Patients Stated Pain Goal: 3 (01/16/16 1511)  Complications: No apparent anesthesia complications

## 2016-01-16 NOTE — Care Management Note (Signed)
Case Management Note  Patient Details  Name: Robert Bellowatricia M Clonch MRN: [id number] Date of Birth: Mar 06, 1956  Subjective/Objective:59 y/o f admitted w/cholecystitis. From home.                    Action/Plan:d/c plan home.   Expected Discharge Date:   (unknown)               Expected Discharge Plan:  Home/Self Care  In-House Referral:     Discharge planning Services  CM Consult  Post Acute Care Choice:    Choice offered to:     DME Arranged:    DME Agency:     HH Arranged:    HH Agency:     Status of Service:  In process, will continue to follow  Medicare Important Message Given:    Date Medicare IM Given:    Medicare IM give by:    Date Additional Medicare IM Given:    Additional Medicare Important Message give by:     If discussed at Long Length of Stay Meetings, dates discussed:    Additional Comments:  Lanier ClamMahabir, Zonie Crutcher, RN 01/16/2016, 12:51 PM

## 2016-01-17 ENCOUNTER — Encounter (HOSPITAL_COMMUNITY): Payer: Self-pay | Admitting: Surgery

## 2016-01-17 DIAGNOSIS — Z6841 Body Mass Index (BMI) 40.0 and over, adult: Secondary | ICD-10-CM | POA: Diagnosis not present

## 2016-01-17 DIAGNOSIS — Z79899 Other long term (current) drug therapy: Secondary | ICD-10-CM | POA: Diagnosis not present

## 2016-01-17 DIAGNOSIS — E785 Hyperlipidemia, unspecified: Secondary | ICD-10-CM | POA: Diagnosis not present

## 2016-01-17 DIAGNOSIS — I10 Essential (primary) hypertension: Secondary | ICD-10-CM | POA: Diagnosis not present

## 2016-01-17 DIAGNOSIS — K8012 Calculus of gallbladder with acute and chronic cholecystitis without obstruction: Secondary | ICD-10-CM | POA: Diagnosis not present

## 2016-01-17 DIAGNOSIS — K810 Acute cholecystitis: Secondary | ICD-10-CM | POA: Diagnosis not present

## 2016-01-17 DIAGNOSIS — Z7984 Long term (current) use of oral hypoglycemic drugs: Secondary | ICD-10-CM | POA: Diagnosis not present

## 2016-01-17 DIAGNOSIS — E119 Type 2 diabetes mellitus without complications: Secondary | ICD-10-CM | POA: Diagnosis not present

## 2016-01-17 DIAGNOSIS — R1013 Epigastric pain: Secondary | ICD-10-CM | POA: Diagnosis not present

## 2016-01-17 DIAGNOSIS — N200 Calculus of kidney: Secondary | ICD-10-CM | POA: Diagnosis not present

## 2016-01-17 DIAGNOSIS — Z841 Family history of disorders of kidney and ureter: Secondary | ICD-10-CM | POA: Diagnosis not present

## 2016-01-17 LAB — GLUCOSE, CAPILLARY
Glucose-Capillary: 102 mg/dL — ABNORMAL HIGH (ref 65–99)
Glucose-Capillary: 113 mg/dL — ABNORMAL HIGH (ref 65–99)
Glucose-Capillary: 115 mg/dL — ABNORMAL HIGH (ref 65–99)
Glucose-Capillary: 163 mg/dL — ABNORMAL HIGH (ref 65–99)
Glucose-Capillary: 170 mg/dL — ABNORMAL HIGH (ref 65–99)
Glucose-Capillary: 178 mg/dL — ABNORMAL HIGH (ref 65–99)

## 2016-01-17 LAB — CBC WITH DIFFERENTIAL/PLATELET
Basophils Absolute: 0 10*3/uL (ref 0.0–0.1)
Basophils Relative: 0 %
Eosinophils Absolute: 0 10*3/uL (ref 0.0–0.7)
Eosinophils Relative: 0 %
HCT: 34.4 % — ABNORMAL LOW (ref 36.0–46.0)
Hemoglobin: 11.4 g/dL — ABNORMAL LOW (ref 12.0–15.0)
Lymphocytes Relative: 10 %
Lymphs Abs: 1.5 10*3/uL (ref 0.7–4.0)
MCH: 30 pg (ref 26.0–34.0)
MCHC: 33.1 g/dL (ref 30.0–36.0)
MCV: 90.5 fL (ref 78.0–100.0)
Monocytes Absolute: 1.1 10*3/uL — ABNORMAL HIGH (ref 0.1–1.0)
Monocytes Relative: 7 %
Neutro Abs: 13.3 10*3/uL — ABNORMAL HIGH (ref 1.7–7.7)
Neutrophils Relative %: 83 %
Platelets: 243 10*3/uL (ref 150–400)
RBC: 3.8 MIL/uL — ABNORMAL LOW (ref 3.87–5.11)
RDW: 13.9 % (ref 11.5–15.5)
WBC: 16 10*3/uL — ABNORMAL HIGH (ref 4.0–10.5)

## 2016-01-17 LAB — HEMOGLOBIN A1C
Hgb A1c MFr Bld: 7 % — ABNORMAL HIGH (ref 4.8–5.6)
Mean Plasma Glucose: 154 mg/dL

## 2016-01-17 LAB — COMPREHENSIVE METABOLIC PANEL
ALT: 43 U/L (ref 14–54)
AST: 31 U/L (ref 15–41)
Albumin: 3.2 g/dL — ABNORMAL LOW (ref 3.5–5.0)
Alkaline Phosphatase: 70 U/L (ref 38–126)
Anion gap: 5 (ref 5–15)
BUN: 8 mg/dL (ref 6–20)
CO2: 28 mmol/L (ref 22–32)
Calcium: 8.2 mg/dL — ABNORMAL LOW (ref 8.9–10.3)
Chloride: 102 mmol/L (ref 101–111)
Creatinine, Ser: 0.78 mg/dL (ref 0.44–1.00)
GFR calc Af Amer: >60 mL/min (ref >60)
GFR calc non Af Amer: >60 mL/min (ref >60)
Glucose, Bld: 157 mg/dL — ABNORMAL HIGH (ref 65–99)
Potassium: 4.3 mmol/L (ref 3.5–5.1)
Sodium: 135 mmol/L (ref 135–145)
Total Bilirubin: 0.8 mg/dL (ref 0.3–1.2)
Total Protein: 6.1 g/dL — ABNORMAL LOW (ref 6.5–8.1)

## 2016-01-17 LAB — MAGNESIUM: Magnesium: 1.8 mg/dL (ref 1.7–2.4)

## 2016-01-17 MED ORDER — HEPARIN SODIUM (PORCINE) 5000 UNIT/ML IJ SOLN
5000.0000 [IU] | Freq: Three times a day (TID) | INTRAMUSCULAR | Status: DC
  Administered 2016-01-17 – 2016-01-18 (×3): 5000 [IU] via SUBCUTANEOUS
  Filled 2016-01-17 (×3): qty 1

## 2016-01-17 NOTE — Progress Notes (Signed)
[facility] Progress Note  Patient: Kathy Bellowatricia M Kissoon ZOX:[id number]   PCP: Oletha BlendWITTEN, BOBBY D, MD DOB: Mar 01, 1956   DOA: 01/15/2016   DOS: 01/17/2016   Date of Service: the patient was seen and examined on 01/17/2016  Subjective:  She reports not having appetite, did not have breakfast. Denies having bowel movement. She states and bleeding around her room  Brief hospital course: Pt. with PMH of essential hypertension and type 2 diabetes mellitus, dyslipidemia; admitted on 01/15/2016, with complaint of right upper quadrant pain, was found to have cholecystitis. Currently further plan is plan for cholecystectomy today continue IV antibiotics.  Assessment and Plan: 1. Cholecystitis, acute LFTs are normal, no CBD obstructions identified on ultrasound or CT scan. Lipase normal. -On 01/16/2016 she underwent laparoscopic cholecystectomy, procedure performed by Dr. Ezzard StandingNewman. He tolerated procedure well there no immediate applications. -She remains on ceftriaxone -Continue supportive care, await surgery's recommendations for discharge.  2. Right nephrolithiasis. Primary admitted by urology. Currently patient will have outpatient follow-up with urology. procedure plan since no hydronephrosis.  3.  Type 2 diabetes mellitus. Continue sliding scale insulin. Hemoglobin A1c pending.  4. Essential hypertension. currently holding blood pressure medications.  5. Dyslipidemia. Holding Zetia.  Pain management: When necessary morphine, oxycodone, Robaxin Activity: Independent Bowel regimen: last BM 01/14/2016 Diet: Soft diet DVT Prophylaxis: subcutaneous Heparin  Advance goals of care discussion: full code  Family Communication: no family was present at bedside, at the time of interview.   Disposition:  Discharge to likely home in the next 24 hours Expected discharge date: 06/14-15/2017, pending postoperative course.  Consultants: Primary admission from urology, general  surgery Procedures: none  Antibiotics: Anti-infectives    Start     Dose/Rate Route Frequency Ordered Stop   01/15/16 1800  cefTRIAXone (ROCEPHIN) 2 g in dextrose 5 % 50 mL IVPB     2 g 100 mL/hr over 30 Minutes Intravenous Every 24 hours 01/15/16 1713          Intake/Output Summary (Last 24 hours) at 01/17/16 1201 Last data filed at 01/17/16 1043  Gross per 24 hour  Intake 4654.58 ml  Output     50 ml  Net 4604.58 ml   Filed Weights   01/15/16 1332  Weight: 99.655 kg (219 lb 11.2 oz)    Objective: Physical Exam: Filed Vitals:   01/16/16 2223 01/16/16 2327 01/17/16 0401 01/17/16 1046  BP: 141/89 147/70 124/56 149/75  Pulse: 97 98 103 107  Temp: 98.7 F (37.1 C) 99.2 F (37.3 C) 99.7 F (37.6 C) 99.6 F (37.6 C)  TempSrc: Oral Oral Oral Oral  Resp: 16 16 16 17   Height:      Weight:      SpO2: 93% 100% 96% 95%    General: Alert, Awake and Oriented to Time, Place and Person. Appear in mild distress Eyes: PERRL, Conjunctiva normal ENT: Oral Mucosa clear moist. Neck: no JVD, no Abnormal Mass Or lumps Cardiovascular: S1 and S2 Present, no Murmur, Respiratory: Bilateral Air entry equal and Decreased, Clear to Auscultation, no Crackles, no wheezes Abdomen: Bowel Sound present, Soft and RUQ tenderness Skin: no redness, no Rash  Extremities: no Pedal edema, no calf tenderness Neurologic: Grossly no focal neuro deficit. Bilaterally Equal motor strength  Data Reviewed: CBC:  Recent Labs Lab 01/14/16 1752 01/15/16 1352 01/16/16 0517 01/17/16 0521  WBC 11.8* 18.7* 19.9* 16.0*  NEUTROABS 10.1* 15.7* 15.9* 13.3*  HGB 13.6 13.7 12.1 11.4*  HCT 42.1 39.6 36.1 34.4*  MCV 90.1 88.0 89.6 90.5  PLT 306 344 277 243   Basic Metabolic Panel:  Recent Labs Lab 01/14/16 1752 01/15/16 1658 01/16/16 0517 01/17/16 0521  NA 136 136 134* 135  K 4.0 4.6 4.0 4.3  CL 101 99* 99* 102  CO2 GLUCOSE 166* 149* 162* 157*  BUN CREATININE 0.93 0.85  0.97 0.78  CALCIUM 9.9 9.4 8.9 8.2*  MG  --   --  1.5* 1.8    Liver Function Tests:  Recent Labs Lab 01/14/16 1752 01/15/16 1658 01/16/16 0517 01/17/16 0521  AST 21 45* 26 31  ALT 17 26 34 43  ALKPHOS 58 58 58 70  BILITOT 0.4 1.4* 0.9 0.8  PROT 6.9 7.6 6.8 6.1*  ALBUMIN 4.1 4.4 3.9 3.2*    Recent Labs Lab 01/14/16 1752 01/15/16 1658  LIPASE 33 25  AMYLASE  --  42   No results for input(s): AMMONIA in the last 168 hours. Coagulation Profile:  Recent Labs Lab 01/16/16 0517  INR 1.26   Cardiac Enzymes:  Recent Labs Lab 01/15/16 1658 01/15/16 2247 01/16/16 0517  TROPONINI <0.03 <0.03 <0.03   BNP (last 3 results) No results for input(s): PROBNP in the last 8760 hours.  CBG:  Recent Labs Lab 01/16/16 1901 01/16/16 2325 01/17/16 0358 01/17/16 0727 01/17/16 1141  GLUCAP 147* 150* 178* 170* 163*    Studies: Dg Cholangiogram Operative  01/16/2016  CLINICAL DATA:  Abdominal pain, acute cholecystitis EXAM: INTRAOPERATIVE CHOLANGIOGRAM TECHNIQUE: Cholangiographic images from the C-arm fluoroscopic device were submitted for interpretation post-operatively. Please see the procedural report for the amount of contrast and the fluoroscopy time utilized. COMPARISON:  Ultrasound 01/15/2016 FINDINGS: No persistent filling defects in the common duct. Intrahepatic ducts are incompletely visualized, appearing decompressed centrally. Contrast passes into the duodenum. : Negative for retained common duct stone. Electronically Signed   By: Corlis Leak M.D.   On: 01/16/2016 18:04     Scheduled Meds: . cefTRIAXone (ROCEPHIN)  IV  2 g Intravenous Q24H  . famotidine (PEPCID) IV  20 mg Intravenous Q12H  . heparin subcutaneous  5,000 Units Subcutaneous Q8H  . insulin aspart  0-9 Units Subcutaneous Q4H   Continuous Infusions: . dextrose 5 % and 0.45 % NaCl with KCl 20 mEq/L 75 mL/hr at 01/17/16 1118   PRN Meds: acetaminophen, ALPRAZolam, bisacodyl, diphenhydrAMINE **OR**  diphenhydrAMINE, magnesium hydroxide, methocarbamol, morphine injection, ondansetron, oxyCODONE, oxyCODONE-acetaminophen, zolpidem  Time spent: 30 minutes  Author: Mahala Menghini, MD [facility] Pager: [phone] 01/17/2016 12:01 PM  If 7PM-7AM, please contact night-coverage at [email or web address], password [email or web address]

## 2016-01-17 NOTE — Progress Notes (Addendum)
1 Day Post-Op  Subjective: She has not eaten, does not have any appetite, very sore, has only been up to BR.   Objective: Vital signs in last 24 hours: Temp:  [98 F (36.7 C)-99.7 F (37.6 C)] 99.6 F (37.6 C) (06/14 1046) Pulse Rate:  [84-107] 107 (06/14 1046) Resp:  [14-20] 17 (06/14 1046) BP: (118-149)/(56-89) 149/75 mmHg (06/14 1046) SpO2:  [93 %-100 %] 95 % (06/14 1046) Last BM Date: 01/14/16 200 Po yesterday recorded Afebrile, VSS Glucose 157 WBC 16K IOC:  No persistent filling defects in the common duct. Intrahepatic ducts are incompletely visualized, appearing decompressed centrally. Contrast passes into the duodenum  Intake/Output from previous day: 06/13 0701 - 06/14 0700 In: 4364.6 [P.O.:200; I.V.:4164.6] Out: 50 [Blood:50] Intake/Output this shift: Total I/O In: 240 [P.O.:240] Out: -   General appearance: alert, cooperative and no distress GI: soft, sore, very few BS, sites OK  Lab Results:   Recent Labs  01/16/16 0517 01/17/16 0521  WBC 19.9* 16.0*  HGB 12.1 11.4*  HCT 36.1 34.4*  PLT 277 243    BMET  Recent Labs  01/16/16 0517 01/17/16 0521  NA 134* 135  K 4.0 4.3  CL 99* 102  CO2 28 28  GLUCOSE 162* 157*  BUN 12 8  CREATININE 0.97 0.78  CALCIUM 8.9 8.2*   PT/INR  Recent Labs  01/16/16 0517  LABPROT 15.9*  INR 1.26     Recent Labs Lab 01/14/16 1752 01/15/16 1658 01/16/16 0517 01/17/16 0521  AST 21 45* 26 31  ALT 17 26 34 43  ALKPHOS 58 58 58 70  BILITOT 0.4 1.4* 0.9 0.8  PROT 6.9 7.6 6.8 6.1*  ALBUMIN 4.1 4.4 3.9 3.2*     Lipase     Component Value Date/Time   LIPASE 25 01/15/2016 1658     Studies/Results: Dg Abd 1 View  01/15/2016  CLINICAL DATA:  Upper abdominal pain EXAM: ABDOMEN - 1 VIEW COMPARISON:  CT from 01/14/2016 FINDINGS: Scattered large and small bowel gas is noted. No free air is seen. No abnormal mass or abnormal calcifications are seen. No bony abnormality is noted. IMPRESSION: No acute  abnormality seen. Electronically Signed   By: Alcide CleverMark  Lukens M.D.   On: 01/15/2016 16:41   Dg Cholangiogram Operative  01/16/2016  CLINICAL DATA:  Abdominal pain, acute cholecystitis EXAM: INTRAOPERATIVE CHOLANGIOGRAM TECHNIQUE: Cholangiographic images from the C-arm fluoroscopic device were submitted for interpretation post-operatively. Please see the procedural report for the amount of contrast and the fluoroscopy time utilized. COMPARISON:  Ultrasound 01/15/2016 FINDINGS: No persistent filling defects in the common duct. Intrahepatic ducts are incompletely visualized, appearing decompressed centrally. Contrast passes into the duodenum. : Negative for retained common duct stone. Electronically Signed   By: Corlis Leak  Hassell M.D.   On: 01/16/2016 18:04   [place] Abdomen Complete  01/15/2016  CLINICAL DATA:  60 year old female with epigastric abdominal pain. Diagnosed with right renal UPJ stone by CT yesterday. Subsequent encounter. EXAM: ABDOMEN ULTRASOUND COMPLETE COMPARISON:  CT Abdomen and Pelvis 01/14/2016, and earlier. FINDINGS: Gallbladder: Multiple shadowing gallstones, individually up to 2.9 cm diameter. Borderline to mild gallbladder wall thickening measuring 4 mm. Superimposed dependent sludge. No pericholecystic fluid. No sonographic Murphy sign elicited. Common bile duct: Diameter: 5 mm, normal Liver: No focal lesion identified. Liver echogenicity at the upper limits of normal to mildly increased (image 63). IVC: No abnormality visualized. Pancreas: Visualized portion unremarkable. Spleen: Size and appearance within normal limits. Right Kidney: Length: 10.0 cm. Echogenicity within normal limits.  No mass or hydronephrosis visualized. However, the right UPJ region calculus is detected on image 82 and appears stable in position since yesterday. Left Kidney: Length: 11.4 cm. Stable simple 3.5 cm cyst. Echogenicity within normal limits. No mass or hydronephrosis visualized. Abdominal aorta: No aneurysm visualized.  Other findings: None. IMPRESSION: 1. Positive for Cholelithiasis. Mild gallbladder wall thickening such that early Acute Cholecystitis should be considered. No evidence of biliary obstruction. 2. Right renal UPJ calculus appears stable since yesterday. No hydronephrosis. 3. Borderline to mild hepatic steatosis. Electronically Signed   By: Odessa Fleming M.D.   On: 01/15/2016 16:10   Dg Chest Port 1 View  01/15/2016  CLINICAL DATA:  Upper abdominal pain and shortness of Breath EXAM: PORTABLE CHEST 1 VIEW COMPARISON:  None. FINDINGS: The heart size and mediastinal contours are within normal limits. Both lungs are clear. The visualized skeletal structures are unremarkable. IMPRESSION: No active disease. Electronically Signed   By: Alcide Clever M.D.   On: 01/15/2016 16:42    Medications: . cefTRIAXone (ROCEPHIN)  IV  2 g Intravenous Q24H  . famotidine (PEPCID) IV  20 mg Intravenous Q12H  . insulin aspart  0-9 Units Subcutaneous Q4H   . dextrose 5 % and 0.45 % NaCl with KCl 20 mEq/L 125 mL/hr at 01/17/16 0631    Hypertension AODM Anxiety Right nephrolithiasis Hyperlipidemia   Assessment/Plan  Acute cholecystitis, cholelithiasis (large stone impacted in the neck of the gall bladder) S/p laparoscopic cholecystectomy 01/16/16, Dr. Ezzard Standing FEN:  Soft diet and IV fluids ID:  Rocephin 1 dose  1853 01/15/16  -  I have ask pharmacy to look into this (Home on Augmentin x 5 day) DVT:  SCD only/add heparin  Plan:  Mobilize, let her advance diet, decrease fluids, continue antibiotics, I have ask pharmacy to fix issue with Rocephin.  I will recheck labs in AM.       JENNINGS,WILLARD 01/17/2016 [phone]  Agree with above.  She does not have much of an appetite.   Her husband is in the room.  They asked about the kidney stone.  She can make a follow up with Dr. Annabell Howells in 2 to 4 weeks.  There does not appear to be anything urgent about it, but it may need intervention.  Ovidio Kin, MD, [facility] Pager: [phone] Office phone:  [phone]

## 2016-01-18 DIAGNOSIS — E119 Type 2 diabetes mellitus without complications: Secondary | ICD-10-CM | POA: Diagnosis not present

## 2016-01-18 DIAGNOSIS — I10 Essential (primary) hypertension: Secondary | ICD-10-CM | POA: Diagnosis not present

## 2016-01-18 DIAGNOSIS — Z6841 Body Mass Index (BMI) 40.0 and over, adult: Secondary | ICD-10-CM | POA: Diagnosis not present

## 2016-01-18 DIAGNOSIS — K810 Acute cholecystitis: Secondary | ICD-10-CM | POA: Diagnosis not present

## 2016-01-18 DIAGNOSIS — R1013 Epigastric pain: Secondary | ICD-10-CM | POA: Diagnosis not present

## 2016-01-18 DIAGNOSIS — K8012 Calculus of gallbladder with acute and chronic cholecystitis without obstruction: Secondary | ICD-10-CM | POA: Diagnosis not present

## 2016-01-18 DIAGNOSIS — Z7984 Long term (current) use of oral hypoglycemic drugs: Secondary | ICD-10-CM | POA: Diagnosis not present

## 2016-01-18 DIAGNOSIS — E785 Hyperlipidemia, unspecified: Secondary | ICD-10-CM | POA: Diagnosis not present

## 2016-01-18 DIAGNOSIS — Z841 Family history of disorders of kidney and ureter: Secondary | ICD-10-CM | POA: Diagnosis not present

## 2016-01-18 DIAGNOSIS — Z79899 Other long term (current) drug therapy: Secondary | ICD-10-CM | POA: Diagnosis not present

## 2016-01-18 DIAGNOSIS — N200 Calculus of kidney: Secondary | ICD-10-CM | POA: Diagnosis not present

## 2016-01-18 LAB — BASIC METABOLIC PANEL
Anion gap: 7 (ref 5–15)
BUN: 8 mg/dL (ref 6–20)
CO2: 26 mmol/L (ref 22–32)
Calcium: 8.4 mg/dL — ABNORMAL LOW (ref 8.9–10.3)
Chloride: 102 mmol/L (ref 101–111)
Creatinine, Ser: 0.71 mg/dL (ref 0.44–1.00)
GFR calc Af Amer: >60 mL/min (ref >60)
GFR calc non Af Amer: >60 mL/min (ref >60)
Glucose, Bld: 133 mg/dL — ABNORMAL HIGH (ref 65–99)
Potassium: 4 mmol/L (ref 3.5–5.1)
Sodium: 135 mmol/L (ref 135–145)

## 2016-01-18 LAB — CBC
HCT: 34.4 % — ABNORMAL LOW (ref 36.0–46.0)
Hemoglobin: 11.3 g/dL — ABNORMAL LOW (ref 12.0–15.0)
MCH: 29.7 pg (ref 26.0–34.0)
MCHC: 32.8 g/dL (ref 30.0–36.0)
MCV: 90.3 fL (ref 78.0–100.0)
Platelets: 299 10*3/uL (ref 150–400)
RBC: 3.81 MIL/uL — ABNORMAL LOW (ref 3.87–5.11)
RDW: 13.6 % (ref 11.5–15.5)
WBC: 13.4 10*3/uL — ABNORMAL HIGH (ref 4.0–10.5)

## 2016-01-18 LAB — GLUCOSE, CAPILLARY
Glucose-Capillary: 120 mg/dL — ABNORMAL HIGH (ref 65–99)
Glucose-Capillary: 123 mg/dL — ABNORMAL HIGH (ref 65–99)
Glucose-Capillary: 152 mg/dL — ABNORMAL HIGH (ref 65–99)

## 2016-01-18 MED ORDER — IBUPROFEN 200 MG PO TABS
600.0000 mg | ORAL_TABLET | Freq: Once | ORAL | Status: AC
  Administered 2016-01-18: 600 mg via ORAL
  Filled 2016-01-18: qty 3

## 2016-01-18 MED ORDER — CEFUROXIME AXETIL 500 MG PO TABS: 500.0000 mg | ORAL_TABLET | Freq: Two times a day (BID) | ORAL | Status: DC

## 2016-01-18 NOTE — Discharge Summary (Signed)
Physician Discharge Summary  Kathy [name] XBM:[id number] DOB: [birth year] DOA: 01/15/2016  PCP: Oletha Blend, MD  Admit date: 01/15/2016 Discharge date: 01/18/2016  Time spent: 35 minutes  Recommendations for Outpatient Follow-up:   1. Please follow-up on blood pressures, blood pressures on the low-normal side for which lisinopril and maxzide were held on discharge. 2. She underwent cholecystectomy during this hospitalization and will follow-up with [facility] surgery   Discharge Diagnoses:  Principal Problem:   Cholecystitis, acute Active Problems:   Abdominal pain, epigastric   Right nephrolithiasis   Diabetes mellitus without complication (HCC)   Hypertension   Hyperlipidemia   Discharge Condition: Stable  Diet recommendation:  Heart healthy  Filed Weights   01/15/16 1332  Weight: 99.655 kg (219 lb 11.2 oz)    History of present illness:  Kathy [name] is a 60 year-old female patient who is here for renal calculi.  The problem is on the right side. She first stated noticing pain on approximately 01/14/2016. This is not her first kidney stone. Her first stone was approximately 01/04/1976. She has had 2 stones prior to getting this one. She is currently having flank pain, fever, and chills. She denies having back pain, groin pain, nausea, and vomiting.   She has had ureteroscopy for treatment of her stones in the past.   She had the onset yesterday morning of epigastric area burning. She then had left upper quadrant pain. The pain is severe. She has had no hematuria. She has no nausea or vomiting.   Hospital Course:  Kathy [name] is a pleasant 60 year old female with a past medical history of diabetes mellitus, hypertension, dyslipidemia, admitted originally to the urology service on 01/15/2016 when she presented with complaints of right upper quadrant pain. Initially it was felt that kidney stones could be the cause of her pain symptoms however right of quadrant  ultrasound showed presence of cholelithiasis with mild gallbladder wall thickening. There was concern that this could reflect early acute cholecystitis. General surgery was consulted as acute cholecystitis was a likely cause of her right upper quadrant pain rather than renal calculi. On 01/16/2016 she underwent laparoscopic cholecystectomy, procedure performed by Dr. Ezzard Standing. During this time she was treated with IV ceftriaxone. She did well postoperatively. Diet was advanced. Given significant improvement she was discharged to home on 01/18/2016. With regard to nephrolithiasis she will follow-up at [facility]. Imaging did not reveal presence of hydronephrosis thus will see him in the office.  Procedures:  Laparoscopic cholecystectomy performed on 01/16/2016 by Dr. Ezzard Standing of general surgery  Consultations:  General surgery  Urology  Discharge Exam: Filed Vitals:   01/17/16 2033 01/18/16 0411  BP: 119/70 134/68  Pulse: 89 99  Temp: 98.6 F (37 C) 100.4 F (38 C)  Resp: 20 20    General: Alert, Awake and Oriented to Time, Place and Person. Appear in mild distress Eyes: PERRL, Conjunctiva normal ENT: Oral Mucosa clear moist. Neck: no JVD, no Abnormal Mass Or lumps Cardiovascular: S1 and S2 Present, no Murmur, Respiratory: Bilateral Air entry equal and Decreased, Clear to Auscultation, no Crackles, no wheezes Abdomen: Bowel Sound present, Soft and RUQ tenderness Skin: no redness, no Rash  Extremities: no Pedal edema, no calf tenderness Neurologic: Grossly no focal neuro deficit. Bilaterally Equal motor strength  Discharge Instructions   Discharge Instructions    Call MD for:  difficulty breathing, headache or visual disturbances    Complete by:  As directed      Call MD for:  extreme fatigue    Complete by:  As directed      Call MD for:  hives    Complete by:  As directed      Call MD for:  persistant dizziness or light-headedness    Complete by:  As directed       Call MD for:  persistant nausea and vomiting    Complete by:  As directed      Call MD for:  redness, tenderness, or signs of infection (pain, swelling, redness, odor or green/yellow discharge around incision site)    Complete by:  As directed      Call MD for:  severe uncontrolled pain    Complete by:  As directed      Call MD for:  temperature >100.4    Complete by:  As directed      Call MD for:    Complete by:  As directed      Diet - low sodium heart healthy    Complete by:  As directed      Increase activity slowly    Complete by:  As directed           Current Discharge Medication List    START taking these medications   Details  cefUROXime (CEFTIN) 500 MG tablet Take 1 tablet (500 mg total) by mouth 2 (two) times daily with a meal. Qty: 10 tablet, Refills: 0      CONTINUE these medications which have NOT CHANGED   Details  ALPRAZolam (XANAX) 0.5 MG tablet Take 0.5 mg by mouth at bedtime as needed for anxiety.     cetirizine (ZYRTEC CHILDRENS ALLERGY) 10 MG chewable tablet Chew 10 mg by mouth daily.    diltiazem (TIAZAC) 360 MG 24 hr capsule Take 360 mg by mouth daily.    ezetimibe (ZETIA) 10 MG tablet Take 10 mg by mouth daily.    ibuprofen (ADVIL,MOTRIN) 200 MG tablet Take 600 mg by mouth every 6 (six) hours as needed for headache or moderate pain.    metFORMIN (GLUCOPHAGE-XR) 500 MG 24 hr tablet Take 500 mg by mouth daily. Refills: 0    ondansetron (ZOFRAN) 4 MG tablet Take 1 tablet (4 mg total) by mouth every 6 (six) hours. Qty: 12 tablet, Refills: 0    oxyCODONE-acetaminophen (PERCOCET/ROXICET) 5-325 MG tablet Take 1 tablet by mouth every 4 (four) hours as needed for severe pain. Qty: 15 tablet, Refills: 0    tamsulosin (FLOMAX) 0.4 MG CAPS capsule Take 1 capsule (0.4 mg total) by mouth daily. Qty: 30 capsule, Refills: 0      STOP taking these medications     lisinopril (PRINIVIL,ZESTRIL) 20 MG tablet      triamterene-hydrochlorothiazide (MAXZIDE-25)  37.5-25 MG per tablet      ketorolac (TORADOL) 10 MG tablet        Allergies  Allergen Reactions  . Atorvastatin Other (See Comments)    intolerance  . Rosuvastatin Other (See Comments)    intolerance  . Simvastatin Other (See Comments)    intolerance  . Dexamethasone Rash   Follow-up Information    Follow up with [facility] On 01/31/2016.   Specialty:  General Surgery   Why:  Your appointment is at 10:15 AM, be at the office 30 minutes early for check in.     Contact information:   [address] [place] [address] [phone]       Follow up with Oletha Blend, MD In 2  weeks.   Specialty:  Family Medicine   Contact information:   [phone] [place] [address] [phone]        The results of significant diagnostics from this hospitalization (including imaging, microbiology, ancillary and laboratory) are listed below for reference.    Significant Diagnostic Studies: Dg Abd 1 View  01/15/2016  CLINICAL DATA:  Upper abdominal pain EXAM: ABDOMEN - 1 VIEW COMPARISON:  CT from 01/14/2016 FINDINGS: Scattered large and small bowel gas is noted. No free air is seen. No abnormal mass or abnormal calcifications are seen. No bony abnormality is noted. IMPRESSION: No acute abnormality seen. Electronically Signed   By: Alcide Clever M.D.   On: 01/15/2016 16:41   Dg Cholangiogram Operative  01/16/2016  CLINICAL DATA:  Abdominal pain, acute cholecystitis EXAM: INTRAOPERATIVE CHOLANGIOGRAM TECHNIQUE: Cholangiographic images from the C-arm fluoroscopic device were submitted for interpretation post-operatively. Please see the procedural report for the amount of contrast and the fluoroscopy time utilized. COMPARISON:  Ultrasound 01/15/2016 FINDINGS: No persistent filling defects in the common duct. Intrahepatic ducts are incompletely visualized, appearing decompressed centrally. Contrast passes into the duodenum. : Negative for retained common duct stone.  Electronically Signed   By: Corlis Leak M.D.   On: 01/16/2016 18:04   US Abdomen Complete  01/15/2016  CLINICAL DATA:  60 year old female with epigastric abdominal pain. Diagnosed with right renal UPJ stone by CT yesterday. Subsequent encounter. EXAM: ABDOMEN ULTRASOUND COMPLETE COMPARISON:  CT Abdomen and Pelvis 01/14/2016, and earlier. FINDINGS: Gallbladder: Multiple shadowing gallstones, individually up to 2.9 cm diameter. Borderline to mild gallbladder wall thickening measuring 4 mm. Superimposed dependent sludge. No pericholecystic fluid. No sonographic Murphy sign elicited. Common bile duct: Diameter: 5 mm, normal Liver: No focal lesion identified. Liver echogenicity at the upper limits of normal to mildly increased (image 63). IVC: No abnormality visualized. Pancreas: Visualized portion unremarkable. Spleen: Size and appearance within normal limits. Right Kidney: Length: 10.0 cm. Echogenicity within normal limits. No mass or hydronephrosis visualized. However, the right UPJ region calculus is detected on image 82 and appears stable in position since yesterday. Left Kidney: Length: 11.4 cm. Stable simple 3.5 cm cyst. Echogenicity within normal limits. No mass or hydronephrosis visualized. Abdominal aorta: No aneurysm visualized. Other findings: None. IMPRESSION: 1. Positive for Cholelithiasis. Mild gallbladder wall thickening such that early Acute Cholecystitis should be considered. No evidence of biliary obstruction. 2. Right renal UPJ calculus appears stable since yesterday. No hydronephrosis. 3. Borderline to mild hepatic steatosis. Electronically Signed   By: Odessa Fleming M.D.   On: 01/15/2016 16:10   Ct Abdomen Pelvis W Contrast  01/14/2016  CLINICAL DATA:  Acute onset of epigastric abdominal pain. Initial encounter. EXAM: CT ABDOMEN AND PELVIS WITH CONTRAST TECHNIQUE: Multidetector CT imaging of the abdomen and pelvis was performed using the standard protocol following bolus administration of intravenous  contrast. CONTRAST:  ISOVUE-300 IOPAMIDOL (ISOVUE-300) INJECTION 61% COMPARISON:  MRI of the lumbar spine performed 01/24/2009, and CT of the abdomen and pelvis from 08/22/2004 FINDINGS: Minimal bibasilar atelectasis is noted. The liver and spleen are unremarkable in appearance. The gallbladder is within normal limits. The pancreas and adrenal glands are unremarkable. A 3.5 cm cyst is noted at the posterior aspect of the left kidney. Minimal nonspecific perinephric stranding is noted bilaterally. A nonobstructing 9 mm stone is noted at the right renal pelvis. No obstructing ureteral stones are identified. There is no evidence of hydronephrosis. No free fluid is identified. The small bowel is unremarkable in appearance.  The stomach is within normal limits. No acute vascular abnormalities are seen. Minimal calcification is seen along the abdominal aorta and its branches. The appendix is normal in caliber, without evidence of appendicitis. Minimal diverticulosis is noted along the sigmoid colon, without evidence of diverticulitis. The bladder is mildly distended and grossly unremarkable. The patient is status post hysterectomy. No suspicious adnexal masses are seen. No inguinal lymphadenopathy is seen. No acute osseous abnormalities are identified. IMPRESSION: 1. No acute abnormality seen to explain the patient's symptoms. 2. Nonobstructing 9 mm stone at the right renal pelvis. No evidence of hydronephrosis. 3. Left renal cyst noted. 4. Minimal diverticulosis along the sigmoid colon, without evidence of diverticulitis. Electronically Signed   By: Roanna RaiderJeffery  Chang M.D.   On: 01/14/2016 19:21   Dg Chest Port 1 View  01/15/2016  CLINICAL DATA:  Upper abdominal pain and shortness of Breath EXAM: PORTABLE CHEST 1 VIEW COMPARISON:  None. FINDINGS: The heart size and mediastinal contours are within normal limits. Both lungs are clear. The visualized skeletal structures are unremarkable. IMPRESSION: No active disease.  Electronically Signed   By: Alcide CleverMark  Lukens M.D.   On: 01/15/2016 16:42    Microbiology: Recent Results (from the past 240 hour(s))  Surgical pcr screen     Status: None   Collection Time: 01/16/16  8:05 AM  Result Value Ref Range Status   MRSA, PCR NEGATIVE NEGATIVE Final   Staphylococcus aureus NEGATIVE NEGATIVE Final    Comment:        The Xpert SA Assay (FDA approved for NASAL specimens in patients over 62 years of age), is one component of a comprehensive surveillance program.  Test performance has been validated by [facility] for patients greater than or equal to 116 year old. It is not intended to diagnose infection nor to guide or monitor treatment.      Labs: Basic Metabolic Panel:  Recent Labs Lab 01/14/16 1752 01/15/16 1658 01/16/16 0517 01/17/16 0521 01/18/16 0501  NA 136 136 134* 135 135  K 4.0 4.6 4.0 4.3 4.0  CL 101 99* 99* 102 102  CO2 25 29 28 28 26   GLUCOSE 166* 149* 162* 157* 133*  BUN 16 15 12 8 8   CREATININE 0.93 0.85 0.97 0.78 0.71  CALCIUM 9.9 9.4 8.9 8.2* 8.4*  MG  --   --  1.5* 1.8  --    Liver Function Tests:  Recent Labs Lab 01/14/16 1752 01/15/16 1658 01/16/16 0517 01/17/16 0521  AST 21 45* 26 31  ALT 17 26 34 43  ALKPHOS 58 58 58 70  BILITOT 0.4 1.4* 0.9 0.8  PROT 6.9 7.6 6.8 6.1*  ALBUMIN 4.1 4.4 3.9 3.2*    Recent Labs Lab 01/14/16 1752 01/15/16 1658  LIPASE 33 25  AMYLASE  --  42   No results for input(s): AMMONIA in the last 168 hours. CBC:  Recent Labs Lab 01/14/16 1752 01/15/16 1352 01/16/16 0517 01/17/16 0521 01/18/16 0501  WBC 11.8* 18.7* 19.9* 16.0* 13.4*  NEUTROABS 10.1* 15.7* 15.9* 13.3*  --   HGB 13.6 13.7 12.1 11.4* 11.3*  HCT 42.1 39.6 36.1 34.4* 34.4*  MCV 90.1 88.0 89.6 90.5 90.3  PLT 306 344 277 243 299   Cardiac Enzymes:  Recent Labs Lab 01/15/16 1658 01/15/16 2247 01/16/16 0517  TROPONINI <0.03 <0.03 <0.03   BNP: BNP (last 3 results) No results for input(s): BNP in the last  8760 hours.  ProBNP (last 3 results) No results for input(s): PROBNP in the  last 8760 hours.  CBG:  Recent Labs Lab 01/17/16 1547 01/17/16 1946 01/17/16 2359 01/18/16 0407 01/18/16 0724  GLUCAP 102* 113* 115* 120* 123*       Signed:  Jeralyn Bennett MD.  [facility] 01/18/2016, 10:39 AM

## 2016-01-18 NOTE — Discharge Instructions (Signed)
Laparoscopic Cholecystectomy, Care After Refer to this sheet in the next few weeks. These instructions provide you with information about caring for yourself after your procedure. Your health care provider may also give you more specific instructions. Your treatment has been planned according to current medical practices, but problems sometimes occur. Call your health care provider if you have any problems or questions after your procedure. WHAT TO EXPECT AFTER THE PROCEDURE After your procedure, it is common to have: 1. Pain at your incision sites. You will be given pain medicines to control your pain. 2. Mild nausea or vomiting. This should improve after the first 24 hours. 3. Bloating and possible shoulder pain from the gas that was used during the procedure. This will improve after the first 24 hours. HOME CARE INSTRUCTIONS Incision Care  Follow instructions from your health care provider about how to take care of your incisions. Make sure you:  Wash your hands with soap and water before you change your bandage (dressing). If soap and water are not available, use hand sanitizer.  Change your dressing as told by your health care provider.  Leave stitches (sutures), skin glue, or adhesive strips in place. These skin closures may need to be in place for 2 weeks or longer. If adhesive strip edges start to loosen and curl up, you may trim the loose edges. Do not remove adhesive strips completely unless your health care provider tells you to do that.  Do not take baths, swim, or use a hot tub until your health care provider approves. Ask your health care provider if you can take showers. You may only be allowed to take sponge baths for bathing. General Instructions  Take over-the-counter and prescription medicines only as told by your health care provider.  Do not drive or operate heavy machinery while taking prescription pain medicine.  Return to your normal diet as told by your health care  provider.  Do not lift anything that is heavier than 10 lb (4.5 kg).  Do not play contact sports for one week or until your health care provider approves. SEEK MEDICAL CARE IF:   You have redness, swelling, or pain at the site of your incision.  You have fluid, blood, or pus coming from your incision.  You notice a bad smell coming from your incision area.  Your surgical incisions break open.  You have a fever. SEEK IMMEDIATE MEDICAL CARE IF:  You develop a rash.  You have difficulty breathing.  You have chest pain.  You have increasing pain in your shoulders (shoulder strap areas).  You faint or have dizzy episodes while you are standing.  You have severe pain in your abdomen.  You have nausea or vomiting that lasts for more than one day.   This information is not intended to replace advice given to you by your health care provider. Make sure you discuss any questions you have with your health care provider.   Document Released: 07/22/2005 Document Revised: 04/12/2015 Document Reviewed: 03/03/2013 Elsevier Interactive Patient Education 2016 [place] ______[facility] LAPAROSCOPIC SURGERY: POST OP INSTRUCTIONS Always review your discharge instruction sheet given to you by the facility where your surgery was performed. IF YOU HAVE DISABILITY OR FAMILY LEAVE FORMS, YOU MUST BRING THEM TO THE OFFICE FOR PROCESSING.   DO NOT GIVE THEM TO YOUR DOCTOR.  1. A prescription for pain medication may be given to you upon discharge.  Take your pain medication as prescribed, if needed.  If  narcotic pain medicine is not needed, then you may take acetaminophen (Tylenol) or ibuprofen (Advil) as needed. 2. Take your usually prescribed medications unless otherwise directed. 3. If you need a refill on your pain medication, please contact your pharmacy.  They will contact our office to request authorization. Prescriptions will not be filled after 5pm or on  week-ends. 4. You should follow a light diet the first few days after arrival home, such as soup and crackers, etc.  Be sure to include lots of fluids daily. 5. Most patients will experience some swelling and bruising in the area of the incisions.  Ice packs will help.  Swelling and bruising can take several days to resolve.  6. It is common to experience some constipation if taking pain medication after surgery.  Increasing fluid intake and taking a stool softener (such as Colace) will usually help or prevent this problem from occurring.  A mild laxative (Milk of Magnesia or Miralax) should be taken according to package instructions if there are no bowel movements after 48 hours. 7. Unless discharge instructions indicate otherwise, you may remove your bandages 24-48 hours after surgery, and you may shower at that time.  You may have steri-strips (small skin tapes) in place directly over the incision.  These strips should be left on the skin for 7-10 days.  If your surgeon used skin glue on the incision, you may shower in 24 hours.  The glue will flake off over the next 2-3 weeks.  Any sutures or staples will be removed at the office during your follow-up visit. 8. ACTIVITIES:  You may resume regular (light) daily activities beginning the next day--such as daily self-care, walking, climbing stairs--gradually increasing activities as tolerated.  You may have sexual intercourse when it is comfortable.  Refrain from any heavy lifting or straining until approved by your doctor. a. You may drive when you are no longer taking prescription pain medication, you can comfortably wear a seatbelt, and you can safely maneuver your car and apply brakes. b. RETURN TO WORK:  __________________________________________________________ 9. You should see your doctor in the office for a follow-up appointment approximately 2-3 weeks after your surgery.  Make sure that you call for this appointment within a day or two after you  arrive home to insure a convenient appointment time. 10. OTHER INSTRUCTIONS: __________________________________________________________________________________________________________________________ __________________________________________________________________________________________________________________________ WHEN TO CALL YOUR DOCTOR: 1. Fever over 101.0 2. Inability to urinate 3. Continued bleeding from incision. 4. Increased pain, redness, or drainage from the incision. 5. Increasing abdominal pain  The clinic staff is available to answer your questions during regular business hours.  Please dont hesitate to call and ask to speak to one of the nurses for clinical concerns.  If you have a medical emergency, go to the nearest emergency room or call 911.  A surgeon from [facility] is always on call at the hospital. [address], [place], [place]  [address] ? [address] [address], [place], [place]   [address] [phone] ? [phone] ? FAX [phone] Web site: [email or web address]  [facility] - DISCHARGE INSTRUCTIONS TO PATIENT  Activity:  Driving - May drive in 3 or 4 days, if doing well and off pain meds   Lifting - No lifting more than 15 pounds for 1 week, then no limit  Wound Care:   May shower  Diet:  As tolerated  Follow up appointment:  Call Dr. Allene Pyo office [facility]) at [phone] for an appointment in 2 to  3 weeks.  Medications and dosages:  Resume your home medications.  Call Dr. Ezzard StandingNewman or his office  [phone]) if you have:  Temperature greater than 100.4,  Persistent nausea and vomiting,  Severe uncontrolled pain,  Redness, tenderness, or signs of infection (pain, swelling, redness, odor or green/yellow discharge around the site),  Any other questions or concerns you may have after discharge.  In an emergency, call 911 or go to an Emergency Department at a nearby hospital.

## 2016-01-18 NOTE — Progress Notes (Signed)
[facility] Office:  [phone] General Surgery Progress Note    POD -  2 Days Post-Op  Assessment/Plan: 1.  LAPAROSCOPIC CHOLECYSTECTOMY WITH INTRAOPERATIVE CHOLANGIOGRAM - 01/16/2016 - D. Avriel Kandel  Doing slowly better.  She is ready to go home. She should complete 7 days of antibiotics from surgery (5 more days from today)  I'll see her back in 2 to 3 weeks (I have put this in the AVS) in our office.  She'll need to call for appt.  2.  Right UPJ stone  I spoke with Dr. Annabell HowellsWrenn and he is going to see her back for follow up 3.  DM 4.  HTN 5.  DVT prophylaxis - SQ Heparin   Principal Problem:   Cholecystitis, acute Active Problems:   Abdominal pain, epigastric   Right nephrolithiasis   Diabetes mellitus without complication (HCC)   Hypertension   Hyperlipidemia   Subjective:  Still does not feel great, but doing better.  Not much of an appetite.  She has a headache.  Objective:   Filed Vitals:   01/17/16 2033 01/18/16 0411  BP: 119/70 134/68  Pulse: 89 99  Temp: 98.6 F (37 C) 100.4 F (38 C)  Resp: 20 20     Intake/Output from previous day:  06/14 0701 - 06/15 0700 In: 2415.4 [P.O.:240; I.V.:2075.4; IV Piggyback:100] Out: -   Intake/Output this shift:  Total I/O In: 120 [P.O.:120] Out: -    Physical Exam:   General: Obese WF who is alert and oriented.    HEENT: Normal. Pupils equal. .   Lungs: Clear   Abdomen: Soft.  BS present.   Wound: Clean   Lab Results:    Recent Labs  01/17/16 0521 01/18/16 0501  WBC 16.0* 13.4*  HGB 11.4* 11.3*  HCT 34.4* 34.4*  PLT 243 299    BMET   Recent Labs  01/17/16 0521 01/18/16 0501  NA 135 135  K 4.3 4.0  CL 102 102  CO2 28 26  GLUCOSE 157* 133*  BUN 8 8  CREATININE 0.78 0.71  CALCIUM 8.2* 8.4*    PT/INR   Recent Labs  01/16/16 0517  LABPROT 15.9*  INR 1.26    ABG  No results for input(s): PHART, HCO3 in the last 72 hours.  Invalid input(s): PCO2, PO2   Studies/Results:  Dg  Cholangiogram Operative  01/16/2016  CLINICAL DATA:  Abdominal pain, acute cholecystitis EXAM: INTRAOPERATIVE CHOLANGIOGRAM TECHNIQUE: Cholangiographic images from the C-arm fluoroscopic device were submitted for interpretation post-operatively. Please see the procedural report for the amount of contrast and the fluoroscopy time utilized. COMPARISON:  Ultrasound 01/15/2016 FINDINGS: No persistent filling defects in the common duct. Intrahepatic ducts are incompletely visualized, appearing decompressed centrally. Contrast passes into the duodenum. : Negative for retained common duct stone. Electronically Signed   By: Corlis Leak  Hassell M.D.   On: 01/16/2016 18:04     Anti-infectives:   Anti-infectives    Start     Dose/Rate Route Frequency Ordered Stop   01/15/16 1800  cefTRIAXone (ROCEPHIN) 2 g in dextrose 5 % 50 mL IVPB     2 g 100 mL/hr over 30 Minutes Intravenous Every 24 hours 01/15/16 1713        Ovidio Kinavid Makye Radle, MD, FACS Pager: [phone] [facility] Office: [phone] 01/18/2016

## 2016-01-18 NOTE — Care Management Note (Signed)
Case Management Note  Patient Details  Name: Robert Bellowatricia M Autry MRN: [id number] Date of Birth: 14-Aug-1955  Subjective/Objective:                    Action/Plan:d/c home no needs or orders.   Expected Discharge Date:   (unknown)               Expected Discharge Plan:  Home/Self Care  In-House Referral:     Discharge planning Services  CM Consult  Post Acute Care Choice:    Choice offered to:     DME Arranged:    DME Agency:     HH Arranged:    HH Agency:     Status of Service:  Completed, signed off  Medicare Important Message Given:    Date Medicare IM Given:    Medicare IM give by:    Date Additional Medicare IM Given:    Additional Medicare Important Message give by:     If discussed at Long Length of Stay Meetings, dates discussed:    Additional Comments:  Lanier ClamMahabir, Kiriana Worthington, RN 01/18/2016, 10:53 AM

## 2016-01-18 NOTE — Progress Notes (Signed)
Completed D/C teaching. Answered all questions. Gave prescriptions.  Pt will be D/C home with family in stable condition. 

## 2016-01-18 NOTE — Plan of Care (Signed)
Problem: Activity: Goal: Risk for activity intolerance will decrease Outcome: Not Progressing Pt refused to ambulate any further than to BR and back to bed.

## 2016-01-24 DIAGNOSIS — E119 Type 2 diabetes mellitus without complications: Secondary | ICD-10-CM | POA: Diagnosis not present

## 2016-01-24 DIAGNOSIS — K8001 Calculus of gallbladder with acute cholecystitis with obstruction: Secondary | ICD-10-CM | POA: Diagnosis not present

## 2016-01-24 DIAGNOSIS — Z09 Encounter for follow-up examination after completed treatment for conditions other than malignant neoplasm: Secondary | ICD-10-CM | POA: Diagnosis not present

## 2016-02-14 DIAGNOSIS — N200 Calculus of kidney: Secondary | ICD-10-CM | POA: Diagnosis not present

## 2016-02-19 ENCOUNTER — Other Ambulatory Visit: Payer: Self-pay | Admitting: Urology

## 2016-03-04 ENCOUNTER — Encounter (HOSPITAL_BASED_OUTPATIENT_CLINIC_OR_DEPARTMENT_OTHER): Payer: Self-pay | Admitting: *Deleted

## 2016-03-04 NOTE — Progress Notes (Addendum)
NPO AFTER MN.  ARRIVE AT 0600.  NEEDS ISTAT.  CURRENT EKG IN CHART AND EPIC.  WILL TAKE ZYRTEC AND TIAZAC AM DOS W/ SIPS OF WATER.

## 2016-03-06 NOTE — H&P (Signed)
CC: I have kidney stones.                   HPI: Kathy [name] is a 60 year-old female established patient who is here for renal calculi.  The problem is on the right side. She first stated noticing pain on approximately 01/14/2016. This is not her first kidney stone. Her first stone was approximately 01/04/1976. She has had 2 stones prior to getting this one. She is currently having flank pain, fever, and chills. She denies having back pain, groin pain, nausea, and vomiting.   She has had ureteroscopy for treatment of her stones in the past.   Kathy [name] returns today in f/u. She had her cholecystectomy and has had relief of the pain. She has a right renal stone but has had no flank pain or hematuria.                       ALLERGIES:          dexamethasone - Skin Rash, tachycardia           MEDICATIONS:           Lisinopril 20 mg tablet  Metformin Hcl 500 mg tablet  Zyrtec  Maxzide-25 Mg 37.5 mg-25 mg tablet  Tiazac 360 mg capsule, extended release  Zetia 10 mg tablet           GU PSH:         Hysterectomy            NON-GU PSH:       Cholecystectomy - 01/16/2016                                   GU PMH:   Epigastric pain - 01/15/2016 Kidney Stone - 01/15/2016 Right upper quadrant pain - 01/15/2016                      PMH Notes: diabetes                 NON-GU PMH:      Acute cholecystitis - 01/15/2016 Anxiety disorder, unspecified Essential (primary) hypertension Pure hypercholesterolemia, unspecified               FAMILY HISTORY:        brain aneurysm - Father Breast Cancer - Mother Kidney Stones - Son, Father            SOCIAL HISTORY:    Marital Status: Married Current Smoking Status: Patient has never smoked.  Drinks 1 drink per week.  Drinks 1 caffeinated drink per day. Patient's occupation Fish farm manager.                     Notes: 2 sons                 REVIEW OF SYSTEMS:                    GU Review Female:   Patient reports get up at night to urinate and  leakage of urine. Patient denies frequent urination, hard to postpone urination, burning /pain with urination, stream starts and stops, trouble starting your stream, have to strain to urinate, and currently pregnant.                  Gastrointestinal (Upper):   Patient denies nausea, vomiting, and  indigestion/ heartburn.                  Gastrointestinal (Lower):   Patient denies diarrhea and constipation.                  Constitutional:   Patient denies fever, night sweats, weight loss, and fatigue.                  Skin:   Patient denies skin rash/ lesion and itching.                  Eyes:   Patient denies blurred vision and double vision.                  Ears/ Nose/ Throat:   Patient denies sore throat and sinus problems.                  Hematologic/Lymphatic:   Patient denies swollen glands and easy bruising.                  Cardiovascular:   Patient denies leg swelling and chest pains.                  Respiratory:   Patient denies cough and shortness of breath.                  Endocrine:   Patient denies excessive thirst.                  Musculoskeletal:   Patient denies joint pain and back pain.                  Neurological:   Patient denies headaches and dizziness.                  Psychologic:   Patient denies depression and anxiety.                  VITAL SIGNS:                         02/14/2016 03:50 PM               BP     145/77 mmHg               Pulse     76 /min               Temperature     98.2 F / 37 C               MULTI-SYSTEM PHYSICAL EXAMINATION:                    Constitutional: Well-nourished. No physical deformities. Normally developed. Good grooming.       Respiratory: No labored breathing, no use of accessory muscles. CTA       Cardiovascular: Normal temperature, normal extremity pulses, no swelling, no varicosities. RRR       Gastrointestinal: Obese abdomen. , no tenderness, no rigidity. no CVAT                 PAST DATA REVIEWED:   Source Of  History:  Patient  Urine Test Review:  Urinalysis  X-Ray Review: KUB: Reviewed Films.     PROCEDURES:    KUB - 74000   A single view of the abdomen is obtained. She has a 9x16mm stone in the area of the right renal  pelvis. No other signficant bone, gas or soft tissue abnormalities noted.               Urinalysis w/Scope - 81001  Dipstick Dipstick Cont'd Micro  Specimen: Voided Bilirubin: Neg WBC/hpf: 0-5/hpf  Color: Yellow Ketones: Neg RBC/hpf: NS (Not Seen)  Appearance: Cloudy Blood: Neg Bacteria: NS (Not Seen)  Specific Gravity: 1.020 Protein: Neg Cystals: Amorph Urates  pH: 6.0 Urobilinogen: 0.2 Casts: NS (Not Seen)  Glucose: Neg Nitrites: Neg Trichomonas: Not Present   Leukocyte Esterase: Neg Mucous: Not Present    Epithelial Cells: 0-5/hpf    Yeast: NS (Not Seen)    Sperm: Not Present    ASSESSMENT:     ICD-10 Details  1 GU:  Kidney Stone - N20.0 Right, 9x11 mm right renal pelvic stone.   PLAN:   Schedule  Return Visit: ASAP - Schedule Surgery  Return Notes: She has a n 11mm right renal pelvic stone but is currently asymptomatic. I discussed the treatment options including PCNL, Ureteroscopy and ESWL and she would like to proceed with Ureteroscopy. Risks reviewed in detail.   Procedure: Approximately 1 Week at [facility] - Cysto Uretero [place] - [address], right  Document  Letter(s):  Created for Patient: Clinical Summary  The risks, benefits, and some of the possible complications of the proposed procedure were discussed with the patient at length and in detail including the possibility of bladder injury, urethral injury, ureteral injury, a postoperative catheter, placement of a ureteral stent, and others.  The possible need for further surgical procedures was discussed with the patient. The possible need for postoperative treatments including further surgical procedures and others was discussed with the patient.   The general risks of the operative  procedure and the perioperative period were discussed with the patient at length and in detail including bleeding, infection, thrombotic events and anesthetic complications.   All of the patient's questions were answered and he voiced an understanding of these risks, benefits and possible complications. The patient gave fully informed consent to proceed with the procedure.

## 2016-03-07 ENCOUNTER — Encounter (HOSPITAL_BASED_OUTPATIENT_CLINIC_OR_DEPARTMENT_OTHER)
Admission: RE | Disposition: A | Discharge status: Home / Self Care | Payer: Self-pay | Source: Ambulatory Visit | Attending: Urology

## 2016-03-07 ENCOUNTER — Encounter (HOSPITAL_BASED_OUTPATIENT_CLINIC_OR_DEPARTMENT_OTHER): Payer: Self-pay | Admitting: *Deleted

## 2016-03-07 ENCOUNTER — Ambulatory Visit (HOSPITAL_BASED_OUTPATIENT_CLINIC_OR_DEPARTMENT_OTHER): Payer: BLUE CROSS/BLUE SHIELD | Admitting: Anesthesiology

## 2016-03-07 ENCOUNTER — Ambulatory Visit (HOSPITAL_BASED_OUTPATIENT_CLINIC_OR_DEPARTMENT_OTHER)
Admission: RE | Admit: 2016-03-07 | Discharge: 2016-03-07 | Disposition: A | Discharge status: Home / Self Care | Payer: BLUE CROSS/BLUE SHIELD | Source: Ambulatory Visit | Attending: Urology | Admitting: Urology

## 2016-03-07 DIAGNOSIS — I10 Essential (primary) hypertension: Secondary | ICD-10-CM | POA: Insufficient documentation

## 2016-03-07 DIAGNOSIS — N200 Calculus of kidney: Secondary | ICD-10-CM | POA: Insufficient documentation

## 2016-03-07 DIAGNOSIS — Z7984 Long term (current) use of oral hypoglycemic drugs: Secondary | ICD-10-CM | POA: Insufficient documentation

## 2016-03-07 DIAGNOSIS — N135 Crossing vessel and stricture of ureter without hydronephrosis: Secondary | ICD-10-CM | POA: Diagnosis not present

## 2016-03-07 DIAGNOSIS — E7800 Pure hypercholesterolemia, unspecified: Secondary | ICD-10-CM | POA: Insufficient documentation

## 2016-03-07 DIAGNOSIS — Z79899 Other long term (current) drug therapy: Secondary | ICD-10-CM | POA: Insufficient documentation

## 2016-03-07 DIAGNOSIS — E119 Type 2 diabetes mellitus without complications: Secondary | ICD-10-CM | POA: Diagnosis not present

## 2016-03-07 HISTORY — DX: Type 2 diabetes mellitus without complications: E11.9

## 2016-03-07 HISTORY — DX: Calculus of kidney: N20.0

## 2016-03-07 HISTORY — PX: CYSTOSCOPY WITH RETROGRADE PYELOGRAM, URETEROSCOPY AND STENT PLACEMENT: SHX5789

## 2016-03-07 HISTORY — DX: Unspecified osteoarthritis, unspecified site: M19.90

## 2016-03-07 HISTORY — DX: Presence of spectacles and contact lenses: Z97.3

## 2016-03-07 LAB — POCT I-STAT, CHEM 8
BUN: 21 mg/dL — ABNORMAL HIGH (ref 6–20)
Calcium, Ion: 1.27 mmol/L — ABNORMAL HIGH (ref 1.12–1.23)
Chloride: 101 mmol/L (ref 101–111)
Creatinine, Ser: 0.9 mg/dL (ref 0.44–1.00)
Glucose, Bld: 125 mg/dL — ABNORMAL HIGH (ref 65–99)
HCT: 37 % (ref 36.0–46.0)
Hemoglobin: 12.6 g/dL (ref 12.0–15.0)
Potassium: 3.6 mmol/L (ref 3.5–5.1)
Sodium: 142 mmol/L (ref 135–145)
TCO2: 25 mmol/L (ref 0–100)

## 2016-03-07 LAB — GLUCOSE, CAPILLARY: Glucose-Capillary: 139 mg/dL — ABNORMAL HIGH (ref 65–99)

## 2016-03-07 SURGERY — CYSTOURETEROSCOPY, WITH RETROGRADE PYELOGRAM AND STENT INSERTION
Anesthesia: General | Site: Bladder | Laterality: Right

## 2016-03-07 MED ORDER — OXYCODONE HCL 5 MG PO TABS
5.0000 mg | ORAL_TABLET | ORAL | Status: DC | PRN
  Filled 2016-03-07: qty 2

## 2016-03-07 MED ORDER — OXYCODONE-ACETAMINOPHEN 5-325 MG PO TABS: 1.0000 | ORAL_TABLET | Freq: Four times a day (QID) | ORAL | 0 refills | Status: DC | PRN

## 2016-03-07 MED ORDER — SODIUM CHLORIDE 0.9 % IR SOLN
Status: DC | PRN
  Administered 2016-03-07: 3000 mL via INTRAVESICAL
  Administered 2016-03-07: 1000 mL via INTRAVESICAL

## 2016-03-07 MED ORDER — PHENAZOPYRIDINE HCL 100 MG PO TABS
ORAL_TABLET | ORAL | Status: AC
  Filled 2016-03-07: qty 2

## 2016-03-07 MED ORDER — PHENAZOPYRIDINE HCL 200 MG PO TABS: 200.0000 mg | ORAL_TABLET | Freq: Three times a day (TID) | ORAL | 1 refills | Status: DC | PRN

## 2016-03-07 MED ORDER — MIDAZOLAM HCL 2 MG/2ML IJ SOLN
INTRAMUSCULAR | Status: AC
  Filled 2016-03-07: qty 2

## 2016-03-07 MED ORDER — DEXAMETHASONE SODIUM PHOSPHATE 10 MG/ML IJ SOLN
INTRAMUSCULAR | Status: AC
  Filled 2016-03-07: qty 1

## 2016-03-07 MED ORDER — FENTANYL CITRATE (PF) 100 MCG/2ML IJ SOLN
INTRAMUSCULAR | Status: DC | PRN
  Administered 2016-03-07 (×2): 50 ug via INTRAVENOUS

## 2016-03-07 MED ORDER — BELLADONNA ALKALOIDS-OPIUM 16.2-60 MG RE SUPP
RECTAL | Status: DC | PRN
  Administered 2016-03-07: 1 via RECTAL

## 2016-03-07 MED ORDER — SODIUM CHLORIDE 0.9% FLUSH
3.0000 mL | Freq: Two times a day (BID) | INTRAVENOUS | Status: DC
  Filled 2016-03-07: qty 3

## 2016-03-07 MED ORDER — IOHEXOL 300 MG/ML  SOLN
INTRAMUSCULAR | Status: DC | PRN
  Administered 2016-03-07: 4 mL

## 2016-03-07 MED ORDER — PROPOFOL 10 MG/ML IV BOLUS
INTRAVENOUS | Status: DC | PRN
  Administered 2016-03-07: 160 mg via INTRAVENOUS

## 2016-03-07 MED ORDER — CEFAZOLIN SODIUM-DEXTROSE 2-4 GM/100ML-% IV SOLN
INTRAVENOUS | Status: AC
  Filled 2016-03-07: qty 100

## 2016-03-07 MED ORDER — FENTANYL CITRATE (PF) 100 MCG/2ML IJ SOLN
25.0000 ug | INTRAMUSCULAR | Status: DC | PRN
  Filled 2016-03-07: qty 1

## 2016-03-07 MED ORDER — SODIUM CHLORIDE 0.9 % IV SOLN
250.0000 mL | INTRAVENOUS | Status: DC | PRN
  Filled 2016-03-07: qty 250

## 2016-03-07 MED ORDER — LIDOCAINE HCL (CARDIAC) 20 MG/ML IV SOLN
INTRAVENOUS | Status: DC | PRN
  Administered 2016-03-07: 60 mg via INTRAVENOUS

## 2016-03-07 MED ORDER — SODIUM CHLORIDE 0.9% FLUSH
3.0000 mL | INTRAVENOUS | Status: DC | PRN
  Filled 2016-03-07: qty 3

## 2016-03-07 MED ORDER — MEPERIDINE HCL 25 MG/ML IJ SOLN
6.2500 mg | INTRAMUSCULAR | Status: DC | PRN
  Filled 2016-03-07: qty 1

## 2016-03-07 MED ORDER — ACETAMINOPHEN 325 MG PO TABS
650.0000 mg | ORAL_TABLET | ORAL | Status: DC | PRN
  Filled 2016-03-07: qty 2

## 2016-03-07 MED ORDER — FENTANYL CITRATE (PF) 100 MCG/2ML IJ SOLN
INTRAMUSCULAR | Status: AC
  Filled 2016-03-07: qty 2

## 2016-03-07 MED ORDER — HYDROMORPHONE HCL 1 MG/ML IJ SOLN
0.2500 mg | INTRAMUSCULAR | Status: DC | PRN
  Filled 2016-03-07: qty 1

## 2016-03-07 MED ORDER — ACETAMINOPHEN 650 MG RE SUPP
650.0000 mg | RECTAL | Status: DC | PRN
  Filled 2016-03-07: qty 1

## 2016-03-07 MED ORDER — PROPOFOL 10 MG/ML IV BOLUS
INTRAVENOUS | Status: AC
  Filled 2016-03-07: qty 40

## 2016-03-07 MED ORDER — MIDAZOLAM HCL 5 MG/5ML IJ SOLN
INTRAMUSCULAR | Status: DC | PRN
  Administered 2016-03-07: 2 mg via INTRAVENOUS

## 2016-03-07 MED ORDER — ONDANSETRON HCL 4 MG/2ML IJ SOLN
INTRAMUSCULAR | Status: AC
  Filled 2016-03-07: qty 2

## 2016-03-07 MED ORDER — BELLADONNA ALKALOIDS-OPIUM 16.2-60 MG RE SUPP
RECTAL | Status: AC
  Filled 2016-03-07: qty 1

## 2016-03-07 MED ORDER — ARTIFICIAL TEARS OP OINT
TOPICAL_OINTMENT | OPHTHALMIC | Status: AC
  Filled 2016-03-07: qty 3.5

## 2016-03-07 MED ORDER — LIDOCAINE HCL (CARDIAC) 20 MG/ML IV SOLN
INTRAVENOUS | Status: AC
  Filled 2016-03-07: qty 5

## 2016-03-07 MED ORDER — ONDANSETRON HCL 4 MG/2ML IJ SOLN
4.0000 mg | Freq: Once | INTRAMUSCULAR | Status: DC | PRN
  Filled 2016-03-07: qty 2

## 2016-03-07 MED ORDER — ONDANSETRON HCL 4 MG/2ML IJ SOLN
INTRAMUSCULAR | Status: DC | PRN
  Administered 2016-03-07: 4 mg via INTRAVENOUS

## 2016-03-07 MED ORDER — PHENAZOPYRIDINE HCL 200 MG PO TABS
200.0000 mg | ORAL_TABLET | Freq: Once | ORAL | Status: AC
  Administered 2016-03-07: 200 mg via ORAL
  Filled 2016-03-07: qty 1

## 2016-03-07 MED ORDER — LACTATED RINGERS IV SOLN
INTRAVENOUS | Status: DC
  Administered 2016-03-07 (×2): via INTRAVENOUS
  Filled 2016-03-07: qty 1000

## 2016-03-07 MED ORDER — CEFAZOLIN SODIUM-DEXTROSE 2-4 GM/100ML-% IV SOLN
2.0000 g | INTRAVENOUS | Status: AC
  Administered 2016-03-07: 2 g via INTRAVENOUS
  Filled 2016-03-07: qty 100

## 2016-03-07 SURGICAL SUPPLY — 15 items
BAG DRAIN URO-CYSTO SKYTR STRL (DRAIN) ×2 IMPLANT
BAG DRN UROCATH (DRAIN) ×1
CATH URET 5FR 28IN OPEN ENDED (CATHETERS) ×1 IMPLANT
CLOTH BEACON ORANGE TIMEOUT ST (SAFETY) ×2 IMPLANT
GLOVE SURG SS PI 8.0 STRL IVOR (GLOVE) ×2 IMPLANT
GOWN STRL REUS W/ TWL XL LVL3 (GOWN DISPOSABLE) ×1 IMPLANT
GOWN STRL REUS W/TWL XL LVL3 (GOWN DISPOSABLE) ×4
GUIDEWIRE STR DUAL SENSOR (WIRE) ×2 IMPLANT
IV NS IRRIG 3000ML ARTHROMATIC (IV SOLUTION) ×3 IMPLANT
IV SOD CHL 0.9% 1000ML (IV SOLUTION) ×1 IMPLANT
KIT ROOM TURNOVER WOR (KITS) ×2 IMPLANT
MANIFOLD NEPTUNE II (INSTRUMENTS) ×1 IMPLANT
PACK CYSTO (CUSTOM PROCEDURE TRAY) ×2 IMPLANT
SHEATH ACCESS URETERAL 38CM (SHEATH) ×1 IMPLANT
STENT URET 6FRX24 CONTOUR (STENTS) ×1 IMPLANT

## 2016-03-07 NOTE — Transfer of Care (Signed)
  Last Vitals:  Vitals:   03/07/16 0606  BP: (!) 146/76  Pulse: 72  Resp: 16  Temp: 36.8 C    Last Pain:  Vitals:   03/07/16 0606  TempSrc: Oral      Patients Stated Pain Goal: 5 (03/07/16 4158)  Immediate Anesthesia Transfer of Care Note  Patient: Kathy [name]  Procedure(s) Performed: Procedure(s) (LRB): CYSTOSCOPY WITH RETROGRADE PYELOGRAM, URETEROSCOPY AND STENT PLACEMENT  (Right)  Patient Location: PACU  Anesthesia Type: General  Level of Consciousness: awake, alert  and oriented  Airway & Oxygen Therapy: Patient Spontanous Breathing and Patient connected to nasal cannula oxygen  Post-op Assessment: Report given to PACU RN and Post -op Vital signs reviewed and stable  Post vital signs: Reviewed and stable  Complications: No apparent anesthesia complications

## 2016-03-07 NOTE — Anesthesia Postprocedure Evaluation (Signed)
Anesthesia Post Note  Patient: Kathy [name]  Procedure(s) Performed: Procedure(s) (LRB): CYSTOSCOPY WITH RETROGRADE PYELOGRAM, URETEROSCOPY AND STENT PLACEMENT  (Right)  Patient location during evaluation: PACU Anesthesia Type: General Level of consciousness: awake and alert Pain management: pain level controlled Vital Signs Assessment: post-procedure vital signs reviewed and stable Respiratory status: spontaneous breathing, nonlabored ventilation, respiratory function stable and patient connected to nasal cannula oxygen Cardiovascular status: blood pressure returned to baseline and stable Postop Assessment: no signs of nausea or vomiting Anesthetic complications: no    Last Vitals:  Vitals:   03/07/16 0900 03/07/16 1017  BP: 115/67 (!) 123/59  Pulse: (!) 57 (!) 51  Resp: 16 16  Temp:  36.6 C    Last Pain:  Vitals:   03/07/16 1017  TempSrc: Oral                 Juanelle Trueheart DAVID

## 2016-03-07 NOTE — Brief Op Note (Signed)
03/07/2016  7:56 AM  PATIENT:  Kathy [name]  60 y.o. female  PRE-OPERATIVE DIAGNOSIS:  RIGHT RENAL STONE  POST-OPERATIVE DIAGNOSIS:  * No post-op diagnosis entered *  PROCEDURE:  Procedure(s): CYSTOSCOPY WITH RETROGRADE PYELOGRAM, URETEROSCOPY AND STENT PLACEMENT HOLMIUM LASER LITHOTRIPSY (Right)  SURGEON:  Surgeon(s) and Role:    * Bjorn Pippin, MD - Primary  PHYSICIAN ASSISTANT:   ASSISTANTS: none   ANESTHESIA:   general  EBL:  No intake/output data recorded.  BLOOD ADMINISTERED:none  DRAINS: 6 x 24 right JJ stent   LOCAL MEDICATIONS USED:  NONE  SPECIMEN:  No Specimen  DISPOSITION OF SPECIMEN:  N/A  COUNTS:  YES  TOURNIQUET:  * No tourniquets in log *  DICTATION: .Other Dictation: Dictation Number [id number]  PLAN OF CARE: Discharge to home after PACU  PATIENT DISPOSITION:  PACU - hemodynamically stable.   Delay start of Pharmacological VTE agent (>24hrs) due to surgical blood loss or risk of bleeding: not applicable

## 2016-03-07 NOTE — Anesthesia Procedure Notes (Signed)
Procedure Name: LMA Insertion Date/Time: 03/07/2016 7:34 AM Performed by: Arta Bruce Pre-anesthesia Checklist: Patient identified, Emergency Drugs available, Suction available and Patient being monitored Patient Re-evaluated:Patient Re-evaluated prior to inductionOxygen Delivery Method: Circle system utilized Preoxygenation: Pre-oxygenation with 100% oxygen Intubation Type: IV induction Ventilation: Mask ventilation without difficulty LMA: LMA inserted LMA Size: 4.0 Number of attempts: 1 Airway Equipment and Method: Bite block Placement Confirmation: positive ETCO2 Tube secured with: Tape Dental Injury: Teeth and Oropharynx as per pre-operative assessment

## 2016-03-07 NOTE — Anesthesia Preprocedure Evaluation (Signed)
Anesthesia Evaluation  Patient identified by MRN, date of birth, ID band Patient awake    Reviewed: Allergy & Precautions, NPO status , Patient's Chart, lab work & pertinent test results  Airway Mallampati: I  TM Distance: >3 FB Neck ROM: Full    Dental   Pulmonary    Pulmonary exam normal        Cardiovascular hypertension, Pt. on medications Normal cardiovascular exam     Neuro/Psych Anxiety    GI/Hepatic   Endo/Other  diabetes  Renal/GU      Musculoskeletal   Abdominal   Peds  Hematology   Anesthesia Other Findings   Reproductive/Obstetrics                             Anesthesia Physical Anesthesia Plan  ASA: II  Anesthesia Plan: General   Post-op Pain Management:    Induction: Intravenous  Airway Management Planned: LMA  Additional Equipment:   Intra-op Plan:   Post-operative Plan: Extubation in OR  Informed Consent: I have reviewed the patients History and Physical, chart, labs and discussed the procedure including the risks, benefits and alternatives for the proposed anesthesia with the patient or authorized representative who has indicated his/her understanding and acceptance.     Plan Discussed with: CRNA and Surgeon  Anesthesia Plan Comments:         Anesthesia Quick Evaluation

## 2016-03-07 NOTE — Discharge Instructions (Addendum)
Post Anesthesia Home Care Instructions  Activity: Get plenty of rest for the remainder of the day. A responsible adult should stay with you for 24 hours following the procedure.  For the next 24 hours, DO NOT: -Drive a car -Paediatric nurse -Drink alcoholic beverages -Take any medication unless instructed by your physician -Make any legal decisions or sign important papers.  Meals: Start with liquid foods such as gelatin or soup. Progress to regular foods as tolerated. Avoid greasy, spicy, heavy foods. If nausea and/or vomiting occur, drink only clear liquids until the nausea and/or vomiting subsides. Call your physician if vomiting continues.  Special Instructions/Symptoms: Your throat may feel dry or sore from the anesthesia or the breathing tube placed in your throat during surgery. If this causes discomfort, gargle with warm salt water. The discomfort should disappear within 24 hours.  If you had a scopolamine patch placed behind your ear for the management of post- operative nausea and/or vomiting:  1. The medication in the patch is effective for 72 hours, after which it should be removed.  Wrap patch in a tissue and discard in the trash. Wash hands thoroughly with soap and water. 2. You may remove the patch earlier than 72 hours if you experience unpleasant side effects which may include dry mouth, dizziness or visual disturbances. 3. Avoid touching the patch. Wash your hands with soap and water after contact with the patch.   Ureteral Stent Implantation, Care After Refer to this sheet in the next few weeks. These instructions provide you with information on caring for yourself after your procedure. Your health care provider may also give you more specific instructions. Your treatment has been planned according to current medical practices, but problems sometimes occur. Call your health care provider if you have any problems or questions after your procedure. WHAT TO EXPECT AFTER  THE PROCEDURE You should be back to normal activity within 48 hours after the procedure. Nausea and vomiting may occur and are commonly the result of anesthesia. It is common to experience sharp pain in the back or lower abdomen and penis with voiding. This is caused by movement of the ends of the stent with the act of urinating.It usually goes away within minutes after you have stopped urinating. HOME CARE INSTRUCTIONS Make sure to drink plenty of fluids. You may have small amounts of bleeding, causing your urine to be red. This is normal. Certain movements may trigger pain or a feeling that you need to urinate. You may be given medicines to prevent infection or bladder spasms. Be sure to take all medicines as directed. Only take over-the-counter or prescription medicines for pain, discomfort, or fever as directed by your health care provider. Do not take aspirin, as this can make bleeding worse. Your stent will be left in until the blockage is resolved. This may take 2 weeks or longer, depending on the reason for stent implantation. You may have an X-ray exam to make sure your ureter is open and that the stent has not moved out of position (migrated). The stent can be removed by your health care provider in the office. Medicines may be given for comfort while the stent is being removed. Be sure to keep all follow-up appointments so your health care provider can check that you are healing properly. SEEK MEDICAL CARE IF:  You experience increasing pain.  Your pain medicine is not working. SEEK IMMEDIATE MEDICAL CARE IF:  Your urine is dark red or has blood clots.  You are leaking  urine (incontinent).  You have a fever, chills, feeling sick to your stomach (nausea), or vomiting.  Your pain is not relieved by pain medicine.  The end of the stent comes out of the urethra.  You are unable to urinate.  The ureter which is the tube from the kidney to the bladder was very tight and I was unable to  safely dilate it to allow passage of the scope.  I placed the stent which will gently dilate the ureter over the next several days and will allow subsequent access to the stone.      This information is not intended to replace advice given to you by your health care provider. Make sure you discuss any questions you have with your health care provider.   Document Released: 03/24/2013 Document Revised: 07/27/2013 Document Reviewed: 02/03/2015 Elsevier Interactive Patient Education 2016 [employer].  [facility] [phone] Post Ureteroscopy With or Without Stent Instructions  Definitions:  Ureter: The duct that transports urine from the kidney to the bladder. Stent:   A plastic hollow tube that is placed into the ureter, from the kidney to the                 bladder to prevent the ureter from swelling shut.  GENERAL INSTRUCTIONS:  Despite the fact that no skin incisions were used, the area around the ureter and bladder is raw and irritated. The stent is a foreign body which will further irritate the bladder wall. This irritation is manifested by increased frequency of urination, both day and night, and by an increase in the urge to urinate. In some, the urge to urinate is present almost always. Sometimes the urge is strong enough that you may not be able to stop yourself from urinating. The only real cure is to remove the stent and then give time for the bladder wall to heal which can't be done until the danger of the ureter swelling shut has passed, which varies.  You may see some blood in your urine while the stent is in place and a few days afterwards. Do not be alarmed, even if the urine was clear for a while. Get off your feet and drink lots of fluids until clearing occurs. If you start to pass clots or don't improve, call us.  DIET: You may return to your normal diet immediately. Because of the raw surface of your bladder, alcohol, spicy foods, acid type foods and  drinks with caffeine may cause irritation or frequency and should be used in moderation. To keep your urine flowing freely and to avoid constipation, drink plenty of fluids during the day ( 8-10 glasses ). Tip: Avoid cranberry juice because it is very acidic.  ACTIVITY: Your physical activity doesn't need to be restricted. However, if you are very active, you may see some blood in your urine. We suggest that you reduce your activity under these circumstances until the bleeding has stopped.  BOWELS: It is important to keep your bowels regular during the postoperative period. Straining with bowel movements can cause bleeding. A bowel movement every other day is reasonable. Use a mild laxative if needed, such as Milk of Magnesia 2-3 tablespoons, or 2 Dulcolax tablets. Call if you continue to have problems. If you have been taking narcotics for pain, before, during or after your surgery, you may be constipated. Take a laxative if necessary.   MEDICATION: You should resume your pre-surgery medications unless told not to. In addition you will often be given  an antibiotic to prevent infection. These should be taken as prescribed until the bottles are finished unless you are having an unusual reaction to one of the drugs.  PROBLEMS YOU SHOULD REPORT TO [place]:  Fevers over 100.5 Fahrenheit.  Heavy bleeding, or clots ( See above notes about blood in urine ).  Inability to urinate.  Drug reactions ( hives, rash, nausea, vomiting, diarrhea ).  Severe burning or pain with urination that is not improving.  FOLLOW-UP: You will need a follow-up appointment to monitor your progress. Call for this appointment at the number listed above. Usually the first appointment will be about three to fourteen days after your surgery.

## 2016-03-07 NOTE — Anesthesia Procedure Notes (Signed)
Date/Time: 03/07/2016 7:34 AM Performed by: Arta Bruce

## 2016-03-07 NOTE — H&P (Signed)
H&P Date of Service: 01/15/2016 1:00 PM Bjorn PippinJohn Zhania Shaheen, MD  Urology     CC: I have kidney stones.  HPI: Kathy [name] is a 60 year-old female patient who is here for renal calculi.  The problem is on the right side. She first stated noticing pain on approximately 01/14/2016. This is not her first kidney stone. Her first stone was approximately 01/04/1976. She has had 2 stones prior to getting this one. She is currently having flank pain, fever, and chills. She denies having back pain, groin pain, nausea, and vomiting.   She has had ureteroscopy for treatment of her stones in the past.   She had the onset yesterday morning of epigastric area burning. She then had left upper quadrant pain. The pain is severe. She has had no hematuria. She has no nausea or vomiting.      ALLERGIES: dexamethasone - Skin Rash, tachycardia    MEDICATIONS: Lisinopril 20 mg tablet  Metformin Hcl 500 mg tablet  Zyrtec  Maxzide-25 Mg 37.5 mg-25 mg tablet  Tiazac 360 mg capsule, extended release  Zetia 10 mg tablet     GU PSH: Hysterectomy    NON-GU PSH: None   GU PMH: Kidney Stone      PMH Notes: diabetes   NON-GU PMH: Anxiety disorder, unspecified Essential (primary) hypertension Pure hypercholesterolemia, unspecified    FAMILY HISTORY: brain aneurysm - Father Breast Cancer - Mother Kidney Stones - Son, Father   SOCIAL HISTORY: Marital Status: Married Patient has never smoked.  Drinks 1 drink per week.  Drinks 1 caffeinated drink per day. 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     Notes: 2 sons    REVIEW OF SYSTEMS:    GU Review Female:   Patient reports get up at night to urinate and leakage of urine. Patient denies stream starts and stops, currently pregnant, trouble starting your stream, frequent urination, burning /pain with urination, hard to postpone urination, and have to strain to urinate.  Gastrointestinal (Upper):   Patient denies nausea, vomiting, and indigestion/  heartburn.  Gastrointestinal (Lower):   Patient denies diarrhea and constipation.  Constitutional:   Patient denies fever, night sweats, weight loss, and fatigue.  Skin:   Patient denies skin rash/ lesion and itching.  Eyes:   Patient denies blurred vision and double vision.  Ears/ Nose/ Throat:   Patient denies sore throat and sinus problems.  Hematologic/Lymphatic:   Patient denies swollen glands and easy bruising.  Cardiovascular:   Patient denies leg swelling and chest pains.  Respiratory:   Patient denies cough and shortness of breath.  Endocrine:   Patient denies excessive thirst.  Musculoskeletal:   Patient reports back pain. Patient denies joint pain.  Neurological:   Patient denies headaches and dizziness.  Psychologic:   Patient denies depression and anxiety.   Notes: indigestion/heartburn   VITAL SIGNS:    Weight: 219 lb/99.3 kg   Height/Length: 62 in / 157 cm   BP: 126/85 mmHg   Heart Rate: 109 /min   Temp: 98.5 F / 37 C   BMI: 40.1      MULTI-SYSTEM PHYSICAL EXAMINATION:    Constitutional: Obese. No physical deformities. Normally developed. Good grooming. In obvious pain  Neck: Neck symmetrical, not swollen. Normal tracheal position.  Respiratory: No labored breathing, no use of accessory muscles.   Cardiovascular: Normal temperature, normal extremity pulses, no swelling, no varicosities.  Lymphatic: No enlargement, no tenderness neck lymph nodes.  Skin: No paleness, no jaundice, no cyanosis. No lesion, no ulcer, no  rash.  Neurologic / Psychiatric: Oriented to time, oriented to place, oriented to person. No depression, no anxiety, no agitation.  Gastrointestinal: Abdominal tenderness in the epigastric, RUQ and bilateral CVAT. with guarding and rebound. No masses or HSM are noted. No hernias are noted  Ears, Nose, Mouth, and Throat: Left ear no scars, no lesions, no masses. Right ear no scars, no lesions, no masses. Nose no scars, no lesions, no masses. Normal  hearing. Normal lips.  Musculoskeletal: Normal gait and station of head and neck.   PAST DATA REVIEWED:  Source Of History:  Patient  Lab Test Review:   CMP, CBC with Diff  Urine Test Review:   Urinalysis  X-Ray Review: C.T. Urogram: Reviewed Films. Reviewed Report.     PROCEDURES:          Urinalysis - 81003 Dipstick Dipstick Cont'd  Specimen: Voided Bilirubin: Neg  Color: Yellow Ketones: Neg  Appearance: Clear Blood: Neg  Specific Gravity: 1.020 Protein: Neg  pH: 7.0 Urobilinogen: 0.2  Glucose: Neg Nitrites: Neg    Leukocyte Esterase: Neg         Phenergan 25mg  - 96372, J2550 Qty: 25 Adm. By: Rene Paci  Unit: mg Lot No [id number]  Route: IM Exp. Date 07/05/2017  Freq: None Mfgr.:   Site: Right Buttock   ASSESSMENT:      ICD-10 Details  1 GU:   Kidney Stone - N20.0   2   Right upper quadrant pain - R10.11   3   Epigastric pain - R10.13    PLAN:           Schedule Procedure: Unspecified Date at [facility] - [address]199 - Morphine 4mg  (Morphine Per 10 Mg) - 73428, J6811  Procedure: Unspecified Date at [facility] - [address]199 - Phenergan 25mg  (Phenergan Per 50 Mg) - J2550, [id number]          Document Letter(s):  Created for Patient: Clinical Summary         Notes:   She has epigastric and right upper quadrant pain with bilateral CVAT right > left but her stone was non-obstructing on CT and her UA's have been clear. I am concerned that she may have pancreatitis or cholecystitis as a cause of her pain.   I am going to admit her for pain control and further evaluation with abdominal US, additional labs and a Hospitalist consult.    Addendum:   She has recovered from her cholecystectomy and is now to undergo right ureteroscopy for her stone.   She was seen in the office recent but the office H&P didn't copy over satisfactorally.           Admission (Discharged) on 01/15/2016        Routing History          Detailed Report

## 2016-03-08 ENCOUNTER — Encounter (HOSPITAL_BASED_OUTPATIENT_CLINIC_OR_DEPARTMENT_OTHER): Payer: Self-pay | Admitting: Urology

## 2016-03-08 NOTE — Op Note (Signed)
NAMEORVILLA, TRUETT NO.:  [id number]  MEDICAL RECORD NO.:  [id number]  LOCATION:                                 FACILITY:  PHYSICIAN:  Excell Seltzer. Annabell Howells, M.D.    DATE OF BIRTH:  28-Aug-1955  DATE OF PROCEDURE:  03/07/2016 DATE OF DISCHARGE:                              OPERATIVE REPORT   PATIENT OF:  Excell Seltzer. Annabell Howells, M.D.  PROCEDURE:  Cystoscopy with right retrograde pyelogram and interpretation, right ureteroscopy.  PREOPERATIVE DIAGNOSIS:  Right renal pelvic stone.  POSTOPERATIVE DIAGNOSIS:  Right renal pelvic stone with ureteral narrowing.  SURGEON:  Excell Seltzer. Annabell Howells, M.D.  ANESTHESIA:  General.  SPECIMEN:  None.  DRAINS:  A 6-French x 24 cm contour right double-J stent.  BLOOD LOSS:  None.  COMPLICATIONS:  None.  INDICATIONS:  Ms. Schmuck is a 60 year old white female with a history of stones who presented to the office in June with right upper quadrant pain that was atypical for stone.  She did have a 9 mm stone in the right renal pelvis on CT scan, but it was found that her symptoms were coming from her gallbladder.  She subsequently underwent a cholecystectomy, but now returns for ureteroscopic stone management of her 9 mm renal pelvic stone.  She remains asymptomatic.  FINDINGS OF PROCEDURE:  She was taken to the operating room, all antibiotics were given, and general anesthetic was induced, and she was placed in lithotomy position.  She was fitted with PAS hose.  Her perineum and genitalia were prepped with Betadine solution.  She was draped in the usual sterile fashion.  She was prepped with Betadine solution and draped in the usual sterile fashion.  Cystoscopy was performed using the 23-French scope and 30-degree lens. Examination revealed a normal urethra.  The bladder wall was smooth and pale without tumor, stones, or inflammation.  Ureteral orifices were unremarkable. The right ureteral orifice was cannulated with 5-French  open-end catheter and contrast was instilled.  The right retrograde pyelogram revealed a normal but somewhat delicate ureter with a filling defect in the renal pelvis consistent with the known stone.  No other filling defects or hydronephrosis were noted.  A guidewire was then passed to the kidney and the cystoscope was removed.  An attempt was made to pass a 38-cm digital access sheath inner core to the kidney.  I was able to get it to just about the upper pelvic brim, but it would go no further.  I then passed the 4-1/2-French semi-rigid ureteroscope, but was unable to get that beyond the same level.  Inspection revealed some narrowing consistent with a mild stricturing of the ureter.  At this point, it was felt the most appropriate option was to be to place a stent and return at a later date for subsequent ureteroscopy after the ureter had a chance to dilate.  The cystoscope was reinserted over the wire and a 6-French 24-cm contour double-J stent was inserted without difficulty to the kidney.  The wire was removed leaving good coil in the kidney and a good coil in the bladder.  The bladder was drained.  The drapes removed.  B and O suppository were placed.  The drapes removed.  She was taken down from lithotomy position.  Her anesthetic was reversed.  She was moved to the recovery room in stable condition.  There were no complications.     Excell Seltzer. Annabell Howells, M.D.   ______________________________ Excell Seltzer. Annabell Howells, M.D.    JJW/MEDQ  D:  03/07/2016  T:  03/07/2016  Job:  [id number]

## 2016-03-12 ENCOUNTER — Other Ambulatory Visit: Payer: Self-pay | Admitting: Urology

## 2016-03-13 ENCOUNTER — Other Ambulatory Visit: Payer: Self-pay | Admitting: Urology

## 2016-03-18 ENCOUNTER — Encounter (HOSPITAL_COMMUNITY)
Admission: RE | Admit: 2016-03-18 | Discharge: 2016-03-18 | Disposition: A | Discharge status: Home / Self Care | Payer: BLUE CROSS/BLUE SHIELD | Source: Ambulatory Visit | Attending: Urology | Admitting: Urology

## 2016-03-18 ENCOUNTER — Encounter (HOSPITAL_COMMUNITY): Payer: Self-pay

## 2016-03-18 DIAGNOSIS — N200 Calculus of kidney: Secondary | ICD-10-CM | POA: Diagnosis not present

## 2016-03-18 DIAGNOSIS — Z79899 Other long term (current) drug therapy: Secondary | ICD-10-CM | POA: Diagnosis not present

## 2016-03-18 DIAGNOSIS — F419 Anxiety disorder, unspecified: Secondary | ICD-10-CM | POA: Diagnosis not present

## 2016-03-18 DIAGNOSIS — Z7984 Long term (current) use of oral hypoglycemic drugs: Secondary | ICD-10-CM | POA: Diagnosis not present

## 2016-03-18 DIAGNOSIS — I10 Essential (primary) hypertension: Secondary | ICD-10-CM | POA: Diagnosis not present

## 2016-03-18 HISTORY — DX: Pneumonia, unspecified organism: J18.9

## 2016-03-18 HISTORY — DX: Allergy status to unspecified drugs, medicaments and biological substances: Z88.9

## 2016-03-18 LAB — BASIC METABOLIC PANEL
Anion gap: 8 (ref 5–15)
BUN: 21 mg/dL — ABNORMAL HIGH (ref 6–20)
CO2: 25 mmol/L (ref 22–32)
Calcium: 10.6 mg/dL — ABNORMAL HIGH (ref 8.9–10.3)
Chloride: 104 mmol/L (ref 101–111)
Creatinine, Ser: 1.12 mg/dL — ABNORMAL HIGH (ref 0.44–1.00)
GFR calc Af Amer: 60 mL/min — ABNORMAL LOW (ref >60)
GFR calc non Af Amer: 52 mL/min — ABNORMAL LOW (ref >60)
Glucose, Bld: 104 mg/dL — ABNORMAL HIGH (ref 65–99)
Potassium: 4.2 mmol/L (ref 3.5–5.1)
Sodium: 137 mmol/L (ref 135–145)

## 2016-03-18 LAB — CBC
HCT: 42.2 % (ref 36.0–46.0)
Hemoglobin: 13.9 g/dL (ref 12.0–15.0)
MCH: 29.9 pg (ref 26.0–34.0)
MCHC: 32.9 g/dL (ref 30.0–36.0)
MCV: 90.8 fL (ref 78.0–100.0)
Platelets: 414 10*3/uL — ABNORMAL HIGH (ref 150–400)
RBC: 4.65 MIL/uL (ref 3.87–5.11)
RDW: 13.8 % (ref 11.5–15.5)
WBC: 10.9 10*3/uL — ABNORMAL HIGH (ref 4.0–10.5)

## 2016-03-18 NOTE — Patient Instructions (Signed)
Kathy [name]  03/18/2016   Your procedure is scheduled on: 03/21/16  [facility]  Entrance [place]  elevators to 3rd floor to  Short Stay Center at  2:45 PM    Call this number if you have problems the morning of surgery [phone]   Remember: ONLY 1 PERSON MAY GO WITH YOU TO SHORT STAY TO GET  READY MORNING OF YOUR SURGERY.  Do not eat food  :After Midnight.----------MAY HAVE CLEAR LIQUIDS AM OF SURGERY UNTIL 1045 AM-- THEN NOTHING BY MOUTH     Take these medicines the morning of surgery with A SIP OF WATER:   Zyrtec, Diltiazem, Phenazopyridine May take Oxycodone if needed:  DO NOT TAKE ANY DIABETIC MEDICATIONS DAY OF YOUR SURGERY                               You may not have any metal on your body including hair pins and              piercings  Do not wear jewelry, make-up, lotions, powders or perfumes, deodorant             Do not wear nail polish.  Do not shave  48 hours prior to surgery.              Men may shave face and neck.   Do not bring valuables to the hospital. [facility] IS NOT             RESPONSIBLE   FOR VALUABLES.  Contacts, dentures or bridgework may not be worn into surgery.  Leave suitcase in the car. After surgery it may be brought to your room.     Patients discharged the day of surgery will not be allowed to drive home.  Name and phone number of your driver: husband Kathy MilchSean [name] [phone]  Special Instructions: N/A              Please read over the following fact sheets you were given: _____________________________________________________________________             [facility] - Preparing [facility] Before surgery, you can play an important role.  Because skin is not sterile, your skin needs to be as free of germs as possible.  You can reduce the number of germs on your skin by washing with CHG (chlorahexidine gluconate) soap before surgery.  CHG is an antiseptic cleaner which kills germs and bonds with  the skin to continue killing germs even after washing. Please DO NOT use if you have an allergy to CHG or antibacterial soaps.  If your skin becomes reddened/irritated stop using the CHG and inform your nurse when you arrive at Short Stay. Do not shave (including legs and underarms) for at least 48 hours prior to the first CHG shower.  You may shave your face/neck. Please follow these instructions carefully:  1.  Shower with CHG Soap the night before surgery and the  morning of Surgery.  2.  If you choose to wash your hair, wash your hair first as usual with your  normal  shampoo.  3.  After you shampoo, rinse your hair and body thoroughly to remove the  shampoo.  4.  Use CHG as you would any other liquid soap.  You can apply chg directly  to the skin and wash                       Gently with a scrungie or clean washcloth.  5.  Apply the CHG Soap to your body ONLY FROM THE NECK DOWN.   Do not use on face/ open                           Wound or open sores. Avoid contact with eyes, ears mouth and genitals (private parts).                       Wash face,  Genitals (private parts) with your normal soap.             6.  Wash thoroughly, paying special attention to the area where your surgery  will be performed.  7.  Thoroughly rinse your body with warm water from the neck down.  8.  DO NOT shower/wash with your normal soap after using and rinsing off  the CHG Soap.                9.  Pat yourself dry with a clean towel.            10.  Wear clean pajamas.            11.  Place clean sheets on your bed the night of your first shower and do not  sleep with pets. Day of Surgery : Do not apply any lotions/deodorants the morning of surgery.  Please wear clean clothes to the hospital/surgery center.  FAILURE TO FOLLOW THESE INSTRUCTIONS MAY RESULT IN THE CANCELLATION OF YOUR SURGERY PATIENT SIGNATURE_________________________________  NURSE  SIGNATURE__________________________________  ________________________________________________________________________    CLEAR LIQUID DIET   Foods Allowed                                                                     Foods Excluded  Coffee and tea, regular and decaf                             liquids that you cannot  Plain Jell-O in any flavor                                             see through such as: Fruit ices (not with fruit pulp)                                     milk, soups, orange juice  Iced Popsicles                                    All solid food Carbonated beverages, regular and diet  Cranberry, grape and apple juices Sports drinks like Gatorade Lightly seasoned clear broth or consume(fat free) Sugar, honey syrup  Sample Menu Breakfast                                Lunch                                     Supper Cranberry juice                    Beef broth                            Chicken broth Jell-O                                     Grape juice                           Apple juice Coffee or tea                        Jell-O                                      Popsicle                                                Coffee or tea                        Coffee or tea  _____________________________________________________________________

## 2016-03-18 NOTE — Progress Notes (Signed)
ekg 6/17 epic 

## 2016-03-19 LAB — HEMOGLOBIN A1C
Hgb A1c MFr Bld: 6.2 % — ABNORMAL HIGH (ref 4.8–5.6)
Mean Plasma Glucose: 131 mg/dL

## 2016-03-20 NOTE — Anesthesia Preprocedure Evaluation (Addendum)
Anesthesia Evaluation  Patient identified by MRN, date of birth, ID band Patient awake    Reviewed: Allergy & Precautions, NPO status , Patient's Chart, lab work & pertinent test results  Airway Mallampati: I  TM Distance: >3 FB Neck ROM: Full    Dental  (+) Teeth Intact, Dental Advisory Given   Pulmonary    Pulmonary exam normal breath sounds clear to auscultation       Cardiovascular hypertension, Pt. on medications Normal cardiovascular exam Rhythm:Regular     Neuro/Psych Anxiety    GI/Hepatic   Endo/Other  diabetes, Oral Hypoglycemic Agents  Renal/GU Stones     Musculoskeletal   Abdominal   Peds  Hematology   Anesthesia Other Findings   Reproductive/Obstetrics SP Hysterectomy                            Anesthesia Physical Anesthesia Plan  ASA: II  Anesthesia Plan: General   Post-op Pain Management:    Induction: Intravenous  Airway Management Planned: LMA and Oral ETT  Additional Equipment:   Intra-op Plan:   Post-operative Plan: Extubation in OR  Informed Consent: I have reviewed the patients History and Physical, chart, labs and discussed the procedure including the risks, benefits and alternatives for the proposed anesthesia with the patient or authorized representative who has indicated his/her understanding and acceptance.     Plan Discussed with:   Anesthesia Plan Comments: (Has had LMA 4 in past, also has had ETT with MAC 4 grade 2 view)        Anesthesia Quick Evaluation

## 2016-03-21 ENCOUNTER — Ambulatory Visit (HOSPITAL_COMMUNITY)
Admission: RE | Admit: 2016-03-21 | Discharge: 2016-03-21 | Disposition: A | Discharge status: Home / Self Care | Payer: BLUE CROSS/BLUE SHIELD | Source: Ambulatory Visit | Attending: Urology | Admitting: Urology

## 2016-03-21 ENCOUNTER — Ambulatory Visit (HOSPITAL_COMMUNITY): Payer: BLUE CROSS/BLUE SHIELD | Admitting: Anesthesiology

## 2016-03-21 ENCOUNTER — Encounter (HOSPITAL_COMMUNITY): Payer: Self-pay | Admitting: *Deleted

## 2016-03-21 ENCOUNTER — Encounter (HOSPITAL_COMMUNITY)
Admission: RE | Disposition: A | Discharge status: Home / Self Care | Payer: Self-pay | Source: Ambulatory Visit | Attending: Urology

## 2016-03-21 DIAGNOSIS — Z7984 Long term (current) use of oral hypoglycemic drugs: Secondary | ICD-10-CM | POA: Insufficient documentation

## 2016-03-21 DIAGNOSIS — N200 Calculus of kidney: Secondary | ICD-10-CM | POA: Diagnosis not present

## 2016-03-21 DIAGNOSIS — F419 Anxiety disorder, unspecified: Secondary | ICD-10-CM | POA: Diagnosis not present

## 2016-03-21 DIAGNOSIS — I10 Essential (primary) hypertension: Secondary | ICD-10-CM | POA: Diagnosis not present

## 2016-03-21 DIAGNOSIS — Z79899 Other long term (current) drug therapy: Secondary | ICD-10-CM | POA: Diagnosis not present

## 2016-03-21 HISTORY — PX: HOLMIUM LASER APPLICATION: SHX5852

## 2016-03-21 HISTORY — PX: CYSTOSCOPY WITH RETROGRADE PYELOGRAM, URETEROSCOPY AND STENT PLACEMENT: SHX5789

## 2016-03-21 LAB — GLUCOSE, CAPILLARY
Glucose-Capillary: 101 mg/dL — ABNORMAL HIGH (ref 65–99)
Glucose-Capillary: 123 mg/dL — ABNORMAL HIGH (ref 65–99)

## 2016-03-21 SURGERY — CYSTOURETEROSCOPY, WITH RETROGRADE PYELOGRAM AND STENT INSERTION
Anesthesia: General | Site: Urethra | Laterality: Right

## 2016-03-21 MED ORDER — FENTANYL CITRATE (PF) 100 MCG/2ML IJ SOLN
INTRAMUSCULAR | Status: AC
  Filled 2016-03-21: qty 2

## 2016-03-21 MED ORDER — OXYCODONE HCL 5 MG PO TABS
5.0000 mg | ORAL_TABLET | ORAL | Status: DC | PRN
  Administered 2016-03-21: 10 mg via ORAL

## 2016-03-21 MED ORDER — SODIUM CHLORIDE 0.9% FLUSH: 3.0000 mL | Freq: Two times a day (BID) | INTRAVENOUS | Status: DC

## 2016-03-21 MED ORDER — FENTANYL CITRATE (PF) 100 MCG/2ML IJ SOLN
INTRAMUSCULAR | Status: DC | PRN
Start: 2016-03-21 — End: 2016-03-21
  Administered 2016-03-21 (×6): 50 ug via INTRAVENOUS

## 2016-03-21 MED ORDER — LIDOCAINE HCL (CARDIAC) 20 MG/ML IV SOLN
INTRAVENOUS | Status: DC | PRN
  Administered 2016-03-21: 100 mg via INTRAVENOUS

## 2016-03-21 MED ORDER — LIDOCAINE HCL (CARDIAC) 20 MG/ML IV SOLN
INTRAVENOUS | Status: AC
Start: 2016-03-21 — End: 2016-03-21
  Filled 2016-03-21: qty 5

## 2016-03-21 MED ORDER — FENTANYL CITRATE (PF) 100 MCG/2ML IJ SOLN
25.0000 ug | INTRAMUSCULAR | Status: DC | PRN
  Administered 2016-03-21 (×3): 50 ug via INTRAVENOUS

## 2016-03-21 MED ORDER — ONDANSETRON HCL 4 MG/2ML IJ SOLN
INTRAMUSCULAR | Status: DC | PRN
  Administered 2016-03-21: 4 mg via INTRAVENOUS

## 2016-03-21 MED ORDER — PROPOFOL 10 MG/ML IV BOLUS
INTRAVENOUS | Status: DC | PRN
  Administered 2016-03-21: 170 mg via INTRAVENOUS

## 2016-03-21 MED ORDER — MIDAZOLAM HCL 5 MG/5ML IJ SOLN
INTRAMUSCULAR | Status: DC | PRN
  Administered 2016-03-21: 2 mg via INTRAVENOUS

## 2016-03-21 MED ORDER — ACETAMINOPHEN 325 MG PO TABS: 650.0000 mg | ORAL_TABLET | ORAL | Status: DC | PRN

## 2016-03-21 MED ORDER — ONDANSETRON HCL 4 MG/2ML IJ SOLN
INTRAMUSCULAR | Status: AC
  Filled 2016-03-21: qty 2

## 2016-03-21 MED ORDER — ACETAMINOPHEN 650 MG RE SUPP: 650.0000 mg | RECTAL | Status: DC | PRN

## 2016-03-21 MED ORDER — CEFAZOLIN SODIUM-DEXTROSE 2-4 GM/100ML-% IV SOLN
INTRAVENOUS | Status: AC
  Filled 2016-03-21: qty 100

## 2016-03-21 MED ORDER — MIDAZOLAM HCL 2 MG/2ML IJ SOLN
INTRAMUSCULAR | Status: AC
  Filled 2016-03-21: qty 2

## 2016-03-21 MED ORDER — PROPOFOL 10 MG/ML IV BOLUS
INTRAVENOUS | Status: AC
  Filled 2016-03-21: qty 20

## 2016-03-21 MED ORDER — DEXAMETHASONE SODIUM PHOSPHATE 10 MG/ML IJ SOLN
INTRAMUSCULAR | Status: AC
  Filled 2016-03-21: qty 1

## 2016-03-21 MED ORDER — PROMETHAZINE HCL 25 MG/ML IJ SOLN: 6.2500 mg | INTRAMUSCULAR | Status: DC | PRN

## 2016-03-21 MED ORDER — CEFAZOLIN SODIUM-DEXTROSE 2-4 GM/100ML-% IV SOLN: 2.0000 g | INTRAVENOUS | Status: DC

## 2016-03-21 MED ORDER — SODIUM CHLORIDE 0.9 % IR SOLN
Status: DC | PRN
  Administered 2016-03-21: 3000 mL via INTRAVESICAL
  Administered 2016-03-21: 1000 mL via INTRAVESICAL

## 2016-03-21 MED ORDER — CEFAZOLIN SODIUM-DEXTROSE 2-4 GM/100ML-% IV SOLN
2.0000 g | INTRAVENOUS | Status: AC
  Administered 2016-03-21: 2 g via INTRAVENOUS
  Filled 2016-03-21: qty 100

## 2016-03-21 MED ORDER — FENTANYL CITRATE (PF) 100 MCG/2ML IJ SOLN
INTRAMUSCULAR | Status: AC
  Administered 2016-03-21: 50 ug via INTRAVENOUS
  Filled 2016-03-21: qty 2

## 2016-03-21 MED ORDER — LACTATED RINGERS IV SOLN
INTRAVENOUS | Status: DC
  Administered 2016-03-21 (×2): via INTRAVENOUS

## 2016-03-21 MED ORDER — OXYCODONE HCL 5 MG PO TABS
ORAL_TABLET | ORAL | Status: AC
  Filled 2016-03-21: qty 2

## 2016-03-21 MED ORDER — SODIUM CHLORIDE 0.9% FLUSH: 3.0000 mL | INTRAVENOUS | Status: DC | PRN

## 2016-03-21 MED ORDER — OXYCODONE-ACETAMINOPHEN 5-325 MG PO TABS
1.0000 | ORAL_TABLET | Freq: Four times a day (QID) | ORAL | 0 refills | Status: DC | PRN
Start: 2016-03-21 — End: 2017-10-09

## 2016-03-21 MED ORDER — FENTANYL CITRATE (PF) 100 MCG/2ML IJ SOLN: 25.0000 ug | INTRAMUSCULAR | Status: DC | PRN

## 2016-03-21 MED ORDER — SODIUM CHLORIDE 0.9 % IV SOLN: 250.0000 mL | INTRAVENOUS | Status: DC | PRN

## 2016-03-21 MED ORDER — MEPERIDINE HCL 50 MG/ML IJ SOLN: 6.2500 mg | INTRAMUSCULAR | Status: DC | PRN

## 2016-03-21 MED ORDER — PHENYLEPHRINE HCL 10 MG/ML IJ SOLN
INTRAMUSCULAR | Status: DC | PRN
Start: 2016-03-21 — End: 2016-03-21
  Administered 2016-03-21 (×2): 40 ug via INTRAVENOUS

## 2016-03-21 MED ORDER — PHENYLEPHRINE 40 MCG/ML (10ML) SYRINGE FOR IV PUSH (FOR BLOOD PRESSURE SUPPORT)
PREFILLED_SYRINGE | INTRAVENOUS | Status: AC
  Filled 2016-03-21: qty 10

## 2016-03-21 SURGICAL SUPPLY — 24 items
BAG URO CATCHER STRL LF (MISCELLANEOUS) ×3 IMPLANT
BASKET LASER NITINOL 1.9FR (BASKET) IMPLANT
BASKET STONE NCOMPASS (UROLOGICAL SUPPLIES) IMPLANT
BSKT STON RTRVL 120 1.9FR (BASKET)
CATH URET 5FR 28IN OPEN ENDED (CATHETERS) ×1 IMPLANT
CATH URET DUAL LUMEN 6-10FR 50 (CATHETERS) ×3 IMPLANT
CLOTH BEACON ORANGE TIMEOUT ST (SAFETY) ×3 IMPLANT
EXTRACTOR STONE NITINOL NGAGE (UROLOGICAL SUPPLIES) ×1 IMPLANT
FIBER LASER FLEXIVA 1000 (UROLOGICAL SUPPLIES) IMPLANT
FIBER LASER FLEXIVA 365 (UROLOGICAL SUPPLIES) IMPLANT
FIBER LASER FLEXIVA 550 (UROLOGICAL SUPPLIES) IMPLANT
FIBER LASER TRAC TIP (UROLOGICAL SUPPLIES) ×1 IMPLANT
GLOVE SURG SS PI 8.0 STRL IVOR (GLOVE) ×1 IMPLANT
GOWN STRL REUS W/TWL XL LVL3 (GOWN DISPOSABLE) ×3 IMPLANT
GUIDEWIRE STR DUAL SENSOR (WIRE) ×3 IMPLANT
IV NS 1000ML (IV SOLUTION) ×3
IV NS 1000ML BAXH (IV SOLUTION) ×2 IMPLANT
IV NS IRRIG 3000ML ARTHROMATIC (IV SOLUTION) ×3 IMPLANT
MANIFOLD NEPTUNE II (INSTRUMENTS) ×3 IMPLANT
PACK CYSTO (CUSTOM PROCEDURE TRAY) ×3 IMPLANT
SHEATH ACCESS URETERAL 38CM (SHEATH) ×3 IMPLANT
SHEATH URET ACCESS 10/12FR (MISCELLANEOUS) ×1 IMPLANT
STENT URET 6FRX24 CONTOUR (STENTS) ×1 IMPLANT
TUBING CONNECTING 10 (TUBING) ×3 IMPLANT

## 2016-03-21 NOTE — Anesthesia Procedure Notes (Addendum)
Procedure Name: LMA Insertion Date/Time: 03/21/2016 4:56 PM Performed by: Epimenio SarinJARVELA, Jozelyn Kuwahara R Pre-anesthesia Checklist: Patient identified, Emergency Drugs available, Suction available, Patient being monitored and Timeout performed Patient Re-evaluated:Patient Re-evaluated prior to inductionOxygen Delivery Method: Circle system utilized Preoxygenation: Pre-oxygenation with 100% oxygen Intubation Type: IV induction Ventilation: Mask ventilation without difficulty LMA: LMA with gastric port inserted LMA Size: 4.0 Number of attempts: 1 Dental Injury: Teeth and Oropharynx as per pre-operative assessment

## 2016-03-21 NOTE — Discharge Instructions (Addendum)
CYSTOSCOPY HOME CARE INSTRUCTIONS  Activity: Rest for the remainder of the day.  Do not drive or operate equipment today.  You may resume normal activities in one to two days as instructed by your physician.   Meals: Drink plenty of liquids and eat light foods such as gelatin or soup this evening.  You may return to a normal meal plan tomorrow.  Return to Work: You may return to work in one to two days or as instructed by your physician.  Special Instructions / Symptoms: Call your physician if any of these symptoms occur:   -persistent or heavy bleeding  -bleeding which continues after first few urination  -large blood clots that are difficult to pass  -urine stream diminishes or stops completely  -fever equal to or higher than 101 degrees Farenheit.  -cloudy urine with a strong, foul odor  -severe pain  Females should always wipe from front to back after elimination.  You may feel some burning pain when you urinate.  This should disappear with time.  Applying moist heat to the lower abdomen or a hot tub bath may help relieve the pain. \  Your stent has a small string attached that is tucked vaginally.  Be careful when wiping.   You may remove the stent by pulling the string on Monday morning.  If you don't feel comfortable doing that, you can come to the office to have it done on Monday.  Bring the stone fragments to the office for your f/u appt.   Patient Signature:  ________________________________________________________  Nurse's Signature:  ________________________________________________________

## 2016-03-21 NOTE — Transfer of Care (Signed)
   Immediate Anesthesia Transfer of Care Note  Patient: Kathy [name]  Procedure(s) Performed: Procedure(s): CYSTOSCOPY WITH RIGHT RETROGRADE PYELOGRAM, RIGHT URETEROSCOPY WITH LASER  AND STENT PLACEMENT (Right) HOLMIUM LASER APPLICATION (N/A)  Patient Location: PACU  Anesthesia Type:General  Level of Consciousness:  sedated, patient cooperative and responds to stimulation  Airway & Oxygen Therapy:Patient Spontanous Breathing and Patient connected to face mask oxgen  Post-op Assessment:  Report given to PACU RN and Post -op Vital signs reviewed and stable  Post vital signs:  Reviewed and stable  Last Vitals:  Vitals:   03/21/16 1429 03/21/16 1452  BP: (!) 172/70 (!) 130/55  Pulse: 70   Resp: 16   Temp: 36.8 C     Complications: No apparent anesthesia complications

## 2016-03-21 NOTE — Interval H&P Note (Signed)
History and Physical Interval Note: She had a stent placed for a tight ureter on 7/12 and returns for stone removal today.  03/21/2016 4:39 PM  [name]  has presented today for surgery, with the diagnosis of right renal stone  The various methods of treatment have been discussed with the patient and family. After consideration of risks, benefits and other options for treatment, the patient has consented to  Procedure(s): CYSTOSCOPY WITH RIGHT RETROGRADE PYELOGRAM, RIGHT URETEROSCOPY WITH LASER  AND STENT PLACEMENT (Right) HOLMIUM LASER APPLICATION (N/A) as a surgical intervention .  The patient's history has been reviewed, patient examined, no change in status, stable for surgery.  I have reviewed the patient's chart and labs.  Questions were answered to the patient's satisfaction.     [name]

## 2016-03-21 NOTE — Brief Op Note (Signed)
03/21/2016  5:51 PM  PATIENT:  Robert BellowPatricia M Solly  60 y.o. female  PRE-OPERATIVE DIAGNOSIS:  right renal stone  POST-OPERATIVE DIAGNOSIS:  right renal stone  PROCEDURE:  Procedure(s): CYSTOSCOPY WITH RIGHT STENT REMOVAL, RIGHT URETEROSCOPY WITH LASER  AND STENT PLACEMENT (Right) HOLMIUM LASER APPLICATION (N/A)  SURGEON:  Surgeon(s) and Role:    * Bjorn PippinJohn Terral Cooks, MD - Primary  PHYSICIAN ASSISTANT:   ASSISTANTS: none   ANESTHESIA:   general  EBL:  No intake/output data recorded.  BLOOD ADMINISTERED:none  DRAINS: 6 x 24 fr right JJ stent with tether.   LOCAL MEDICATIONS USED:  NONE  SPECIMEN:  Source of Specimen:  stone fragments  DISPOSITION OF SPECIMEN:  to patient to bring to office.   COUNTS:  YES  TOURNIQUET:  * No tourniquets in log *  DICTATION: .Other Dictation: Dictation Number [id number]  PLAN OF CARE: Discharge to home after PACU  PATIENT DISPOSITION:  PACU - hemodynamically stable.   Delay start of Pharmacological VTE agent (>24hrs) due to surgical blood loss or risk of bleeding: not applicable

## 2016-03-21 NOTE — H&P (View-Only) (Signed)
H&P Date of Service: 01/15/2016 1:00 PM Bjorn PippinJohn Andre Swander, MD  Urology     CC: I have kidney stones.  HPI: Kathy [name] is a 60 year-old female patient who is here for renal calculi.  The problem is on the right side. She first stated noticing pain on approximately 01/14/2016. This is not her first kidney stone. Her first stone was approximately 01/04/1976. She has had 2 stones prior to getting this one. She is currently having flank pain, fever, and chills. She denies having back pain, groin pain, nausea, and vomiting.   She has had ureteroscopy for treatment of her stones in the past.   She had the onset yesterday morning of epigastric area burning. She then had left upper quadrant pain. The pain is severe. She has had no hematuria. She has no nausea or vomiting.      ALLERGIES: dexamethasone - Skin Rash, tachycardia    MEDICATIONS: Lisinopril 20 mg tablet  Metformin Hcl 500 mg tablet  Zyrtec  Maxzide-25 Mg 37.5 mg-25 mg tablet  Tiazac 360 mg capsule, extended release  Zetia 10 mg tablet     GU PSH: Hysterectomy    NON-GU PSH: None   GU PMH: Kidney Stone      PMH Notes: diabetes   NON-GU PMH: Anxiety disorder, unspecified Essential (primary) hypertension Pure hypercholesterolemia, unspecified    FAMILY HISTORY: brain aneurysm - Father Breast Cancer - Mother Kidney Stones - Son, Father   SOCIAL HISTORY: Marital Status: Married Patient has never smoked.  Drinks 1 drink per week.  Drinks 1 caffeinated drink per day. 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     Notes: 2 sons    REVIEW OF SYSTEMS:    GU Review Female:   Patient reports get up at night to urinate and leakage of urine. Patient denies stream starts and stops, currently pregnant, trouble starting your stream, frequent urination, burning /pain with urination, hard to postpone urination, and have to strain to urinate.  Gastrointestinal (Upper):   Patient denies nausea, vomiting, and indigestion/  heartburn.  Gastrointestinal (Lower):   Patient denies diarrhea and constipation.  Constitutional:   Patient denies fever, night sweats, weight loss, and fatigue.  Skin:   Patient denies skin rash/ lesion and itching.  Eyes:   Patient denies blurred vision and double vision.  Ears/ Nose/ Throat:   Patient denies sore throat and sinus problems.  Hematologic/Lymphatic:   Patient denies swollen glands and easy bruising.  Cardiovascular:   Patient denies leg swelling and chest pains.  Respiratory:   Patient denies cough and shortness of breath.  Endocrine:   Patient denies excessive thirst.  Musculoskeletal:   Patient reports back pain. Patient denies joint pain.  Neurological:   Patient denies headaches and dizziness.  Psychologic:   Patient denies depression and anxiety.   Notes: indigestion/heartburn   VITAL SIGNS:    Weight: 219 lb/99.3 kg   Height/Length: 62 in / 157 cm   BP: 126/85 mmHg   Heart Rate: 109 /min   Temp: 98.5 F / 37 C   BMI: 40.1      MULTI-SYSTEM PHYSICAL EXAMINATION:    Constitutional: Obese. No physical deformities. Normally developed. Good grooming. In obvious pain  Neck: Neck symmetrical, not swollen. Normal tracheal position.  Respiratory: No labored breathing, no use of accessory muscles.   Cardiovascular: Normal temperature, normal extremity pulses, no swelling, no varicosities.  Lymphatic: No enlargement, no tenderness neck lymph nodes.  Skin: No paleness, no jaundice, no cyanosis. No lesion, no ulcer, no  rash.  Neurologic / Psychiatric: Oriented to time, oriented to place, oriented to person. No depression, no anxiety, no agitation.  Gastrointestinal: Abdominal tenderness in the epigastric, RUQ and bilateral CVAT. with guarding and rebound. No masses or HSM are noted. No hernias are noted  Ears, Nose, Mouth, and Throat: Left ear no scars, no lesions, no masses. Right ear no scars, no lesions, no masses. Nose no scars, no lesions, no masses. Normal  hearing. Normal lips.  Musculoskeletal: Normal gait and station of head and neck.   PAST DATA REVIEWED:  Source Of History:  Patient  Lab Test Review:   CMP, CBC with Diff  Urine Test Review:   Urinalysis  X-Ray Review: C.T. Urogram: Reviewed Films. Reviewed Report.     PROCEDURES:          Urinalysis - 81003 Dipstick Dipstick Cont'd  Specimen: Voided Bilirubin: Neg  Color: Yellow Ketones: Neg  Appearance: Clear Blood: Neg  Specific Gravity: 1.020 Protein: Neg  pH: 7.0 Urobilinogen: 0.2  Glucose: Neg Nitrites: Neg    Leukocyte Esterase: Neg         Phenergan 25mg - 96372, J2550 Qty: 25 Adm. By: Whitney Walker  Unit: mg Lot No [id number]  Route: IM Exp. Date 07/05/2017  Freq: None Mfgr.:   Site: Right Buttock   ASSESSMENT:      ICD-10 Details  1 GU:   Kidney Stone - N20.0   2   Right upper quadrant pain - R10.11   3   Epigastric pain - R10.13    PLAN:           Schedule Procedure: Unspecified Date at [facility] - 29199 - Morphine 4mg (Morphine Per 10 Mg) - 96372, J2270  Procedure: Unspecified Date at [facility] - 29199 - Phenergan 25mg (Phenergan Per 50 Mg) - J2550, 96372          Document Letter(s):  Created for Patient: Clinical Summary         Notes:   She has epigastric and right upper quadrant pain with bilateral CVAT right > left but her stone was non-obstructing on CT and her UA's have been clear. I am concerned that she may have pancreatitis or cholecystitis as a cause of her pain.   I am going to admit her for pain control and further evaluation with abdominal US, additional labs and a Hospitalist consult.    Addendum:   She has recovered from her cholecystectomy and is now to undergo right ureteroscopy for her stone.   She was seen in the office recent but the office H&P didn't copy over satisfactorally.           Admission (Discharged) on 01/15/2016        Routing History          Detailed Report      

## 2016-03-21 NOTE — Anesthesia Postprocedure Evaluation (Signed)
Anesthesia Post Note  Patient: Kathy Bellowatricia M Cavness  Procedure(s) Performed: Procedure(s) (LRB): CYSTOSCOPY WITH RIGHT RETROGRADE PYELOGRAM, RIGHT URETEROSCOPY WITH LASER  AND STENT PLACEMENT (Right) HOLMIUM LASER APPLICATION (N/A)  Patient location during evaluation: PACU Anesthesia Type: General Level of consciousness: awake and alert Pain management: pain level controlled Vital Signs Assessment: post-procedure vital signs reviewed and stable Respiratory status: spontaneous breathing, nonlabored ventilation, respiratory function stable and patient connected to nasal cannula oxygen Cardiovascular status: blood pressure returned to baseline and stable Postop Assessment: no signs of nausea or vomiting Anesthetic complications: no    Last Vitals:  Vitals:   03/21/16 1805 03/21/16 1815  BP: (!) 144/79 (!) 142/73  Pulse: (!) 56 (!) 58  Resp: 12 13  Temp: 36.5 C     Last Pain:  Vitals:   03/21/16 1815  PainSc: 4                  Sebastian Acheheodore Edyth Glomb

## 2016-03-22 ENCOUNTER — Encounter (HOSPITAL_COMMUNITY): Payer: Self-pay | Admitting: Urology

## 2016-03-22 NOTE — Op Note (Signed)
NAMJonathon Resides:  [place], [place]             ACCOUNT NO.:  [id number]  MEDICAL RECORD NO.:  [id number]  LOCATION:  [facility]                         [facility]  PHYSICIAN:  Excell SeltzerJohn J. Annabell HowellsWrenn, M.D.    DATE OF BIRTH:  07/30/1956  DATE OF PROCEDURE:  03/21/2016 DATE OF DISCHARGE:  03/21/2016                              OPERATIVE REPORT   PROCEDURES: 1. Cystoscopy with removal of right double-J stent. 2. Right ureteroscopic stone extraction with holmium laser lithotripsy     and insertion of right double-J stent.  PREOPERATIVE DIAGNOSIS:  Right renal pelvic stone.  POSTOPERATIVE DIAGNOSIS:  Right renal pelvic stone.  SURGEON:  Excell SeltzerJohn J. Annabell HowellsWrenn, M.D.  ANESTHESIA:  General.  SPECIMEN:  Stone fragments.  DRAINS:  A 6-French 24 cm right double-J stent with tether.  ESTIMATED BLOOD LOSS:  None.  COMPLICATIONS:  None.  INDICATIONS:  Ms. Terie PurserDooley is a 60 year old, white female who was found to have approximately 1 cm stone on CT scanning during evaluation for right upper quadrant pain.  She was eventually found to have a hot gallbladder and required urgent cholecystectomy, but returned following that procedure for an attempted ureteroscopy.  Her ureter was too narrow to proceed, so stent was placed approximately a month ago.  She returns now for definitive stone management.  FINDINGS OF PROCEDURE:  She was taken to the operating room where she was given 2 g of Ancef.  She was placed in lithotomy position and was fitted with PAS hose.  Her perineum and genitalia were prepped with Betadine solution and she was draped in usual sterile fashion. Cystoscopy was performed using the 22-French and 30-degree lens. Examination revealed the stent at the right ureteral orifice.  The previous endoscopic inspection was reported.  At the last cystoscopy, the only change was edema of the right orifice.  The stent was grasped with a grasping forceps and pulled the urethral meatus.  A guidewire was then passed to  the kidney and the stent was removed.  A 12-French 38 cm digital access sheath inner core was then passed over the wire to the kidney without difficulty.  The sheath was assembled and reinserted to just below the kidney over the wire.  The wire and inner core were removed and a dual-lumen digital flexible ureteroscope was then passed through the sheath to the kidney.  The stone was readily visualized.  It was engaged with a 200 micron laser fiber, set on 1 watt and 10 hertz and was broken into manageable fragments.  The fragments were then removed with additional lasering as needed using an NGage basket.  Final inspection demonstrated no significant stone fragments and only dust and at most 1 mm fragments remained that were too small to grasp.  At this point, a guidewire was replaced to the kidney and the access sheath was removed.  The cystoscope was reinserted over the wire and a 6- French 24 cm double-J stent with tether was passed to the kidney under fluoroscopic guidance.  The wire was removed, leaving good coil in the kidney and good coil in the bladder.  The bladder was then drained.  The cystoscope was removed, leaving stent string exiting urethra.  The stent string  was tied close to the meatus, trimmed to an appropriate length, and then tucked vaginally.  Of note, during the procedure, during 1 pass of the ureteroscope, the laser fiber appears the ureteral wall, however, there was no significant bleeding and it was felt that short-term stenting should be appropriate management for this.  The patient was taken down from lithotomy position and her anesthetic was reversed.  She was moved to recovery in stable condition.  There were no other complications.     Excell SeltzerJohn J. Annabell HowellsWrenn, M.D.     JJW/MEDQ  D:  03/21/2016  T:  03/22/2016  Job:  [id number]

## 2016-04-03 DIAGNOSIS — Z87442 Personal history of urinary calculi: Secondary | ICD-10-CM | POA: Diagnosis not present

## 2016-04-30 DIAGNOSIS — E119 Type 2 diabetes mellitus without complications: Secondary | ICD-10-CM | POA: Diagnosis not present

## 2016-04-30 DIAGNOSIS — Z23 Encounter for immunization: Secondary | ICD-10-CM | POA: Diagnosis not present

## 2016-04-30 DIAGNOSIS — Z1231 Encounter for screening mammogram for malignant neoplasm of breast: Secondary | ICD-10-CM | POA: Diagnosis not present

## 2016-05-02 DIAGNOSIS — I10 Essential (primary) hypertension: Secondary | ICD-10-CM | POA: Diagnosis not present

## 2016-05-02 DIAGNOSIS — E119 Type 2 diabetes mellitus without complications: Secondary | ICD-10-CM | POA: Diagnosis not present

## 2016-06-11 DIAGNOSIS — L918 Other hypertrophic disorders of the skin: Secondary | ICD-10-CM | POA: Diagnosis not present

## 2016-06-21 DIAGNOSIS — H47093 Other disorders of optic nerve, not elsewhere classified, bilateral: Secondary | ICD-10-CM | POA: Diagnosis not present

## 2016-06-21 DIAGNOSIS — H2513 Age-related nuclear cataract, bilateral: Secondary | ICD-10-CM | POA: Diagnosis not present

## 2016-06-21 DIAGNOSIS — H35431 Paving stone degeneration of retina, right eye: Secondary | ICD-10-CM | POA: Diagnosis not present

## 2016-06-21 DIAGNOSIS — H40053 Ocular hypertension, bilateral: Secondary | ICD-10-CM | POA: Diagnosis not present

## 2016-07-31 DIAGNOSIS — H2511 Age-related nuclear cataract, right eye: Secondary | ICD-10-CM | POA: Diagnosis not present

## 2016-09-05 DIAGNOSIS — H2512 Age-related nuclear cataract, left eye: Secondary | ICD-10-CM | POA: Diagnosis not present

## 2016-09-10 DIAGNOSIS — H2512 Age-related nuclear cataract, left eye: Secondary | ICD-10-CM | POA: Diagnosis not present

## 2016-09-10 DIAGNOSIS — H269 Unspecified cataract: Secondary | ICD-10-CM | POA: Diagnosis not present

## 2016-10-16 DIAGNOSIS — H524 Presbyopia: Secondary | ICD-10-CM | POA: Diagnosis not present

## 2016-10-16 DIAGNOSIS — H5203 Hypermetropia, bilateral: Secondary | ICD-10-CM | POA: Diagnosis not present

## 2016-10-16 DIAGNOSIS — H52203 Unspecified astigmatism, bilateral: Secondary | ICD-10-CM | POA: Diagnosis not present

## 2016-10-28 DIAGNOSIS — J029 Acute pharyngitis, unspecified: Secondary | ICD-10-CM | POA: Diagnosis not present

## 2016-12-19 DIAGNOSIS — E119 Type 2 diabetes mellitus without complications: Secondary | ICD-10-CM | POA: Diagnosis not present

## 2016-12-25 DIAGNOSIS — E119 Type 2 diabetes mellitus without complications: Secondary | ICD-10-CM | POA: Diagnosis not present

## 2016-12-25 DIAGNOSIS — I10 Essential (primary) hypertension: Secondary | ICD-10-CM | POA: Diagnosis not present

## 2017-01-28 DIAGNOSIS — Z0000 Encounter for general adult medical examination without abnormal findings: Secondary | ICD-10-CM | POA: Diagnosis not present

## 2017-01-28 LAB — BASIC METABOLIC PANEL
BUN: 24 — AB (ref 4–21)
Creatinine: 1.1 (ref 0.5–1.1)
Glucose: 112
Potassium: 4.1 (ref 3.4–5.3)
Sodium: 137 (ref 137–147)

## 2017-01-28 LAB — HEPATIC FUNCTION PANEL
ALT: 16 (ref 7–35)
AST: 12 — AB (ref 13–35)
Alkaline Phosphatase: 70 (ref 25–125)

## 2017-01-28 LAB — CBC AND DIFFERENTIAL
HCT: 39 (ref 36–46)
Hemoglobin: 13.2 (ref 12.0–16.0)
Platelets: 353 (ref 150–399)
WBC: 9

## 2017-01-28 LAB — TSH: TSH: 2.02 (ref 0.41–5.90)

## 2017-01-28 LAB — LIPID PANEL
Cholesterol: 208 — AB (ref 0–200)
HDL: 38 (ref 35–70)
LDL Cholesterol: 170
Triglycerides: 202 — AB (ref 40–160)

## 2017-01-30 DIAGNOSIS — Z1159 Encounter for screening for other viral diseases: Secondary | ICD-10-CM | POA: Diagnosis not present

## 2017-01-30 DIAGNOSIS — Z0110 Encounter for examination of ears and hearing without abnormal findings: Secondary | ICD-10-CM | POA: Diagnosis not present

## 2017-01-30 DIAGNOSIS — Z0000 Encounter for general adult medical examination without abnormal findings: Secondary | ICD-10-CM | POA: Diagnosis not present

## 2017-01-30 DIAGNOSIS — Z0100 Encounter for examination of eyes and vision without abnormal findings: Secondary | ICD-10-CM | POA: Diagnosis not present

## 2017-04-29 DIAGNOSIS — H01001 Unspecified blepharitis right upper eyelid: Secondary | ICD-10-CM | POA: Diagnosis not present

## 2017-04-29 DIAGNOSIS — H01004 Unspecified blepharitis left upper eyelid: Secondary | ICD-10-CM | POA: Diagnosis not present

## 2017-04-29 DIAGNOSIS — H01002 Unspecified blepharitis right lower eyelid: Secondary | ICD-10-CM | POA: Diagnosis not present

## 2017-04-29 DIAGNOSIS — H01005 Unspecified blepharitis left lower eyelid: Secondary | ICD-10-CM | POA: Diagnosis not present

## 2017-05-13 DIAGNOSIS — Z23 Encounter for immunization: Secondary | ICD-10-CM | POA: Diagnosis not present

## 2017-05-23 DIAGNOSIS — Z1231 Encounter for screening mammogram for malignant neoplasm of breast: Secondary | ICD-10-CM | POA: Diagnosis not present

## 2017-05-24 DIAGNOSIS — E119 Type 2 diabetes mellitus without complications: Secondary | ICD-10-CM | POA: Diagnosis not present

## 2017-05-24 DIAGNOSIS — Z1159 Encounter for screening for other viral diseases: Secondary | ICD-10-CM | POA: Diagnosis not present

## 2017-06-17 DIAGNOSIS — M79674 Pain in right toe(s): Secondary | ICD-10-CM | POA: Diagnosis not present

## 2017-10-09 ENCOUNTER — Ambulatory Visit: Payer: BLUE CROSS/BLUE SHIELD | Admitting: Family Medicine

## 2017-10-09 ENCOUNTER — Telehealth: Payer: Self-pay

## 2017-10-09 ENCOUNTER — Ambulatory Visit (INDEPENDENT_AMBULATORY_CARE_PROVIDER_SITE_OTHER): Payer: BLUE CROSS/BLUE SHIELD

## 2017-10-09 ENCOUNTER — Encounter: Payer: Self-pay | Admitting: Family Medicine

## 2017-10-09 VITALS — BP 130/80 | HR 95 | Temp 98.0°F | Ht 62.0 in | Wt 217.2 lb

## 2017-10-09 DIAGNOSIS — M25561 Pain in right knee: Secondary | ICD-10-CM | POA: Diagnosis not present

## 2017-10-09 DIAGNOSIS — M1711 Unilateral primary osteoarthritis, right knee: Secondary | ICD-10-CM | POA: Insufficient documentation

## 2017-10-09 DIAGNOSIS — M179 Osteoarthritis of knee, unspecified: Secondary | ICD-10-CM | POA: Insufficient documentation

## 2017-10-09 DIAGNOSIS — E782 Mixed hyperlipidemia: Secondary | ICD-10-CM

## 2017-10-09 DIAGNOSIS — I10 Essential (primary) hypertension: Secondary | ICD-10-CM

## 2017-10-09 DIAGNOSIS — E119 Type 2 diabetes mellitus without complications: Secondary | ICD-10-CM | POA: Diagnosis not present

## 2017-10-09 DIAGNOSIS — G8929 Other chronic pain: Secondary | ICD-10-CM | POA: Diagnosis not present

## 2017-10-09 DIAGNOSIS — M1710 Unilateral primary osteoarthritis, unspecified knee: Secondary | ICD-10-CM | POA: Insufficient documentation

## 2017-10-09 DIAGNOSIS — M10072 Idiopathic gout, left ankle and foot: Secondary | ICD-10-CM

## 2017-10-09 DIAGNOSIS — M109 Gout, unspecified: Secondary | ICD-10-CM

## 2017-10-09 HISTORY — DX: Osteoarthritis of knee, unspecified: M17.9

## 2017-10-09 HISTORY — DX: Idiopathic gout, left ankle and foot: M10.072

## 2017-10-09 HISTORY — DX: Unilateral primary osteoarthritis, unspecified knee: M17.10

## 2017-10-09 MED ORDER — DICLOFENAC SODIUM 1.5 % TD SOLN: TRANSDERMAL | 1 refills | Status: DC

## 2017-10-09 MED ORDER — INDOMETHACIN 50 MG PO CAPS: 50.0000 mg | ORAL_CAPSULE | Freq: Three times a day (TID) | ORAL | 0 refills | Status: DC

## 2017-10-09 NOTE — Telephone Encounter (Signed)
Insurance won't cover the Diclofenac Sodium 1.5% solution. Is there a different one we can send in?    Copied from CRM [id number]. Topic: General - Other >> Oct 09, 2017  3:38 PM Gerrianne ScalePayne, Angela L wrote: Reason for CRM: patient calling stating that her insurance want cover the RX Diclofenac Sodium 1.5 % SOLN she would like to have something else

## 2017-10-09 NOTE — Progress Notes (Signed)
Subjective:  Patient ID: Kathy [name] Aho, female    DOB: 1956/03/30  Age: 62 y.o. MRN: [id number]  CC: Establish Care   HPI Kathy [name] Crady presents for establishment of care.  She tells me that she is been having problems with pain in her right great toe area of the last few weeks.  There is been red and swollen.  She she had a similar occurrence back in October.  She has no formal history of gout.  He has a past medical history of hypertension and diabetes.  These have both been well controlled.  She has been intolerant of statins.  She has tried multiple statins.  She has a history of chronic right knee pain.  Denies specific instability.  She thinks she may have sustained an injury to his back in high school.  She rarely uses Xanax for sleep issues.  She is status post TAH secondary to uterine fibroids and endometriosis.  History Elease Hashimotoatricia has a past medical history of Anxiety, Arthritis, H/O seasonal allergies, History of kidney stones, Hyperlipidemia, Hypertension, Pneumonia, Renal calculus, right, Type 2 diabetes mellitus (HCC), and Wears contact lenses.   She has a past surgical history that includes Cholecystectomy (N/A, 01/16/2016); Total abdominal hysterectomy w/ bilateral salpingoophorectomy (08/1996); Cystoscopy with retrograde pyelogram, ureteroscopy and stent placement (Right, 03/07/2016); Cystoscopy with retrograde pyelogram, ureteroscopy and stent placement (Right, 03/21/2016); and Holmium laser application (N/A, 03/21/2016).   Her family history includes Aneurysm in her father; Breast cancer in her mother; Kidney Stones in her father.She reports that  has never smoked. she has never used smokeless tobacco. She reports that she drinks alcohol. She reports that she does not use drugs.  Outpatient Medications Prior to Visit  Medication Sig Dispense Refill  . ALPRAZolam (XANAX) 0.5 MG tablet Take 0.5 mg by mouth at bedtime as needed for anxiety.     . cetirizine (ZYRTEC) 10 MG tablet  Take 10 mg by mouth every morning.    . diltiazem (TIAZAC) 360 MG 24 hr capsule Take 360 mg by mouth every morning.    Marland Kitchen. lisinopril (PRINIVIL,ZESTRIL) 20 MG tablet Take 20 mg by mouth daily.  11  . metFORMIN (GLUCOPHAGE-XR) 500 MG 24 hr tablet Take 1,000 mg by mouth daily with breakfast.   11  . triamterene-hydrochlorothiazide (MAXZIDE-25) 37.5-25 MG tablet Take 1 tablet by mouth daily.  11  . ibuprofen (ADVIL,MOTRIN) 200 MG tablet Take 600 mg by mouth every 6 (six) hours as needed for headache or moderate pain.    Marland Kitchen. oxyCODONE-acetaminophen (ROXICET) 5-325 MG tablet Take 1 tablet by mouth every 6 (six) hours as needed for severe pain. 20 tablet 0  . phenazopyridine (PYRIDIUM) 200 MG tablet Take 1 tablet (200 mg total) by mouth 3 (three) times daily as needed for pain. 15 tablet 1   No facility-administered medications prior to visit.     ROS Review of Systems  Constitutional: Negative.   HENT: Negative.   Eyes: Negative.   Respiratory: Negative.   Cardiovascular: Negative.   Gastrointestinal: Negative.   Endocrine: Negative for polyphagia and polyuria.  Genitourinary: Negative.   Musculoskeletal: Positive for arthralgias and gait problem. Negative for myalgias.  Skin: Positive for color change. Negative for rash and wound.  Allergic/Immunologic: Positive for immunocompromised state.  Hematological: Negative.   Psychiatric/Behavioral: Negative.     Objective:  BP 130/80 (BP Location: Right Arm, Patient Position: Sitting, Cuff Size: Normal)   Pulse 95   Temp 98 F (36.7 C) (Oral)   Ht 5'  2" (1.575 [name])   Wt 217 lb 4 oz (98.5 kg)   SpO2 96%   BMI 39.74 kg/[name]   Physical Exam  Constitutional: She is oriented to person, place, and time. She appears well-developed and well-nourished. No distress.  HENT:  Head: Normocephalic and atraumatic.  Right Ear: External ear normal.  Left Ear: External ear normal.  Mouth/Throat: No oropharyngeal exudate.  Eyes: Right eye exhibits no  discharge. Left eye exhibits no discharge. No scleral icterus.  Neck: Neck supple. No JVD present. No tracheal deviation present.  Cardiovascular:  Pulses:      Dorsalis pedis pulses are 2+ on the right side, and 2+ on the left side.       Posterior tibial pulses are 2+ on the right side, and 2+ on the left side.  Pulmonary/Chest: Effort normal. No stridor.  Musculoskeletal:       Left foot: There is tenderness, bony tenderness and swelling. There is normal range of motion, normal capillary refill and no deformity.       Feet:  Neurological: She is alert and oriented to person, place, and time.  Skin: Skin is warm and dry. She is not diaphoretic.  Psychiatric: She has a normal mood and affect. Her behavior is normal.  She had a similar occurrence back in October.  Diabetic Foot Exam - Simple   Simple Foot Form  Visual Inspection  No deformities, no ulcerations, no other skin breakdown bilaterally: Yes  Sensation Testing  Intact to touch and monofilament testing bilaterally: Yes  Pulse Check  Posterior Tibialis and Dorsalis pulse intact bilaterally: Yes  Comments feet are pes planus and dry. Assessment & Plan:   Brinlyn was seen today for establish care.  Diagnoses and all orders for this visit:  Acute idiopathic gout involving toe of left foot -     Diclofenac Sodium 1.5 % SOLN; Massage 4-8 drops of solution onto affected joint 4 times daily as needed. .  Chronic pain of right knee -     DG Knee Complete 4 Views Right; Future -     DG Knee Complete 4 Views Right -     Diclofenac Sodium 1.5 % SOLN; Massage 4-8 drops of solution onto affected joint 4 times daily as needed. .  Diabetes mellitus without complication (HCC)  Essential hypertension  Mixed hyperlipidemia   I have discontinued Lorelle Formosa. Nicoll's ibuprofen, phenazopyridine, and oxyCODONE-acetaminophen. I am also having her start on Diclofenac Sodium. Additionally, I am having her maintain her ALPRAZolam,  cetirizine, diltiazem, lisinopril, triamterene-hydrochlorothiazide, and metFORMIN.  Meds ordered this encounter  Medications  . Diclofenac Sodium 1.5 % SOLN    Sig: Massage 4-8 drops of solution onto affected joint 4 times daily as needed. .    Dispense:  150 mL    Refill:  1     Follow-up: Return in about 3 months (around 01/09/2018), or if symptoms worsen or fail to improve.  [name], MD

## 2017-10-10 ENCOUNTER — Encounter: Payer: Self-pay | Admitting: Family Medicine

## 2017-10-20 ENCOUNTER — Encounter: Payer: Self-pay | Admitting: Family Medicine

## 2017-11-06 DIAGNOSIS — J040 Acute laryngitis: Secondary | ICD-10-CM | POA: Diagnosis not present

## 2017-11-06 DIAGNOSIS — R05 Cough: Secondary | ICD-10-CM | POA: Diagnosis not present

## 2017-11-06 DIAGNOSIS — J069 Acute upper respiratory infection, unspecified: Secondary | ICD-10-CM | POA: Diagnosis not present

## 2017-11-06 DIAGNOSIS — J029 Acute pharyngitis, unspecified: Secondary | ICD-10-CM | POA: Diagnosis not present

## 2017-12-09 ENCOUNTER — Encounter: Payer: Self-pay | Admitting: Family Medicine

## 2017-12-09 ENCOUNTER — Ambulatory Visit: Payer: BLUE CROSS/BLUE SHIELD | Admitting: Family Medicine

## 2017-12-09 VITALS — BP 134/80 | HR 91 | Ht 62.0 in | Wt 217.1 lb

## 2017-12-09 DIAGNOSIS — I10 Essential (primary) hypertension: Secondary | ICD-10-CM | POA: Diagnosis not present

## 2017-12-09 DIAGNOSIS — E782 Mixed hyperlipidemia: Secondary | ICD-10-CM | POA: Diagnosis not present

## 2017-12-09 DIAGNOSIS — E790 Hyperuricemia without signs of inflammatory arthritis and tophaceous disease: Secondary | ICD-10-CM | POA: Diagnosis not present

## 2017-12-09 DIAGNOSIS — Z0001 Encounter for general adult medical examination with abnormal findings: Secondary | ICD-10-CM

## 2017-12-09 DIAGNOSIS — E119 Type 2 diabetes mellitus without complications: Secondary | ICD-10-CM | POA: Diagnosis not present

## 2017-12-09 HISTORY — DX: Hyperuricemia without signs of inflammatory arthritis and tophaceous disease: E79.0

## 2017-12-09 HISTORY — DX: Morbid (severe) obesity due to excess calories: E66.01

## 2017-12-09 MED ORDER — COLESEVELAM HCL 625 MG PO TABS: 1875.0000 mg | ORAL_TABLET | Freq: Two times a day (BID) | ORAL | 1 refills | Status: DC

## 2017-12-09 MED ORDER — TRIAMTERENE-HCTZ 37.5-25 MG PO TABS: 1.0000 | ORAL_TABLET | Freq: Every day | ORAL | 1 refills | Status: DC

## 2017-12-09 MED ORDER — LISINOPRIL 20 MG PO TABS: 20.0000 mg | ORAL_TABLET | Freq: Every day | ORAL | 1 refills | Status: DC

## 2017-12-09 MED ORDER — METFORMIN HCL ER 500 MG PO TB24: 1000.0000 mg | ORAL_TABLET | Freq: Every day | ORAL | 1 refills | Status: DC

## 2017-12-09 MED ORDER — DILTIAZEM HCL ER BEADS 360 MG PO CP24: 360.0000 mg | ORAL_CAPSULE | Freq: Every morning | ORAL | 1 refills | Status: DC

## 2017-12-09 NOTE — Progress Notes (Signed)
Subjective:  Patient ID: Kathy [name], female    DOB: [birth year]  Age: 61 y.o. MRN: [id number]  CC: Follow-up   HPI Kathy [name] presents for a physical exam and follow-up of her multiple medical issues.  Her blood pressure is been well controlled on 3 drug regimen to include diltiazem lisinopril and Maxide.  She is having no issues with any of these medicines.  For her diabetes is taking thousand milligrams of Glucophage every morning.  She has a dilated eye exam yearly.  She has a history of a dangerously elevated LDL cholesterol.  Last level noted in the chart was 170.  She has not tolerated Lipitor, Pravachol, Crestor and Zocor.  All of these seem to cause weakness in her legs that promptly resolved upon their discontinuation.  She has taken Zetia without incidents in the past.  She has no known history of heart attack or stroke.  She does not smoke.  She rarely drinks alcohol.  She does not use illicit drugs.  She lives with her husband.  Her father died in his 48s of a cerebral aneurysm.  Her mom died at age 100 from complications associated with breast cancer.  Patient has had a total abdominal hysterectomy in her 74s.  Her last mammogram was less than a year ago.  Last colonoscopy was at age 75 and she was advised to return in 10 years.  She does have a history of gout attacks with an elevated uric acid.  To her knowledge she is only had 2 attacks today.  She does not eat red meat that often but she does enjoys shellfish.  She rarely if ever uses Xanax at night.  Her prescription is 64 years old.  Outpatient Medications Prior to Visit  Medication Sig Dispense Refill  . ALPRAZolam (XANAX) 0.5 MG tablet Take 0.5 mg by mouth at bedtime as needed for anxiety.     Marland Kitchen levocetirizine (XYZAL) 5 MG tablet Take 5 mg by mouth every evening.    . cetirizine (ZYRTEC) 10 MG tablet Take 10 mg by mouth every morning.    . diltiazem (TIAZAC) 360 MG 24 hr capsule Take 360 mg by mouth every morning.      . indomethacin (INDOCIN) 50 MG capsule Take 1 capsule (50 mg total) by mouth 3 (three) times daily with meals. As needed for joint pains. 60 capsule 0  . lisinopril (PRINIVIL,ZESTRIL) 20 MG tablet Take 20 mg by mouth daily.  11  . metFORMIN (GLUCOPHAGE-XR) 500 MG 24 hr tablet Take 1,000 mg by mouth daily with breakfast.   11  . triamterene-hydrochlorothiazide (MAXZIDE-25) 37.5-25 MG tablet Take 1 tablet by mouth daily.  11   No facility-administered medications prior to visit.     ROS Review of Systems  Constitutional: Negative.   HENT: Negative.   Eyes: Negative.   Respiratory: Negative.   Cardiovascular: Negative.   Gastrointestinal: Negative.   Endocrine: Negative for polyphagia and polyuria.  Genitourinary: Negative for difficulty urinating, hematuria and urgency.  Musculoskeletal: Negative for gait problem and joint swelling.  Skin: Negative for pallor and rash.  Allergic/Immunologic: Negative for immunocompromised state.  Neurological: Negative for weakness and headaches.  Hematological: Does not bruise/bleed easily.  Psychiatric/Behavioral: Negative.     Objective:  BP 134/80   Pulse 91   Ht  (1.575 m)   Wt 217 lb 2 oz (98.5 kg)   SpO2 96%   BMI 39.71 kg/m   BP Readings from Last 3 Encounters:  12/09/17 134/80  10/09/17 130/80  03/21/16 (!) 152/71    Wt Readings from Last 3 Encounters:  12/09/17 217 lb 2 oz (98.5 kg)  10/09/17 217 lb 4 oz (98.5 kg)  03/21/16 212 lb (96.2 kg)    Physical Exam  Constitutional: She is oriented to person, place, and time. She appears well-developed and well-nourished.  HENT:  Head: Normocephalic and atraumatic.  Right Ear: External ear normal.  Left Ear: External ear normal.  Nose: Nose normal.  Mouth/Throat: Oropharynx is clear and moist. No oropharyngeal exudate.  Eyes: Pupils are equal, round, and reactive to light. Conjunctivae and EOM are normal. Right eye exhibits no discharge. Left eye exhibits no discharge. No  scleral icterus.  Neck: Normal range of motion. Neck supple. No JVD present. No tracheal deviation present. No thyromegaly present.  Cardiovascular: Normal rate, regular rhythm and normal heart sounds.  Pulmonary/Chest: Effort normal and breath sounds normal. No stridor. No respiratory distress. She has no wheezes. She has no rales.  Abdominal: Bowel sounds are normal.  Musculoskeletal: She exhibits no edema or deformity.  Lymphadenopathy:    She has no cervical adenopathy.  Neurological: She is alert and oriented to person, place, and time.  Skin: Skin is warm and dry. Capillary refill takes less than 2 seconds. No erythema. No pallor.  Psychiatric: She has a normal mood and affect. Her behavior is normal.    Lab Results  Component Value Date   WBC 9.0 01/28/2017   HGB 13.2 01/28/2017   HCT 39 01/28/2017   PLT 353 01/28/2017   GLUCOSE 104 (H) 03/18/2016   CHOL 208 (A) 01/28/2017   TRIG 202 (A) 01/28/2017   HDL 38 01/28/2017   LDLCALC 170 01/28/2017   ALT 16 01/28/2017   AST 12 (A) 01/28/2017   NA 137 01/28/2017   K 4.1 01/28/2017   CL 104 03/18/2016   CREATININE 1.1 01/28/2017   BUN 24 (A) 01/28/2017   CO2 25 03/18/2016   TSH 2.02 01/28/2017   INR 1.26 01/16/2016   HGBA1C 6.2 (H) 03/18/2016    No results found.  Assessment & Plan:   Kathy [name] was seen today for follow-up.  Diagnoses and all orders for this visit:  Essential hypertension -     diltiazem (TIAZAC) 360 MG 24 hr capsule; Take 1 capsule (360 mg total) by mouth every morning. -     lisinopril (PRINIVIL,ZESTRIL) 20 MG tablet; Take 1 tablet (20 mg total) by mouth daily. -     triamterene-hydrochlorothiazide (MAXZIDE-25) 37.5-25 MG tablet; Take 1 tablet by mouth daily. -     CBC -     Comprehensive metabolic panel -     Urinalysis, Routine w reflex microscopic  Diabetes mellitus without complication (HCC) -     lisinopril (PRINIVIL,ZESTRIL) 20 MG tablet; Take 1 tablet (20 mg total) by mouth daily. -      metFORMIN (GLUCOPHAGE-XR) 500 MG 24 hr tablet; Take 2 tablets (1,000 mg total) by mouth at bedtime. -     CBC -     Comprehensive metabolic panel -     Hemoglobin A1c -     Urinalysis, Routine w reflex microscopic -     Microalbumin / creatinine urine ratio  Mixed hyperlipidemia -     colesevelam (WELCHOL) 625 MG tablet; Take 3 tablets (1,875 mg total) by mouth 2 (two) times daily with a meal. -     Comprehensive metabolic panel -     Lipid panel  Elevated uric acid in  blood -     Uric acid  Morbid obesity (HCC)  Encounter for health maintenance examination with abnormal findings -     CBC -     Comprehensive metabolic panel -     VITAMIN D 25 Hydroxy (Vit-D Deficiency, Fractures)   I have discontinued Lorelle Formosa. Canoy's cetirizine and indomethacin. I have also changed her diltiazem, lisinopril, and metFORMIN. Additionally, I am having her start on colesevelam. Lastly, I am having her maintain her ALPRAZolam, levocetirizine, and triamterene-hydrochlorothiazide.  Meds ordered this encounter  Medications  . diltiazem (TIAZAC) 360 MG 24 hr capsule    Sig: Take 1 capsule (360 mg total) by mouth every morning.    Dispense:  90 capsule    Refill:  1  . lisinopril (PRINIVIL,ZESTRIL) 20 MG tablet    Sig: Take 1 tablet (20 mg total) by mouth daily.    Dispense:  90 tablet    Refill:  1  . triamterene-hydrochlorothiazide (MAXZIDE-25) 37.5-25 MG tablet    Sig: Take 1 tablet by mouth daily.    Dispense:  90 tablet    Refill:  1  . metFORMIN (GLUCOPHAGE-XR) 500 MG 24 hr tablet    Sig: Take 2 tablets (1,000 mg total) by mouth at bedtime.    Dispense:  180 tablet    Refill:  1  . colesevelam (WELCHOL) 625 MG tablet    Sig: Take 3 tablets (1,875 mg total) by mouth 2 (two) times daily with a meal.    Dispense:  540 tablet    Refill:  1   We had a 20-minute discussion the relationship of her health issues to her weight.  Her cholesterol is high has been difficult to treat due to  statin intolerance.  We will try WelChol.  She was given information on how to increase her exercise and hopefully lose some weight.  She knows that a 10 to 15% weight loss can make a world of difference in the control of her above disease states.  We did discuss the fact that multiple gouty attacks can damage to the joints over time.  Told her that with another gout attack within the next year we should discuss using a uric acid lowering agent.  She remains up-to-date on her health maintenance.  Anticipatory guidance was given to the patient on health maintenance.  Follow-up: Return in about 6 months (around 06/11/2018).  [name], MD

## 2017-12-09 NOTE — Patient Instructions (Addendum)
Cholesterol Cholesterol is a white, waxy, fat-like substance that is needed by the human body in small amounts. The liver makes all the cholesterol we need. Cholesterol is carried from the liver by the blood through the blood vessels. Deposits of cholesterol (plaques) may build up on blood vessel (artery) walls. Plaques make the arteries narrower and stiffer. Cholesterol plaques increase the risk for heart attack and stroke. You cannot feel your cholesterol level even if it is very high. The only way to know that it is high is to have a blood test. Once you know your cholesterol levels, you should keep a record of the test results. Work with your health care provider to keep your levels in the desired range. What do the results mean?  Total cholesterol is a rough measure of all the cholesterol in your blood.  LDL (low-density lipoprotein) is the "bad" cholesterol. This is the type that causes plaque to build up on the artery walls. You want this level to be low.  HDL (high-density lipoprotein) is the "good" cholesterol because it cleans the arteries and carries the LDL away. You want this level to be high.  Triglycerides are fat that the body can either burn for energy or store. High levels are closely linked to heart disease. What are the desired levels of cholesterol?  Total cholesterol below 200.  LDL below 100 for people who are at risk, below 70 for people at very high risk.  HDL above 40 is good. A level of 60 or higher is considered to be protective against heart disease.  Triglycerides below 150. How can I lower my cholesterol? Diet Follow your diet program as told by your health care provider.  Choose fish or white meat chicken and [place], roasted or baked. Limit fatty cuts of red meat, fried foods, and processed meats, such as sausage and lunch meats.  Eat lots of fresh fruits and vegetables.  Choose whole grains, beans, pasta, potatoes, and cereals.  Choose olive oil, corn  oil, or canola oil, and use only small amounts.  Avoid butter, mayonnaise, shortening, or palm kernel oils.  Avoid foods with trans fats.  Drink skim or nonfat milk and eat low-fat or nonfat yogurt and cheeses. Avoid whole milk, cream, ice cream, egg yolks, and full-fat cheeses.  Healthier desserts include angel food cake, ginger snaps, animal crackers, hard candy, popsicles, and low-fat or nonfat frozen yogurt. Avoid pastries, cakes, pies, and cookies.  Exercise  Follow your exercise program as told by your health care provider. A regular program: ? Helps to decrease LDL and raise HDL. ? Helps with weight control.  Do things that increase your activity level, such as gardening, walking, and taking the stairs.  Ask your health care provider about ways that you can be more active in your daily life.  Medicine  Take over-the-counter and prescription medicines only as told by your health care provider. ? Medicine may be prescribed by your health care provider to help lower cholesterol and decrease the risk for heart disease. This is usually done if diet and exercise have failed to bring down cholesterol levels. ? If you have several risk factors, you may need medicine even if your levels are normal.  This information is not intended to replace advice given to you by your health care provider. Make sure you discuss any questions you have with your health care provider. Document Released: 04/16/2001 Document Revised: 02/17/2016 Document Reviewed: 01/20/2016 Elsevier Interactive Patient Education  2018 [employer].  Complementary  and Alternative Medical Therapies for Diabetes Complementary and alternative medical therapies are treatments that are different from typical medical treatments (Western treatments). "Complementary" means that the therapy is used with Western treatments. "Alternative" means that the therapy is used instead of Western treatments. Are these therapies safe? Some of  these therapies are usually safe. Others may be harmful. Often, there is not enough research to show how safe or effective a therapy is. If you want to try a complementary or alternative therapy, talk with your health care provider to make sure it is safe. What alternative or complementary therapies are used to treat diabetes? Acupuncture Acupuncture is the insertion of needles into certain places on the skin. This is done by a professional. It is often used to relieve long-term (chronic) pain, especially of the bones and joints. It may help you if you have painful nerve damage. Biofeedback Biofeedback helps you to become more aware of your body's response to pain. It also helps you to learn ways of dealing with pain. Biofeedback is about relaxing and reducing stress. An example of a biofeedback technique is guided imagery. This involves creating peaceful images in your mind. Chromium Chromium is a substance that can help improve how insulin works in the body. Chromium is in many foods, including whole grains, nuts, and egg yolks. Chromium may also be taken as a supplement. Taking chromium supplements may help to control diabetes, especially if you have a lack of chromium (deficiency) in your body. However, research has not proven this. If you have kidney problems, you should be careful with chromium supplements. American ginseng American ginseng is an herb that may lower glucose levels. It may also help lower A1C levels. More research is needed before recommendations for ginseng use can be made. Magnesium Magnesium is a mineral found in many foods, such as whole grains, nuts, and green leafy vegetables. Having low magnesium levels may make controlling blood glucose more difficult for people who have type 2 diabetes. Low magnesium levels may also contribute to certain diabetes complications. Getting more magnesium and eating a high-fiber diet may reduce the risk of developing type 2  diabetes. Vanadium Vanadium is a compound found in small amounts in certain plants and animals. Some studies show that it improves glucose levels in animals with diabetes. In one study, people with diabetes were able to lower their insulin dosage when taking vanadium. More research about side effects and safe dosage levels is needed. Cinnamon Cinnamon may decrease insulin resistance, increase insulin production, and lower blood glucose levels. It may work best when used with diabetes medicines. Fenugreek Fenugreek is an herb whose seeds are often used in cooking. It may help lower blood glucose by decreasing carbohydrate absorption and increasing insulin production. Summary  Talk with your health care provider about complementary or alternative therapy for you. Some therapies may be appropriate for you, but others may cause side effects.  Follow your diabetes care plan as prescribed. This information is not intended to replace advice given to you by your health care provider. Make sure you discuss any questions you have with your health care provider. Document Released: 05/19/2007 Document Revised: 08/07/2016 Document Reviewed: 08/07/2016 Elsevier Interactive Patient Education  2017 [place] DASH stands for "Dietary Approaches to Stop Hypertension." The DASH eating plan is a healthy eating plan that has been shown to reduce high blood pressure (hypertension). It may also reduce your risk for type 2 diabetes, heart disease, and stroke. The DASH eating plan  may also help with weight loss. What are tips for following this plan? General guidelines  Avoid eating more than 2,300 mg (milligrams) of salt (sodium) a day. If you have hypertension, you may need to reduce your sodium intake to 1,500 mg a day.  Limit alcohol intake to no more than 1 drink a day for nonpregnant women and 2 drinks a day for men. One drink equals 12 oz of beer, 5 oz of wine, or 1 oz of hard  liquor.  Work with your health care provider to maintain a healthy body weight or to lose weight. Ask what an ideal weight is for you.  Get at least 30 minutes of exercise that causes your heart to beat faster (aerobic exercise) most days of the week. Activities may include walking, swimming, or biking.  Work with your health care provider or diet and nutrition specialist (dietitian) to adjust your eating plan to your individual calorie needs. Reading food labels  Check food labels for the amount of sodium per serving. Choose foods with less than 5 percent of the Daily Value of sodium. Generally, foods with less than 300 mg of sodium per serving fit into this eating plan.  To find whole grains, look for the word "whole" as the first word in the ingredient list. Shopping  Buy products labeled as "low-sodium" or "no salt added."  Buy fresh foods. Avoid canned foods and premade or frozen meals. Cooking  Avoid adding salt when cooking. Use salt-free seasonings or herbs instead of table salt or sea salt. Check with your health care provider or pharmacist before using salt substitutes.  Do not fry foods. Cook foods using healthy methods such as baking, boiling, grilling, and broiling instead.  Cook with heart-healthy oils, such as olive, canola, soybean, or sunflower oil. Meal planning   Eat a balanced diet that includes: ? 5 or more servings of fruits and vegetables each day. At each meal, try to fill half of your plate with fruits and vegetables. ? Up to 6-8 servings of whole grains each day. ? Less than 6 oz of lean meat, poultry, or fish each day. A 3-oz serving of meat is about the same size as a deck of cards. One egg equals 1 oz. ? 2 servings of low-fat dairy each day. ? A serving of nuts, seeds, or beans 5 times each week. ? Heart-healthy fats. Healthy fats called Omega-3 fatty acids are found in foods such as flaxseeds and coldwater fish, like sardines, salmon, and  mackerel.  Limit how much you eat of the following: ? Canned or prepackaged foods. ? Food that is high in trans fat, such as fried foods. ? Food that is high in saturated fat, such as fatty meat. ? Sweets, desserts, sugary drinks, and other foods with added sugar. ? Full-fat dairy products.  Do not salt foods before eating.  Try to eat at least 2 vegetarian meals each week.  Eat more home-cooked food and less restaurant, buffet, and fast food.  When eating at a restaurant, ask that your food be prepared with less salt or no salt, if possible. What foods are recommended? The items listed may not be a complete list. Talk with your dietitian about what dietary choices are best for you. Grains Whole-grain or whole-wheat bread. Whole-grain or whole-wheat pasta. Brown rice. [place]. Bulgur. Whole-grain and low-sodium cereals. Pita bread. Low-fat, low-sodium crackers. Whole-wheat flour tortillas. Vegetables Fresh or frozen vegetables (raw, steamed, roasted, or grilled). Low-sodium or reduced-sodium tomato and  vegetable juice. Low-sodium or reduced-sodium tomato sauce and tomato paste. Low-sodium or reduced-sodium canned vegetables. Fruits All fresh, dried, or frozen fruit. Canned fruit in natural juice (without added sugar). Meat and other protein foods Skinless chicken or [place]. Ground chicken or [place]. Pork with fat trimmed off. Fish and seafood. Egg whites. Dried beans, peas, or lentils. Unsalted nuts, nut butters, and seeds. Unsalted canned beans. Lean cuts of beef with fat trimmed off. Low-sodium, lean deli meat. Dairy Low-fat (1%) or fat-free (skim) milk. Fat-free, low-fat, or reduced-fat cheeses. Nonfat, low-sodium ricotta or cottage cheese. Low-fat or nonfat yogurt. Low-fat, low-sodium cheese. Fats and oils Soft margarine without trans fats. Vegetable oil. Low-fat, reduced-fat, or light mayonnaise and salad dressings (reduced-sodium). Canola, safflower, olive, soybean, and  sunflower oils. Avocado. Seasoning and other foods Herbs. Spices. Seasoning mixes without salt. Unsalted popcorn and pretzels. Fat-free sweets. What foods are not recommended? The items listed may not be a complete list. Talk with your dietitian about what dietary choices are best for you. Grains Baked goods made with fat, such as croissants, muffins, or some breads. Dry pasta or rice meal packs. Vegetables Creamed or fried vegetables. Vegetables in a cheese sauce. Regular canned vegetables (not low-sodium or reduced-sodium). Regular canned tomato sauce and paste (not low-sodium or reduced-sodium). Regular tomato and vegetable juice (not low-sodium or reduced-sodium). Angie Fava. Olives. Fruits Canned fruit in a light or heavy syrup. Fried fruit. Fruit in cream or butter sauce. Meat and other protein foods Fatty cuts of meat. Ribs. Fried meat. Berniece Salines. Sausage. Bologna and other processed lunch meats. Salami. Fatback. Hotdogs. Bratwurst. Salted nuts and seeds. Canned beans with added salt. Canned or smoked fish. Whole eggs or egg yolks. Chicken or [place] with skin. Dairy Whole or 2% milk, cream, and half-and-half. Whole or full-fat cream cheese. Whole-fat or sweetened yogurt. Full-fat cheese. Nondairy creamers. Whipped toppings. Processed cheese and cheese spreads. Fats and oils Butter. Stick margarine. Lard. Shortening. Ghee. Bacon fat. Tropical oils, such as coconut, palm kernel, or palm oil. Seasoning and other foods Salted popcorn and pretzels. Onion salt, garlic salt, seasoned salt, table salt, and sea salt. Worcestershire sauce. Tartar sauce. Barbecue sauce. Teriyaki sauce. Soy sauce, including reduced-sodium. Steak sauce. Canned and packaged gravies. Fish sauce. Oyster sauce. Cocktail sauce. Horseradish that you find on the shelf. Ketchup. Mustard. Meat flavorings and tenderizers. Bouillon cubes. Hot sauce and Tabasco sauce. Premade or packaged marinades. Premade or packaged taco seasonings.  Relishes. Regular salad dressings. Where to find more information:  National Heart, Lung, and [place]: https://wilson-eaton.com/  American Heart Association: www.heart.org Summary  The DASH eating plan is a healthy eating plan that has been shown to reduce high blood pressure (hypertension). It may also reduce your risk for type 2 diabetes, heart disease, and stroke.  With the DASH eating plan, you should limit salt (sodium) intake to 2,300 mg a day. If you have hypertension, you may need to reduce your sodium intake to 1,500 mg a day.  When on the DASH eating plan, aim to eat more fresh fruits and vegetables, whole grains, lean proteins, low-fat dairy, and heart-healthy fats.  Work with your health care provider or diet and nutrition specialist (dietitian) to adjust your eating plan to your individual calorie needs. This information is not intended to replace advice given to you by your health care provider. Make sure you discuss any questions you have with your health care provider. Document Released: 07/11/2011 Document Revised: 07/15/2016 Document Reviewed: 07/15/2016 Elsevier Interactive Patient Education  2018 [employer]  Exercising to Lose Weight Exercising can help you to lose weight. In order to lose weight through exercise, you need to do vigorous-intensity exercise. You can tell that you are exercising with vigorous intensity if you are breathing very hard and fast and cannot hold a conversation while exercising. Moderate-intensity exercise helps to maintain your current weight. You can tell that you are exercising at a moderate level if you have a higher heart rate and faster breathing, but you are still able to hold a conversation. How often should I exercise? Choose an activity that you enjoy and set realistic goals. Your health care provider can help you to make an activity plan that works for you. Exercise regularly as directed by your health care provider. This may  include:  Doing resistance training twice each week, such as: ? Push-ups. ? Sit-ups. ? Lifting weights. ? Using resistance bands.  Doing a given intensity of exercise for a given amount of time. Choose from these options: ? 150 minutes of moderate-intensity exercise every week. ? 75 minutes of vigorous-intensity exercise every week. ? A mix of moderate-intensity and vigorous-intensity exercise every week.  Children, pregnant women, people who are out of shape, people who are overweight, and older adults may need to consult a health care provider for individual recommendations. If you have any sort of medical condition, be sure to consult your health care provider before starting a new exercise program. What are some activities that can help me to lose weight?  Walking at a rate of at least 4.5 miles an hour.  Jogging or running at a rate of 5 miles per hour.  Biking at a rate of at least 10 miles per hour.  Lap swimming.  Roller-skating or in-line skating.  Cross-country skiing.  Vigorous competitive sports, such as football, basketball, and soccer.  Jumping rope.  Aerobic dancing. How can I be more active in my day-to-day activities?  Use the stairs instead of the elevator.  Take a walk during your lunch break.  If you drive, park your car farther away from work or school.  If you take public transportation, get off one stop early and walk the rest of the way.  Make all of your phone calls while standing up and walking around.  Get up, stretch, and walk around every 30 minutes throughout the day. What guidelines should I follow while exercising?  Do not exercise so much that you hurt yourself, feel dizzy, or get very short of breath.  Consult your health care provider prior to starting a new exercise program.  Wear comfortable clothes and shoes with good support.  Drink plenty of water while you exercise to prevent dehydration or heat stroke. Body water is lost  during exercise and must be replaced.  Work out until you breathe faster and your heart beats faster. This information is not intended to replace advice given to you by your health care provider. Make sure you discuss any questions you have with your health care provider. Document Released: 08/24/2010 Document Revised: 12/28/2015 Document Reviewed: 12/23/2013 Elsevier Interactive Patient Education  2018 [place] Maintenance, Female Adopting a healthy lifestyle and getting preventive care can go a long way to promote health and wellness. Talk with your health care provider about what schedule of regular examinations is right for you. This is a good chance for you to check in with your provider about disease prevention and staying healthy. In between checkups, there are plenty of things you can do  on your own. Experts have done a lot of research about which lifestyle changes and preventive measures are most likely to keep you healthy. Ask your health care provider for more information. Weight and diet Eat a healthy diet  Be sure to include plenty of vegetables, fruits, low-fat dairy products, and lean protein.  Do not eat a lot of foods high in solid fats, added sugars, or salt.  Get regular exercise. This is one of the most important things you can do for your health. ? Most adults should exercise for at least 150 minutes each week. The exercise should increase your heart rate and make you sweat (moderate-intensity exercise). ? Most adults should also do strengthening exercises at least twice a week. This is in addition to the moderate-intensity exercise.  Maintain a healthy weight  Body mass index (BMI) is a measurement that can be used to identify possible weight problems. It estimates body fat based on height and weight. Your health care provider can help determine your BMI and help you achieve or maintain a healthy weight.  For females 33 years of age and older: ? A BMI below  18.5 is considered underweight. ? A BMI of 18.5 to 24.9 is normal. ? A BMI of 25 to 29.9 is considered overweight. ? A BMI of 30 and above is considered obese.  Watch levels of cholesterol and blood lipids  You should start having your blood tested for lipids and cholesterol at 64 years of age, then have this test every 5 years.  You may need to have your cholesterol levels checked more often if: ? Your lipid or cholesterol levels are high. ? You are older than 64 years of age. ? You are at high risk for heart disease.  Cancer screening Lung Cancer  Lung cancer screening is recommended for adults 74-10 years old who are at high risk for lung cancer because of a history of smoking.  A yearly low-dose CT scan of the lungs is recommended for people who: ? Currently smoke. ? Have quit within the past 15 years. ? Have at least a 30-pack-year history of smoking. A pack year is smoking an average of one pack of cigarettes a day for 1 year.  Yearly screening should continue until it has been 15 years since you quit.  Yearly screening should stop if you develop a health problem that would prevent you from having lung cancer treatment.  Breast Cancer  Practice breast self-awareness. This means understanding how your breasts normally appear and feel.  It also means doing regular breast self-exams. Let your health care provider know about any changes, no matter how small.  If you are in your 20s or 30s, you should have a clinical breast exam (CBE) by a health care provider every 1-3 years as part of a regular health exam.  If you are 70 or older, have a CBE every year. Also consider having a breast X-ray (mammogram) every year.  If you have a family history of breast cancer, talk to your health care provider about genetic screening.  If you are at high risk for breast cancer, talk to your health care provider about having an MRI and a mammogram every year.  Breast cancer gene (BRCA)  assessment is recommended for women who have family members with BRCA-related cancers. BRCA-related cancers include: ? Breast. ? Ovarian. ? Tubal. ? Peritoneal cancers.  Results of the assessment will determine the need for genetic counseling and BRCA1 and BRCA2 testing.  Cervical  Cancer Your health care provider may recommend that you be screened regularly for cancer of the pelvic organs (ovaries, uterus, and vagina). This screening involves a pelvic examination, including checking for microscopic changes to the surface of your cervix (Pap test). You may be encouraged to have this screening done every 3 years, beginning at age 17.  For women ages 25-65, health care providers may recommend pelvic exams and Pap testing every 3 years, or they may recommend the Pap and pelvic exam, combined with testing for human papilloma virus (HPV), every 5 years. Some types of HPV increase your risk of cervical cancer. Testing for HPV may also be done on women of any age with unclear Pap test results.  Other health care providers may not recommend any screening for nonpregnant women who are considered low risk for pelvic cancer and who do not have symptoms. Ask your health care provider if a screening pelvic exam is right for you.  If you have had past treatment for cervical cancer or a condition that could lead to cancer, you need Pap tests and screening for cancer for at least 20 years after your treatment. If Pap tests have been discontinued, your risk factors (such as having a new sexual partner) need to be reassessed to determine if screening should resume. Some women have medical problems that increase the chance of getting cervical cancer. In these cases, your health care provider may recommend more frequent screening and Pap tests.  Colorectal Cancer  This type of cancer can be detected and often prevented.  Routine colorectal cancer screening usually begins at 64 years of age and continues through 64  years of age.  Your health care provider may recommend screening at an earlier age if you have risk factors for colon cancer.  Your health care provider may also recommend using home test kits to check for hidden blood in the stool.  A small camera at the end of a tube can be used to examine your colon directly (sigmoidoscopy or colonoscopy). This is done to check for the earliest forms of colorectal cancer.  Routine screening usually begins at age 45.  Direct examination of the colon should be repeated every 5-10 years through 64 years of age. However, you may need to be screened more often if early forms of precancerous polyps or small growths are found.  Skin Cancer  Check your skin from head to toe regularly.  Tell your health care provider about any new moles or changes in moles, especially if there is a change in a mole's shape or color.  Also tell your health care provider if you have a mole that is larger than the size of a pencil eraser.  Always use sunscreen. Apply sunscreen liberally and repeatedly throughout the day.  Protect yourself by wearing long sleeves, pants, a wide-brimmed hat, and sunglasses whenever you are outside.  Heart disease, diabetes, and high blood pressure  High blood pressure causes heart disease and increases the risk of stroke. High blood pressure is more likely to develop in: ? People who have blood pressure in the high end of the normal range (130-139/85-89 mm Hg). ? People who are overweight or obese. ? People who are African American.  If you are 54-75 years of age, have your blood pressure checked every 3-5 years. If you are 49 years of age or older, have your blood pressure checked every year. You should have your blood pressure measured twice-once when you are at a hospital or clinic,  and once when you are not at a hospital or clinic. Record the average of the two measurements. To check your blood pressure when you are not at a hospital or  clinic, you can use: ? An automated blood pressure machine at a pharmacy. ? A home blood pressure monitor.  If you are between 40 years and 51 years old, ask your health care provider if you should take aspirin to prevent strokes.  Have regular diabetes screenings. This involves taking a blood sample to check your fasting blood sugar level. ? If you are at a normal weight and have a low risk for diabetes, have this test once every three years after 64 years of age. ? If you are overweight and have a high risk for diabetes, consider being tested at a younger age or more often. Preventing infection Hepatitis B  If you have a higher risk for hepatitis B, you should be screened for this virus. You are considered at high risk for hepatitis B if: ? You were born in a country where hepatitis B is common. Ask your health care provider which countries are considered high risk. ? Your parents were born in a high-risk country, and you have not been immunized against hepatitis B (hepatitis B vaccine). ? You have HIV or AIDS. ? You use needles to inject street drugs. ? You live with someone who has hepatitis B. ? You have had sex with someone who has hepatitis B. ? You get hemodialysis treatment. ? You take certain medicines for conditions, including cancer, organ transplantation, and autoimmune conditions.  Hepatitis C  Blood testing is recommended for: ? Everyone born from [birth year] through 1967. ? Anyone with known risk factors for hepatitis C.  Sexually transmitted infections (STIs)  You should be screened for sexually transmitted infections (STIs) including gonorrhea and chlamydia if: ? You are sexually active and are younger than 64 years of age. ? You are older than 64 years of age and your health care provider tells you that you are at risk for this type of infection. ? Your sexual activity has changed since you were last screened and you are at an increased risk for chlamydia or gonorrhea.  Ask your health care provider if you are at risk.  If you do not have HIV, but are at risk, it may be recommended that you take a prescription medicine daily to prevent HIV infection. This is called pre-exposure prophylaxis (PrEP). You are considered at risk if: ? You are sexually active and do not regularly use condoms or know the HIV status of your partner(s). ? You take drugs by injection. ? You are sexually active with a partner who has HIV.  Talk with your health care provider about whether you are at high risk of being infected with HIV. If you choose to begin PrEP, you should first be tested for HIV. You should then be tested every 3 months for as long as you are taking PrEP. Pregnancy  If you are premenopausal and you may become pregnant, ask your health care provider about preconception counseling.  If you may become pregnant, take 400 to 800 micrograms (mcg) of folic acid every day.  If you want to prevent pregnancy, talk to your health care provider about birth control (contraception). Osteoporosis and menopause  Osteoporosis is a disease in which the bones lose minerals and strength with aging. This can result in serious bone fractures. Your risk for osteoporosis can be identified using a bone density scan.  If you are 1 years of age or older, or if you are at risk for osteoporosis and fractures, ask your health care provider if you should be screened.  Ask your health care provider whether you should take a calcium or vitamin D supplement to lower your risk for osteoporosis.  Menopause may have certain physical symptoms and risks.  Hormone replacement therapy may reduce some of these symptoms and risks. Talk to your health care provider about whether hormone replacement therapy is right for you. Follow these instructions at home:  Schedule regular health, dental, and eye exams.  Stay current with your immunizations.  Do not use any tobacco products including cigarettes,  chewing tobacco, or electronic cigarettes.  If you are pregnant, do not drink alcohol.  If you are breastfeeding, limit how much and how often you drink alcohol.  Limit alcohol intake to no more than 1 drink per day for nonpregnant women. One drink equals 12 ounces of beer, 5 ounces of wine, or 1 ounces of hard liquor.  Do not use street drugs.  Do not share needles.  Ask your health care provider for help if you need support or information about quitting drugs.  Tell your health care provider if you often feel depressed.  Tell your health care provider if you have ever been abused or do not feel safe at home. This information is not intended to replace advice given to you by your health care provider. Make sure you discuss any questions you have with your health care provider. Document Released: 02/04/2011 Document Revised: 12/28/2015 Document Reviewed: 04/25/2015 Elsevier Interactive Patient Education  2018 [employer]. Colesevelam tablets What is this medicine? COLESEVELAM (koh le SEV e lam) is used to lower cholesterol in patients who are at risk of heart disease or stroke. This medicine is only for patients whose cholesterol level is not controlled by diet. It is also used in combination with diet and exercise to help lower blood sugar in adults with type 2 diabetes. This medicine may be used for other purposes; ask your health care provider or pharmacist if you have questions. COMMON BRAND NAME(S): WelChol What should I tell my health care provider before I take this medicine? They need to know if you have any of these conditions: -constipation or bowel obstruction -high triglyceride levels -history of pancreatitis caused by high triglyceride levels -an unusual or allergic reaction to colesevelam, other medicines, foods, dyes, or preservatives -pregnant or trying to get pregnant -breast-feeding How should I use this medicine? Take this medicine by mouth with at least 4  ounces (half a glass) of water. Follow the directions on the prescription label. Take with food. Take your medicine at regular intervals. Do not take it more often than directed. Do not stop taking except on your doctor's advice. Talk to your pediatrician regarding the use of this medicine in children. Special care may be needed. Because of the tablet size, it is recommended that children use the oral suspension. Overdosage: If you think you have taken too much of this medicine contact a poison control center or emergency room at once. NOTE: This medicine is only for you. Do not share this medicine with others. What if I miss a dose? If you miss a dose, take it as soon as you can with your next meal. If it is almost time for your next dose, take only that dose. Do not take double or extra doses. What may interact with this medicine? -birth control pills -cyclosporine -insulin -  medicines for diabetes like glimepiride, glipizide, and glyburide -medicines for seizures like carbamazepine, phenobarbital, phenytoin -metformin -olmesartan -thyroid hormones -verapamil -vitamins -warfarin This list may not describe all possible interactions. Give your health care provider a list of all the medicines, herbs, non-prescription drugs, or dietary supplements you use. Also tell them if you smoke, drink alcohol, or use illegal drugs. Some items may interact with your medicine. What should I watch for while using this medicine? Visit your doctor or health care professional for regular checks on your progress. Your blood sugar and other tests will be measured regularly. This medicine is only part of a total cholesterol or blood sugar-lowering program. Your health care professional or dietician can suggest a low-cholesterol and low-fat diet that will reduce your risk of getting heart and blood vessel disease. Avoid alcohol and smoking, and keep a proper exercise schedule. To reduce the chance of getting  constipated, drink plenty of water and increase the amount of fiber in your diet. Ask your doctor or health care professional for advice if you are constipated. If you are taking this medicine for diabetes, wear a medical ID bracelet or chain, and carry a card that describes your disease and details of your medicine and dosage times. What side effects may I notice from receiving this medicine? Side effects that you should report to your doctor or health care professional as soon as possible: -allergic reactions like skin rash, itching or hives, swelling of the face, lips, or tongue -bloody or black, tarry stools -breathing problems -muscle pain -nausea, vomiting -severe stomach pain Side effects that usually do not require medical attention (report to your doctor or health care professional if they continue or are bothersome): -heartburn or indigestion -stomach upset This list may not describe all possible side effects. Call your doctor for medical advice about side effects. You may report side effects to FDA at 1-800-FDA-1088. Where should I keep my medicine? Keep out of the reach of children. Store at room temperature between 15 and 30 degrees C (59 and 86 degrees F). Protect from moisture. Throw away any unused medicine after the expiration date. NOTE: This sheet is a summary. It may not cover all possible information. If you have questions about this medicine, talk to your doctor, pharmacist, or health care provider.  2018 Elsevier/Gold Standard (2011-02-19 08:43:11)

## 2018-02-03 ENCOUNTER — Encounter: Payer: Self-pay | Admitting: Family Medicine

## 2018-02-03 ENCOUNTER — Ambulatory Visit: Payer: BLUE CROSS/BLUE SHIELD | Admitting: Family Medicine

## 2018-02-03 VITALS — BP 128/80 | HR 100 | Temp 98.6°F | Ht 62.0 in | Wt 210.5 lb

## 2018-02-03 DIAGNOSIS — J45909 Unspecified asthma, uncomplicated: Secondary | ICD-10-CM | POA: Insufficient documentation

## 2018-02-03 DIAGNOSIS — J4531 Mild persistent asthma with (acute) exacerbation: Secondary | ICD-10-CM

## 2018-02-03 HISTORY — DX: Unspecified asthma, uncomplicated: J45.909

## 2018-02-03 MED ORDER — HYDROCODONE-HOMATROPINE 5-1.5 MG/5ML PO SYRP: ORAL_SOLUTION | ORAL | 0 refills | Status: DC

## 2018-02-03 MED ORDER — ALBUTEROL SULFATE HFA 108 (90 BASE) MCG/ACT IN AERS: 1.0000 | INHALATION_SPRAY | Freq: Four times a day (QID) | RESPIRATORY_TRACT | 2 refills | Status: DC | PRN

## 2018-02-03 MED ORDER — AZITHROMYCIN 250 MG PO TABS: ORAL_TABLET | ORAL | 0 refills | Status: DC

## 2018-02-03 NOTE — Progress Notes (Signed)
Subjective:  Patient ID: Kathy [name], female    DOB: Jun 06, 1956  Age: 62 y.o. MRN: [id number]  CC: Bronchitis (x a week and a half, no fever.)   HPI [name] presents for evaluation of a 7-day history of cough.  There is been no fever chills, postnasal drip, sputum production, facial pressure or teeth pain.  She has been wheezy.  She has no history of asthma or tobacco use.  She is used inhalers in the past.  She is coming in on Friday for a physical exam.  She has been using Robitussin-DM.  Her cough is continuous day and night.  Outpatient Medications Prior to Visit  Medication Sig Dispense Refill  . ALPRAZolam (XANAX) 0.5 MG tablet Take 0.5 mg by mouth at bedtime as needed for anxiety.     . colesevelam (WELCHOL) 625 MG tablet Take 3 tablets (1,875 mg total) by mouth 2 (two) times daily with a meal. 540 tablet 1  . diltiazem (TIAZAC) 360 MG 24 hr capsule Take 1 capsule (360 mg total) by mouth every morning. 90 capsule 1  . levocetirizine (XYZAL) 5 MG tablet Take 5 mg by mouth every evening.    Marland Kitchen lisinopril (PRINIVIL,ZESTRIL) 20 MG tablet Take 1 tablet (20 mg total) by mouth daily. 90 tablet 1  . metFORMIN (GLUCOPHAGE-XR) 500 MG 24 hr tablet Take 2 tablets (1,000 mg total) by mouth at bedtime. 180 tablet 1  . triamterene-hydrochlorothiazide (MAXZIDE-25) 37.5-25 MG tablet Take 1 tablet by mouth daily. 90 tablet 1   No facility-administered medications prior to visit.     ROS Review of Systems  Constitutional: Negative for activity change, chills, fatigue, fever and unexpected weight change.  HENT: Negative for congestion, postnasal drip, rhinorrhea, sinus pressure, sinus pain and sore throat.   Eyes: Negative.   Respiratory: Positive for cough and wheezing. Negative for shortness of breath.   Cardiovascular: Negative.   Gastrointestinal: Negative.   Musculoskeletal: Negative for arthralgias and myalgias.  Skin: Negative for pallor and rash.  Allergic/Immunologic:  Negative for immunocompromised state.  Hematological: Negative for adenopathy. Does not bruise/bleed easily.  Psychiatric/Behavioral: Negative.     Objective:  BP 128/80   Pulse 100   Temp 98.6 F (37 C)   Ht 5\' 2"  (1.575 m)   Wt 210 lb 8 oz (95.5 kg)   SpO2 95%   BMI 38.50 kg/m   BP Readings from Last 3 Encounters:  02/03/18 128/80  12/09/17 134/80  10/09/17 130/80    Wt Readings from Last 3 Encounters:  02/03/18 210 lb 8 oz (95.5 kg)  12/09/17 217 lb 2 oz (98.5 kg)  10/09/17 217 lb 4 oz (98.5 kg)    Physical Exam  Constitutional: She is oriented to person, place, and time. She appears well-developed and well-nourished. No distress.  HENT:  Head: Normocephalic.  Right Ear: External ear normal.  Left Ear: External ear normal.  Mouth/Throat: Oropharynx is clear and moist. No oropharyngeal exudate.  Eyes: Pupils are equal, round, and reactive to light. Conjunctivae and EOM are normal. Right eye exhibits no discharge. Left eye exhibits no discharge. No scleral icterus.  Neck: Neck supple. No JVD present. No tracheal deviation present. No thyromegaly present.  Cardiovascular: Normal rate, regular rhythm and normal heart sounds.  Pulmonary/Chest: Effort normal and breath sounds normal. No stridor. No respiratory distress. She has no wheezes. She has no rales.  Abdominal: Bowel sounds are normal.  Neurological: She is alert and oriented to person, place, and time.  Skin: Skin is warm and dry. She is not diaphoretic.  Psychiatric: She has a normal mood and affect. Her behavior is normal.    Lab Results  Component Value Date   WBC 9.0 01/28/2017   HGB 13.2 01/28/2017   HCT 39 01/28/2017   PLT 353 01/28/2017   GLUCOSE 104 (H) 03/18/2016   CHOL 208 (A) 01/28/2017   TRIG 202 (A) 01/28/2017   HDL 38 01/28/2017   LDLCALC 170 01/28/2017   ALT 16 01/28/2017   AST 12 (A) 01/28/2017   NA 137 01/28/2017   K 4.1 01/28/2017   CL 104 03/18/2016   CREATININE 1.1 01/28/2017    BUN 24 (A) 01/28/2017   CO2 25 03/18/2016   TSH 2.02 01/28/2017   INR 1.26 01/16/2016   HGBA1C 6.2 (H) 03/18/2016    No results found.  Assessment & Plan:   Kathy [name] was seen today for bronchitis.  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 Or cough -     HYDROcodone-homatropine (HYCODAN) 5-1.5 MG/5ML syrup; Take tsp at night as needed for cough. -     azithromycin (ZITHROMAX) 250 MG tablet; Take 2 today and then one each day until finished.   I am having Kathy [name] start on albuterol, HYDROcodone-homatropine, and azithromycin. I am also having her maintain her ALPRAZolam, levocetirizine, diltiazem, lisinopril, triamterene-hydrochlorothiazide, metFORMIN, and colesevelam.  Meds ordered this encounter  Medications  . albuterol (PROVENTIL HFA;VENTOLIN HFA) 108 (90 Base) MCG/ACT inhaler    Sig: Inhale 1 puff into the lungs every 6 (six) hours as needed for wheezing or shortness of breath. Or cough    Dispense:  1 Inhaler    Refill:  2  . HYDROcodone-homatropine (HYCODAN) 5-1.5 MG/5ML syrup    Sig: Take tsp at night as needed for cough.    Dispense:  120 mL    Refill:  0  . azithromycin (ZITHROMAX) 250 MG tablet    Sig: Take 2 today and then one each day until finished.    Dispense:  6 tablet    Refill:  0   She will use the Proventil inhaler for cough annual and/or wheezing.  Hydromet cough syrup for nighttime use only.  Will be using Zithromax for its anti-inflammatory properties.  We will see her in follow-up for a physical exam.  Follow-up: Return return on friday for physical exam. .  Mliss SaxWilliam Alfred Shamia Uppal, MD

## 2018-02-04 ENCOUNTER — Other Ambulatory Visit: Payer: BLUE CROSS/BLUE SHIELD

## 2018-02-04 DIAGNOSIS — E782 Mixed hyperlipidemia: Secondary | ICD-10-CM

## 2018-02-04 DIAGNOSIS — E790 Hyperuricemia without signs of inflammatory arthritis and tophaceous disease: Secondary | ICD-10-CM

## 2018-02-04 DIAGNOSIS — Z0001 Encounter for general adult medical examination with abnormal findings: Secondary | ICD-10-CM

## 2018-02-04 DIAGNOSIS — I10 Essential (primary) hypertension: Secondary | ICD-10-CM

## 2018-02-04 DIAGNOSIS — E119 Type 2 diabetes mellitus without complications: Secondary | ICD-10-CM

## 2018-02-04 NOTE — Addendum Note (Signed)
Addended by: Varney BilesWIESNER, Ginelle Bays M on: 02/04/2018 10:26 AM   Modules accepted: Orders

## 2018-02-06 ENCOUNTER — Encounter: Payer: Self-pay | Admitting: Family Medicine

## 2018-02-06 ENCOUNTER — Ambulatory Visit (INDEPENDENT_AMBULATORY_CARE_PROVIDER_SITE_OTHER): Payer: BLUE CROSS/BLUE SHIELD | Admitting: Family Medicine

## 2018-02-06 VITALS — BP 126/80 | HR 66 | Temp 98.0°F | Ht 62.0 in | Wt 210.0 lb

## 2018-02-06 DIAGNOSIS — E782 Mixed hyperlipidemia: Secondary | ICD-10-CM | POA: Diagnosis not present

## 2018-02-06 DIAGNOSIS — E559 Vitamin D deficiency, unspecified: Secondary | ICD-10-CM

## 2018-02-06 DIAGNOSIS — J4531 Mild persistent asthma with (acute) exacerbation: Secondary | ICD-10-CM | POA: Diagnosis not present

## 2018-02-06 DIAGNOSIS — I10 Essential (primary) hypertension: Secondary | ICD-10-CM | POA: Diagnosis not present

## 2018-02-06 DIAGNOSIS — Z0001 Encounter for general adult medical examination with abnormal findings: Secondary | ICD-10-CM | POA: Diagnosis not present

## 2018-02-06 DIAGNOSIS — E119 Type 2 diabetes mellitus without complications: Secondary | ICD-10-CM

## 2018-02-06 DIAGNOSIS — E790 Hyperuricemia without signs of inflammatory arthritis and tophaceous disease: Secondary | ICD-10-CM | POA: Diagnosis not present

## 2018-02-06 HISTORY — DX: Vitamin D deficiency, unspecified: E55.9

## 2018-02-06 HISTORY — DX: Encounter for general adult medical examination with abnormal findings: Z00.01

## 2018-02-06 LAB — URINALYSIS, ROUTINE W REFLEX MICROSCOPIC
Bilirubin Urine: NEGATIVE
Hgb urine dipstick: NEGATIVE
Ketones, ur: NEGATIVE
Leukocytes, UA: NEGATIVE
Nitrite: NEGATIVE
RBC / HPF: NONE SEEN (ref 0–?)
Specific Gravity, Urine: <=1.005 — AB (ref 1.000–1.030)
Total Protein, Urine: NEGATIVE
Urine Glucose: NEGATIVE
Urobilinogen, UA: 0.2 (ref 0.0–1.0)
pH: 6 (ref 5.0–8.0)

## 2018-02-06 LAB — CBC
HCT: 39.7 % (ref 36.0–46.0)
Hemoglobin: 13.5 g/dL (ref 12.0–15.0)
MCHC: 34 g/dL (ref 30.0–36.0)
MCV: 89.3 fl (ref 78.0–100.0)
Platelets: 453 10*3/uL — ABNORMAL HIGH (ref 150.0–400.0)
RBC: 4.45 Mil/uL (ref 3.87–5.11)
RDW: 13.5 % (ref 11.5–15.5)
WBC: 9.2 10*3/uL (ref 4.0–10.5)

## 2018-02-06 LAB — COMPREHENSIVE METABOLIC PANEL
ALT: 50 U/L — ABNORMAL HIGH (ref 0–35)
AST: 38 U/L — ABNORMAL HIGH (ref 0–37)
Albumin: 4.6 g/dL (ref 3.5–5.2)
Alkaline Phosphatase: 99 U/L (ref 39–117)
BUN: 18 mg/dL (ref 6–23)
CO2: 30 mEq/L (ref 19–32)
Calcium: 10.2 mg/dL (ref 8.4–10.5)
Chloride: 97 mEq/L (ref 96–112)
Creatinine, Ser: 0.94 mg/dL (ref 0.40–1.20)
GFR: 63.71 mL/min (ref >60.00)
Glucose, Bld: 124 mg/dL — ABNORMAL HIGH (ref 70–99)
Potassium: 4.7 mEq/L (ref 3.5–5.1)
Sodium: 137 mEq/L (ref 135–145)
Total Bilirubin: 0.4 mg/dL (ref 0.2–1.2)
Total Protein: 7.2 g/dL (ref 6.0–8.3)

## 2018-02-06 LAB — LIPID PANEL
Cholesterol: 215 mg/dL — ABNORMAL HIGH (ref 0–200)
HDL: 43.2 mg/dL (ref >39.00)
LDL Cholesterol: 150 mg/dL — ABNORMAL HIGH (ref 0–99)
NonHDL: 172.09
Total CHOL/HDL Ratio: 5
Triglycerides: 112 mg/dL (ref 0.0–149.0)
VLDL: 22.4 mg/dL (ref 0.0–40.0)

## 2018-02-06 LAB — HEMOGLOBIN A1C: Hgb A1c MFr Bld: 6.5 % (ref 4.6–6.5)

## 2018-02-06 LAB — MICROALBUMIN / CREATININE URINE RATIO
Creatinine,U: 29.1 mg/dL
Microalb Creat Ratio: 2.4 mg/g (ref 0.0–30.0)
Microalb, Ur: <0.7 mg/dL (ref 0.0–1.9)

## 2018-02-06 LAB — URIC ACID: Uric Acid, Serum: 10.2 mg/dL — ABNORMAL HIGH (ref 2.4–7.0)

## 2018-02-06 LAB — VITAMIN D 25 HYDROXY (VIT D DEFICIENCY, FRACTURES): VITD: 28.96 ng/mL — ABNORMAL LOW (ref 30.00–100.00)

## 2018-02-06 MED ORDER — PREDNISONE 10 MG PO TABS: 10.0000 mg | ORAL_TABLET | Freq: Two times a day (BID) | ORAL | 0 refills | Status: AC

## 2018-02-06 MED ORDER — VITAMIN D (ERGOCALCIFEROL) 1.25 MG (50000 UNIT) PO CAPS: 50000.0000 [IU] | ORAL_CAPSULE | ORAL | 5 refills | Status: DC

## 2018-02-06 NOTE — Addendum Note (Signed)
Addended by: Andrez GrimeKREMER, Lynnae Ludemann A on: 02/06/2018 02:33 PM   Modules accepted: Orders

## 2018-02-06 NOTE — Patient Instructions (Signed)
Exercising to Lose Weight Exercising can help you to lose weight. In order to lose weight through exercise, you need to do vigorous-intensity exercise. You can tell that you are exercising with vigorous intensity if you are breathing very hard and fast and cannot hold a conversation while exercising. Moderate-intensity exercise helps to maintain your current weight. You can tell that you are exercising at a moderate level if you have a higher heart rate and faster breathing, but you are still able to hold a conversation. How often should I exercise? Choose an activity that you enjoy and set realistic goals. Your health care provider can help you to make an activity plan that works for you. Exercise regularly as directed by your health care provider. This may include:  Doing resistance training twice each week, such as: ? Push-ups. ? Sit-ups. ? Lifting weights. ? Using resistance bands.  Doing a given intensity of exercise for a given amount of time. Choose from these options: ? 150 minutes of moderate-intensity exercise every week. ? 75 minutes of vigorous-intensity exercise every week. ? A mix of moderate-intensity and vigorous-intensity exercise every week.  Children, pregnant women, people who are out of shape, people who are overweight, and older adults may need to consult a health care provider for individual recommendations. If you have any sort of medical condition, be sure to consult your health care provider before starting a new exercise program. What are some activities that can help me to lose weight?  Walking at a rate of at least 4.5 miles an hour.  Jogging or running at a rate of 5 miles per hour.  Biking at a rate of at least 10 miles per hour.  Lap swimming.  Roller-skating or in-line skating.  Cross-country skiing.  Vigorous competitive sports, such as football, basketball, and soccer.  Jumping rope.  Aerobic dancing. How can I be more active in my day-to-day  activities?  Use the stairs instead of the elevator.  Take a walk during your lunch break.  If you drive, park your car farther away from work or school.  If you take public transportation, get off one stop early and walk the rest of the way.  Make all of your phone calls while standing up and walking around.  Get up, stretch, and walk around every 30 minutes throughout the day. What guidelines should I follow while exercising?  Do not exercise so much that you hurt yourself, feel dizzy, or get very short of breath.  Consult your health care provider prior to starting a new exercise program.  Wear comfortable clothes and shoes with good support.  Drink plenty of water while you exercise to prevent dehydration or heat stroke. Body water is lost during exercise and must be replaced.  Work out until you breathe faster and your heart beats faster. This information is not intended to replace advice given to you by your health care provider. Make sure you discuss any questions you have with your health care provider. Document Released: 08/24/2010 Document Revised: 12/28/2015 Document Reviewed: 12/23/2013 Elsevier Interactive Patient Education  2018 [employer]  

## 2018-02-06 NOTE — Progress Notes (Addendum)
Subjective:  Patient ID: Kathy [name], female    DOB: 1955/09/12  Age: 62 y.o. MRN: [id number]  CC: Annual Exam   HPI Kathy [name] presents for follow-up of her reactive airway disease.  She continues to cough a great deal and Zithromax and hydrocodone cough syrup of only been minimally effective.  Albuterol inhaler seems to help most.  She is only taking it every 6 hours.  She is also here for physical.  Her blood pressure has been coming controlled with diltiazem, lisinopril and Dyazide.  She is having no issues with these medications.  Diabetes has been controlled with thousand milligrams of Glucophage Exar at night.  She is taking WelChol for her cholesterol.  She is intolerant of statins.  She has been unable to take WelChol this past week because of her reactive airway disease.  She is not exercising on a regular basis.  She will have an eye exam in the fall.  Outpatient Medications Prior to Visit  Medication Sig Dispense Refill  . albuterol (PROVENTIL HFA;VENTOLIN HFA) 108 (90 Base) MCG/ACT inhaler Inhale 1 puff into the lungs every 6 (six) hours as needed for wheezing or shortness of breath. Or cough 1 Inhaler 2  . ALPRAZolam (XANAX) 0.5 MG tablet Take 0.5 mg by mouth at bedtime as needed for anxiety.     Marland Kitchen. azithromycin (ZITHROMAX) 250 MG tablet Take 2 today and then one each day until finished. 6 tablet 0  . colesevelam (WELCHOL) 625 MG tablet Take 3 tablets (1,875 mg total) by mouth 2 (two) times daily with a meal. 540 tablet 1  . diltiazem (TIAZAC) 360 MG 24 hr capsule Take 1 capsule (360 mg total) by mouth every morning. 90 capsule 1  . HYDROcodone-homatropine (HYCODAN) 5-1.5 MG/5ML syrup Take tsp at night as needed for cough. 120 mL 0  . levocetirizine (XYZAL) 5 MG tablet Take 5 mg by mouth every evening.    Marland Kitchen. lisinopril (PRINIVIL,ZESTRIL) 20 MG tablet Take 1 tablet (20 mg total) by mouth daily. 90 tablet 1  . metFORMIN (GLUCOPHAGE-XR) 500 MG 24 hr tablet Take 2 tablets  (1,000 mg total) by mouth at bedtime. 180 tablet 1  . triamterene-hydrochlorothiazide (MAXZIDE-25) 37.5-25 MG tablet Take 1 tablet by mouth daily. 90 tablet 1   No facility-administered medications prior to visit.     ROS Review of Systems  Constitutional: Negative for chills, fatigue, fever and unexpected weight change.  HENT: Negative.   Eyes: Negative.   Respiratory: Positive for cough and wheezing. Negative for shortness of breath.   Cardiovascular: Negative.   Gastrointestinal: Negative.   Endocrine: Negative for polyphagia and polyuria.  Genitourinary: Negative.   Musculoskeletal: Negative for arthralgias and myalgias.  Skin: Negative for pallor and rash.  Allergic/Immunologic: Negative for immunocompromised state.  Neurological: Negative for weakness and headaches.  Hematological: Does not bruise/bleed easily.  Psychiatric/Behavioral: Negative.     Objective:  BP 126/80   Pulse 66   Temp 98 F (36.7 C)   Ht 5\' 2"  (1.575 m)   Wt 210 lb (95.3 kg)   SpO2 96%   BMI 38.41 kg/m   BP Readings from Last 3 Encounters:  02/06/18 126/80  02/03/18 128/80  12/09/17 134/80    Wt Readings from Last 3 Encounters:  02/06/18 210 lb (95.3 kg)  02/03/18 210 lb 8 oz (95.5 kg)  12/09/17 217 lb 2 oz (98.5 kg)    Physical Exam  Constitutional: She is oriented to person, place, and time. She appears  well-developed and well-nourished. No distress.  HENT:  Head: Normocephalic and atraumatic.  Right Ear: External ear normal.  Left Ear: External ear normal.  Mouth/Throat: Oropharynx is clear and moist. No oropharyngeal exudate.  Eyes: Pupils are equal, round, and reactive to light. Conjunctivae and EOM are normal. Right eye exhibits no discharge. Left eye exhibits no discharge. No scleral icterus.  Neck: Neck supple. No JVD present. No tracheal deviation present. No thyromegaly present.  Cardiovascular: Normal rate, regular rhythm and normal heart sounds.  Pulses:      Dorsalis  pedis pulses are 1+ on the right side, and 1+ on the left side.       Posterior tibial pulses are 1+ on the right side, and 2+ on the left side.  Pulmonary/Chest: Effort normal and breath sounds normal.  Abdominal: Bowel sounds are normal.  Lymphadenopathy:    She has no cervical adenopathy.  Neurological: She is alert and oriented to person, place, and time.  Skin: Skin is warm and dry. Capillary refill takes less than 2 seconds. She is not diaphoretic.  Psychiatric: She has a normal mood and affect. Her behavior is normal.   Diabetic Foot Exam   Diabetic Foot Exam - Simple   Simple Foot Form  Visual Inspection  No deformities, no ulcerations, no other skin breakdown bilaterally: Yes  [name] comments: Yes  Sensation Testing  Intact to touch and monofilament testing bilaterally: Yes  Pulse Check  [name] comments: Yes  Comments  Feet are cavus     Lab Results  Component Value Date   WBC 9.2 02/06/2018   HGB 13.5 02/06/2018   HCT 39.7 02/06/2018   PLT 453.0 (H) 02/06/2018   GLUCOSE 124 (H) 02/06/2018   CHOL 215 (H) 02/06/2018   TRIG 112.0 02/06/2018   HDL 43.20 02/06/2018   LDLCALC 150 (H) 02/06/2018   ALT 50 (H) 02/06/2018   AST 38 (H) 02/06/2018   NA 137 02/06/2018   K 4.7 02/06/2018   CL 97 02/06/2018   CREATININE 0.94 02/06/2018   BUN 18 02/06/2018   CO2 30 02/06/2018   TSH 2.02 01/28/2017   INR 1.26 01/16/2016   HGBA1C 6.5 02/06/2018   MICROALBUR <0.7 02/06/2018    No results found.  Assessment & Plan:   Kathy [name] was seen today for annual exam.  Diagnoses and all orders for this visit:  Essential hypertension -     Urinalysis, Routine w reflex microscopic -     Comprehensive metabolic panel -     CBC  Mild persistent reactive airway disease with acute exacerbation -     predniSONE (DELTASONE) 10 MG tablet; Take 1 tablet (10 mg total) by mouth 2 (two) times daily with a meal for 5 days.  Diabetes mellitus without complication (HCC) -     Amb ref to  Medical Nutrition Therapy-MNT -     Microalbumin / creatinine urine ratio -     Urinalysis, Routine w reflex microscopic -     Hemoglobin A1c -     Comprehensive metabolic panel -     CBC  Mixed hyperlipidemia -     Lipid panel -     Comprehensive metabolic panel  Morbid obesity (HCC) -     Amb ref to Medical Nutrition Therapy-MNT  Encounter for health maintenance examination with abnormal findings -     VITAMIN D 25 Hydroxy (Vit-D Deficiency, Fractures) -     Comprehensive metabolic panel -     CBC  Elevated uric acid  in blood -     Uric acid  Vitamin D deficiency -     Vitamin D, Ergocalciferol, (DRISDOL) 50000 units CAPS capsule; Take 1 capsule (50,000 Units total) by mouth every 7 (seven) days.   I am having Kathy [name]. Kathy [name] start on predniSONE and Vitamin D (Ergocalciferol). I am also having her maintain her ALPRAZolam, levocetirizine, diltiazem, lisinopril, triamterene-hydrochlorothiazide, metFORMIN, colesevelam, albuterol, HYDROcodone-homatropine, and azithromycin.  Meds ordered this encounter  Medications  . predniSONE (DELTASONE) 10 MG tablet    Sig: Take 1 tablet (10 mg total) by mouth 2 (two) times daily with a meal for 5 days.    Dispense:  10 tablet    Refill:  0  . Vitamin D, Ergocalciferol, (DRISDOL) 50000 units CAPS capsule    Sig: Take 1 capsule (50,000 Units total) by mouth every 7 (seven) days.    Dispense:  5 capsule    Refill:  5   Have asked the patient to use her inhaler up to every 4 hours.  Added prednisone 10 mg twice daily for 5 days.  Hopefully her LDL cholesterol will be lower with the WelChol.  May need to add Zetia.  Patient is agreeable to go for nutrition counseling.  I have urged her to try to start exercising.  Anticipatory guidance was given to her for exercising and losing weight.  Follow-up: Return in about 3 months (around 05/09/2018).  [name], MD

## 2018-02-19 ENCOUNTER — Ambulatory Visit: Payer: BLUE CROSS/BLUE SHIELD | Admitting: Family Medicine

## 2018-02-19 ENCOUNTER — Encounter: Payer: Self-pay | Admitting: Family Medicine

## 2018-02-19 ENCOUNTER — Ambulatory Visit (INDEPENDENT_AMBULATORY_CARE_PROVIDER_SITE_OTHER): Payer: BLUE CROSS/BLUE SHIELD

## 2018-02-19 VITALS — BP 138/80 | HR 80 | Temp 97.7°F | Ht 62.0 in

## 2018-02-19 DIAGNOSIS — R05 Cough: Secondary | ICD-10-CM

## 2018-02-19 DIAGNOSIS — E782 Mixed hyperlipidemia: Secondary | ICD-10-CM

## 2018-02-19 DIAGNOSIS — E559 Vitamin D deficiency, unspecified: Secondary | ICD-10-CM | POA: Diagnosis not present

## 2018-02-19 DIAGNOSIS — J4531 Mild persistent asthma with (acute) exacerbation: Secondary | ICD-10-CM

## 2018-02-19 DIAGNOSIS — E790 Hyperuricemia without signs of inflammatory arthritis and tophaceous disease: Secondary | ICD-10-CM

## 2018-02-19 DIAGNOSIS — R059 Cough, unspecified: Secondary | ICD-10-CM | POA: Insufficient documentation

## 2018-02-19 HISTORY — DX: Cough, unspecified: R05.9

## 2018-02-19 MED ORDER — PREDNISONE 10 MG (48) PO TBPK: ORAL_TABLET | ORAL | 0 refills | Status: DC

## 2018-02-19 NOTE — Progress Notes (Signed)
Subjective:  Patient ID: Kathy [name], female    DOB: June 17, 1956  Age: 62 y.o. MRN: [id number]  CC: Cough   HPI Kathy [name] presents for patient's cough persists despite treatment with a 5-day course of prednisone.  Cough does respond to her inhaler.  It is not responding to hydrocodone cough syrup.  Cough is persistent and dry.  There is no fever or chills.  She has no history of reflux.  There is no chronic postnasal drip.  She has been on lisinopril for years.  I explained that cough associated with lisinopril is entirely different from this cough.  She recently started her high-dose vitamin D.  She recently restarted her WelChol.  She has had no further gouty flares despite her high uric acid levels.  Outpatient Medications Prior to Visit  Medication Sig Dispense Refill  . albuterol (PROVENTIL HFA;VENTOLIN HFA) 108 (90 Base) MCG/ACT inhaler Inhale 1 puff into the lungs every 6 (six) hours as needed for wheezing or shortness of breath. Or cough 1 Inhaler 2  . ALPRAZolam (XANAX) 0.5 MG tablet Take 0.5 mg by mouth at bedtime as needed for anxiety.     . colesevelam (WELCHOL) 625 MG tablet Take 3 tablets (1,875 mg total) by mouth 2 (two) times daily with a meal. 540 tablet 1  . diltiazem (TIAZAC) 360 MG 24 hr capsule Take 1 capsule (360 mg total) by mouth every morning. 90 capsule 1  . levocetirizine (XYZAL) 5 MG tablet Take 5 mg by mouth every evening.    Marland Kitchen. lisinopril (PRINIVIL,ZESTRIL) 20 MG tablet Take 1 tablet (20 mg total) by mouth daily. 90 tablet 1  . metFORMIN (GLUCOPHAGE-XR) 500 MG 24 hr tablet Take 2 tablets (1,000 mg total) by mouth at bedtime. 180 tablet 1  . triamterene-hydrochlorothiazide (MAXZIDE-25) 37.5-25 MG tablet Take 1 tablet by mouth daily. 90 tablet 1  . Vitamin D, Ergocalciferol, (DRISDOL) 50000 units CAPS capsule Take 1 capsule (50,000 Units total) by mouth every 7 (seven) days. 5 capsule 5  . azithromycin (ZITHROMAX) 250 MG tablet Take 2 today and then one  each day until finished. 6 tablet 0  . HYDROcodone-homatropine (HYCODAN) 5-1.5 MG/5ML syrup Take tsp at night as needed for cough. 120 mL 0   No facility-administered medications prior to visit.     ROS Review of Systems  Constitutional: Negative for chills, fatigue, fever and unexpected weight change.  HENT: Negative for postnasal drip, rhinorrhea, sinus pressure and sinus pain.   Respiratory: Positive for cough and wheezing. Negative for chest tightness.   Cardiovascular: Negative.   Gastrointestinal: Negative.   Endocrine: Negative for polyphagia and polyuria.  Genitourinary: Negative for difficulty urinating and frequency.  Musculoskeletal: Negative for arthralgias and myalgias.  Skin: Negative for pallor and rash.  Allergic/Immunologic: Negative for immunocompromised state.  Neurological: Negative for weakness and headaches.  Hematological: Does not bruise/bleed easily.  Psychiatric/Behavioral: Negative.     Objective:  BP 138/80   Pulse 80   Temp 97.7 F (36.5 C)   Ht 5\' 2"  (1.575 m)   SpO2 97%   BMI 38.41 kg/m   BP Readings from Last 3 Encounters:  02/19/18 138/80  02/06/18 126/80  02/03/18 128/80    Wt Readings from Last 3 Encounters:  02/06/18 210 lb (95.3 kg)  02/03/18 210 lb 8 oz (95.5 kg)  12/09/17 217 lb 2 oz (98.5 kg)    Physical Exam  Constitutional: She is oriented to person, place, and time. She appears well-developed and well-nourished. No  distress.  HENT:  Head: Normocephalic and atraumatic.  Right Ear: External ear normal.  Left Ear: External ear normal.  Mouth/Throat: Oropharynx is clear and moist. No oropharyngeal exudate.  Eyes: Pupils are equal, round, and reactive to light. Conjunctivae and EOM are normal. Right eye exhibits no discharge. Left eye exhibits no discharge. No scleral icterus.  Neck: Neck supple. No JVD present. No tracheal deviation present. No thyromegaly present.  Cardiovascular: Normal rate, regular rhythm and normal  heart sounds.  Pulmonary/Chest: Effort normal and breath sounds normal. No stridor. No respiratory distress. She has no wheezes. She has no rales.  Abdominal: Bowel sounds are normal.  Lymphadenopathy:    She has no cervical adenopathy.  Neurological: She is alert and oriented to person, place, and time.  Skin: Skin is warm and dry. She is not diaphoretic.  Psychiatric: She has a normal mood and affect. Her behavior is normal.    Lab Results  Component Value Date   WBC 9.2 02/06/2018   HGB 13.5 02/06/2018   HCT 39.7 02/06/2018   PLT 453.0 (H) 02/06/2018   GLUCOSE 124 (H) 02/06/2018   CHOL 215 (H) 02/06/2018   TRIG 112.0 02/06/2018   HDL 43.20 02/06/2018   LDLCALC 150 (H) 02/06/2018   ALT 50 (H) 02/06/2018   AST 38 (H) 02/06/2018   NA 137 02/06/2018   K 4.7 02/06/2018   CL 97 02/06/2018   CREATININE 0.94 02/06/2018   BUN 18 02/06/2018   CO2 30 02/06/2018   TSH 2.02 01/28/2017   INR 1.26 01/16/2016   HGBA1C 6.5 02/06/2018   MICROALBUR <0.7 02/06/2018    No results found.  Assessment & Plan:   Kathy [name] was seen today for cough.  Diagnoses and all orders for this visit:  Mild persistent reactive airway disease with acute exacerbation -     predniSONE (STERAPRED UNI-PAK 48 TAB) 10 MG (48) TBPK tablet; Pharm to instruct a 12 day dose pack.  Elevated uric acid in blood  Vitamin D deficiency  Mixed hyperlipidemia  Cough -     CBC -     DG Chest 2 View; Future -     predniSONE (STERAPRED UNI-PAK 48 TAB) 10 MG (48) TBPK tablet; Pharm to instruct a 12 day dose pack.   I have discontinued Kathy [name]. [name]'s HYDROcodone-homatropine and azithromycin. I am also having her start on predniSONE. Additionally, I am having her maintain her ALPRAZolam, levocetirizine, diltiazem, lisinopril, triamterene-hydrochlorothiazide, metFORMIN, colesevelam, albuterol, and Vitamin D (Ergocalciferol).  Meds ordered this encounter  Medications  . predniSONE (STERAPRED UNI-PAK 48 TAB) 10  MG (48) TBPK tablet    Sig: Pharm to instruct a 12 day dose pack.    Dispense:  48 tablet    Refill:  0   Patient will require a 12-day Dosepak.  Advised her that this could affect her blood sugars.  Her diabetes is been under good control.  We also discussed her low vitamin D levels and elevated uric acid.  Discussed that her uric acid will probably need treatment.  Discussed the possibility of using allopurinol for this.  She will follow-up if her cough does not improve with a 12-day Dosepak otherwise I will see her in 3 months.  She will continue to use her inhaler as needed.  Anticipatory guidance was given to her with regards to elevated uric acid and its impact on gallop.  Follow-up: No follow-ups on file.  [name], MD

## 2018-02-19 NOTE — Patient Instructions (Signed)
Uric Acid Test Why am I having this test? Uric acid is a chemical that results when certain substances in your body (purines) break down. You have purines in all your cells. When cells die, they release purines into your blood. You can also get purines from your diet. They are found in foods such as liver, mackerel, beans, and beer. Uric acid is mostly removed from your body by your kidneys. If your body is making too much uric acid, or if your kidneys are not removing it, a crystal form of uric acid can build up in your joints or kidneys. Uric acid crystals in your joints can cause a type of arthritis (gout). Crystals in your urine can form kidney stones. What kind of sample is taken? Uric acid can be measured in blood and urine, so you may have either a blood test or a urine test.  Blood test. This test requires a blood sample taken from a vein in your hand or arm. You may have this test if you have joint pain that may be due to gout. You may also have this test if you are getting a type of cancer treatment that increases cell death and purines. This can lead to high uric acid levels.  Urine test. This test requires a sample of urine. You may need to collect all the urine you produce over a 24-hour period (24-hour urine sample). You may have the urine test if you have kidney stones. Finding out how much uric acid you are passing in your urine can help your health care provider determine the cause of your kidney stones.  How do I prepare for this test?  You may not be able to eat for 4 hours before the blood test or as directed by your health care provider.  Let your health care provider know if: ? You have recently exercised a lot. Heavy exercise can increase uric acid levels. ? You are taking any medicines. Some medicines can also affect uric acid. What do the results mean? Your results will be compared with a reference range of values for this test. The reference ranges for the blood test are  as follows:  Adult: ? Female: 4.0-8.5 mg/dL or 0.24-0.51 mmol/L. ? Female: 2.7-7.3 mg/dL or 0.16-0.43 mmol/L.  Child: 2.5-5.5 mg/dL or 0.12-0.32 mmol/L.  Newborn: 2.0-6.2 mg/dL.  The reference range for the urine test is 250-750 mg per 24 hours or 1.48-4.43 mmol per day (SI units). Abnormally high levels of uric acid may mean:  Your body is making too much uric acid.  Your kidneys are not removing enough uric acid.  You may need to have other tests. Many things can cause a high level of uric acid. Common causes include:  Gout.  Kidney disease.  Cancer or cancer treatment.  A diet high in purines.  Alcohol abuse.  Diabetes.  Certain medicines can cause low levels of uric acid, but this is usually not a cause for concern. Reference rangesmay vary among different labs and hospitals. Talk to your health care provider to discuss your results, treatment options, and if necessary, the need for more tests. It is your responsibility to obtain your test results. Ask the lab or department performing the test when and how you will get your results. Talk with your health care provider if you have any questions about your results. Talk with your health care provider to discuss your results, treatment options, and if necessary, the need for more tests. Talk with your health care   provider if you have any questions about your results. This information is not intended to replace advice given to you by your health care provider. Make sure you discuss any questions you have with your health care provider. Document Released: 08/16/2004 Document Revised: 03/27/2016 Document Reviewed: 11/15/2013 [employer] Interactive Patient Education  2018 [employer]  

## 2018-02-20 LAB — CBC
HCT: 41 % (ref 36.0–46.0)
Hemoglobin: 13.6 g/dL (ref 12.0–15.0)
MCHC: 33.1 g/dL (ref 30.0–36.0)
MCV: 91.1 fl (ref 78.0–100.0)
Platelets: 403 10*3/uL — ABNORMAL HIGH (ref 150.0–400.0)
RBC: 4.5 Mil/uL (ref 3.87–5.11)
RDW: 14.1 % (ref 11.5–15.5)
WBC: 10.6 10*3/uL — ABNORMAL HIGH (ref 4.0–10.5)

## 2018-02-23 ENCOUNTER — Other Ambulatory Visit: Payer: Self-pay

## 2018-02-23 DIAGNOSIS — J4531 Mild persistent asthma with (acute) exacerbation: Secondary | ICD-10-CM

## 2018-02-23 MED ORDER — ALBUTEROL SULFATE HFA 108 (90 BASE) MCG/ACT IN AERS: 1.0000 | INHALATION_SPRAY | RESPIRATORY_TRACT | 2 refills | Status: DC | PRN

## 2018-03-11 ENCOUNTER — Encounter: Payer: BLUE CROSS/BLUE SHIELD | Attending: Family Medicine | Admitting: *Deleted

## 2018-03-11 DIAGNOSIS — E119 Type 2 diabetes mellitus without complications: Secondary | ICD-10-CM | POA: Diagnosis not present

## 2018-03-11 DIAGNOSIS — Z713 Dietary counseling and surveillance: Secondary | ICD-10-CM | POA: Diagnosis not present

## 2018-03-11 NOTE — Progress Notes (Signed)
Diabetes Self-Management Education  Visit Type: First/Initial  Appt. Start Time: 0800 Appt. End Time: 0930  03/11/2018  [name], identified by name and date of birth, is a 62 y.o. female with a diagnosis of Diabetes: Type 2. Patient works as an Airline pilot full time. She states she has a meter but has not used it for some time. Diet history indicates good variety of all food groups, moderate to larger serving sizes. Beverages are all carbohydrate free.  ASSESSMENT  There were no vitals taken for this visit. There is no height or weight on file to calculate BMI.  Diabetes Self-Management Education - 03/11/18 0809      Visit Information   Visit Type  First/Initial      Initial Visit   Diabetes Type  Type 2    Are you currently following a meal plan?  No    Are you taking your medications as prescribed?  Yes    Date Diagnosed  2009      Health Coping   How would you rate your overall health?  Fair      Psychosocial Assessment   Self-care barriers  None    Other persons present  Patient    Patient Concerns  Nutrition/Meal planning;Weight Control;Glycemic Control    Special Needs  None    Learning Readiness  Contemplating    How often do you need to have someone help you when you read instructions, pamphlets, or other written materials from your doctor or pharmacy?  1 - Never    What is the last grade level you completed in school?  12      Pre-Education Assessment   Patient understands the diabetes disease and treatment process.  Needs Instruction    Patient understands incorporating nutritional management into lifestyle.  Needs Instruction    Patient undertands incorporating physical activity into lifestyle.  Needs Instruction    Patient understands using medications safely.  Needs Instruction    Patient understands monitoring blood glucose, interpreting and using results  Needs Instruction    Patient understands prevention, detection, and treatment of acute  complications.  Needs Instruction    Patient understands prevention, detection, and treatment of chronic complications.  Needs Instruction    Patient understands how to develop strategies to address psychosocial issues.  Needs Instruction    Patient understands how to develop strategies to promote health/change behavior.  Needs Instruction      Complications   Last HgB A1C per patient/outside source  6.5 %    How often do you check your blood sugar?  0 times/day (not testing)    Have you had a dilated eye exam in the past 12 months?  Yes    Have you had a dental exam in the past 12 months?  Yes    Are you checking your feet?  No      Dietary Intake   Breakfast  pack of crackers OR biscuit with chicken OR banana nut muffin OR bacon, eggs, toast OR eat out on weekends    Snack (morning)  no    Lunch  if goes home- left overs OR tomato sandwich, if eats out - [employer] meal    Snack (afternoon)  no    Dinner  meat, starch and vegetables, occasionally small salad    Snack (evening)  occasionally ice cream x 2 xcoops    Beverage(s)  coffee with cream and sugar x 1-2 tsp, water, crystal light      Exercise  Exercise Type  ADL's      Patient Education   Previous Diabetes Education  No    Disease state   Definition of diabetes, type 1 and 2, and the diagnosis of diabetes;Factors that contribute to the development of diabetes    Nutrition management   Role of diet in the treatment of diabetes and the relationship between the three main macronutrients and blood glucose level;Carbohydrate counting;Reviewed blood glucose goals for pre and post meals and how to evaluate the patients' food intake on their blood glucose level.;Food label reading, portion sizes and measuring food.    Physical activity and exercise   Role of exercise on diabetes management, blood pressure control and cardiac health.;Helped patient identify appropriate exercises in relation to his/her diabetes, diabetes complications and other  health issue.    Medications  Reviewed patients medication for diabetes, action, purpose, timing of dose and side effects.    Monitoring  Purpose and frequency of SMBG.;Identified appropriate SMBG and/or A1C goals.;Taught/discussed recording of test results and interpretation of SMBG.    Chronic complications  Relationship between chronic complications and blood glucose control    Psychosocial adjustment  Role of stress on diabetes      Individualized Goals (developed by patient)   Nutrition  Follow meal plan discussed    Physical Activity  Exercise 3-5 times per week    Medications  take my medication as prescribed    Monitoring   test blood glucose pre and post meals as discussed      Post-Education Assessment   Patient understands the diabetes disease and treatment process.  Demonstrates understanding / competency    Patient understands incorporating nutritional management into lifestyle.  Demonstrates understanding / competency    Patient undertands incorporating physical activity into lifestyle.  Demonstrates understanding / competency    Patient understands using medications safely.  Demonstrates understanding / competency    Patient understands monitoring blood glucose, interpreting and using results  Demonstrates understanding / competency    Patient understands prevention, detection, and treatment of acute complications.  Demonstrates understanding / competency    Patient understands prevention, detection, and treatment of chronic complications.  Demonstrates understanding / competency    Patient understands how to develop strategies to address psychosocial issues.  Demonstrates understanding / competency    Patient understands how to develop strategies to promote health/change behavior.  Demonstrates understanding / competency      Outcomes   Expected Outcomes  Demonstrated interest in learning. Expect positive outcomes    Future DMSE  PRN    Program Status  Completed        Individualized Plan for Diabetes Self-Management Training:   Learning Objective:  Patient will have a greater understanding of diabetes self-management. Patient education plan is to attend individual and/or group sessions per assessed needs and concerns.   Plan:   Patient Instructions  Plan:  Aim for 2 Carb Choices per meal (30 grams) +/- 1 either way  Aim for 0-2 Carbs per snack if hungry  Include protein in moderation with your meals and snacks Continue reading food labels for Total Carbohydrate of foods Consider  increasing your activity level by walking, arm chair exercises and some water exercises for 15 minutes daily as tolerated Consider checking BG at alternate times per day   Continue taking medication as directed by MD  Expected Outcomes:  Demonstrated interest in learning. Expect positive outcomes  Education material provided: Food label handouts, A1C conversion sheet, Meal plan card and Carbohydrate counting sheet,  Arm Chair exercises  If problems or questions, patient to contact team via:  Phone  Future DSME appointment: PRN

## 2018-03-11 NOTE — Patient Instructions (Signed)
Plan:  Aim for 2 Carb Choices per meal (30 grams) +/- 1 either way  Aim for 0-2 Carbs per snack if hungry  Include protein in moderation with your meals and snacks Continue reading food labels for Total Carbohydrate of foods Consider  increasing your activity level by walking, arm chair exercises and some water exercises for 15 minutes daily as tolerated Consider checking BG at alternate times per day   Continue taking medication as directed by MD

## 2018-05-06 DIAGNOSIS — Z7984 Long term (current) use of oral hypoglycemic drugs: Secondary | ICD-10-CM | POA: Diagnosis not present

## 2018-05-06 DIAGNOSIS — E119 Type 2 diabetes mellitus without complications: Secondary | ICD-10-CM | POA: Diagnosis not present

## 2018-05-06 DIAGNOSIS — H43813 Vitreous degeneration, bilateral: Secondary | ICD-10-CM | POA: Diagnosis not present

## 2018-05-06 DIAGNOSIS — H35431 Paving stone degeneration of retina, right eye: Secondary | ICD-10-CM | POA: Diagnosis not present

## 2018-05-12 DIAGNOSIS — Z23 Encounter for immunization: Secondary | ICD-10-CM | POA: Diagnosis not present

## 2018-06-18 ENCOUNTER — Ambulatory Visit: Payer: BLUE CROSS/BLUE SHIELD | Admitting: Family Medicine

## 2018-06-18 ENCOUNTER — Encounter: Payer: Self-pay | Admitting: Family Medicine

## 2018-06-18 VITALS — BP 120/70 | HR 80 | Ht 62.0 in | Wt 217.2 lb

## 2018-06-18 DIAGNOSIS — Z23 Encounter for immunization: Secondary | ICD-10-CM | POA: Insufficient documentation

## 2018-06-18 DIAGNOSIS — F5102 Adjustment insomnia: Secondary | ICD-10-CM

## 2018-06-18 DIAGNOSIS — L309 Dermatitis, unspecified: Secondary | ICD-10-CM

## 2018-06-18 DIAGNOSIS — L280 Lichen simplex chronicus: Secondary | ICD-10-CM

## 2018-06-18 HISTORY — DX: Lichen simplex chronicus: L28.0

## 2018-06-18 HISTORY — DX: Encounter for immunization: Z23

## 2018-06-18 HISTORY — DX: Dermatitis, unspecified: L30.9

## 2018-06-18 HISTORY — DX: Adjustment insomnia: F51.02

## 2018-06-18 MED ORDER — ALPRAZOLAM 0.5 MG PO TABS: 0.5000 mg | ORAL_TABLET | Freq: Every evening | ORAL | 0 refills | Status: DC | PRN

## 2018-06-18 MED ORDER — TRIAMCINOLONE ACETONIDE 0.5 % EX OINT: TOPICAL_OINTMENT | CUTANEOUS | 1 refills | Status: DC

## 2018-06-18 NOTE — Progress Notes (Addendum)
Subjective:  Patient ID: Kathy [name], female    DOB: 05/10/1956  Age: 62 y.o. MRN: [id number]  CC: rash on right leg   HPI Kathy [name] presents for treatment and evaluation of an intensely pruritic rash on her right shin.  She does find herself itching in the night.  Rash seems to be stable.  The left shin is not affected.  She also has a similar rash on her fingertips.  She has avoided further application of fingernail polish to see if this would help.  Did not polish that is on her finger nails now has been growing out.  The rash persists.  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  Rashes no other place on her body.  She has a history of bronchopneumonia.  She was given a pneumonia vaccine in 2004.  This was repeated again in 2014.  She has not had the Prevnar vaccine.  She does not smoke.  She does have a history of asthmatic bronchitis.  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  She uses Xanax on rare occasion to help her sleep.  Her last prescription of 30 pills lasted her for 3 years.  Blood pressure has been well controlled on diltiazem and lisinopril.  Outpatient Medications Prior to Visit  Medication Sig Dispense Refill  . albuterol (PROVENTIL HFA;VENTOLIN HFA) 108 (90 Base) MCG/ACT inhaler Inhale 1 puff into the lungs every 4 (four) hours as needed for wheezing or shortness of breath. Or cough 1 Inhaler 2  . colesevelam (WELCHOL) 625 MG tablet Take 3 tablets (1,875 mg total) by mouth 2 (two) times daily with a meal. 540 tablet 1  . levocetirizine (XYZAL) 5 MG tablet Take 5 mg by mouth every evening.    . predniSONE (STERAPRED UNI-PAK 48 TAB) 10 MG (48) TBPK tablet Pharm to instruct a 12 day dose pack. 48 tablet 0  . Vitamin D, Ergocalciferol, (DRISDOL) 50000 units CAPS capsule Take 1 capsule (50,000 Units total) by mouth every 7 (seven) days. 5 capsule 5  . ALPRAZolam (XANAX) 0.5 MG tablet Take  0.5 mg by mouth at bedtime as needed for anxiety.     Marland Kitchen. diltiazem (TIAZAC) 360 MG 24 hr capsule Take 1 capsule (360 mg total) by mouth every morning. 90 capsule 1  . lisinopril (PRINIVIL,ZESTRIL) 20 MG tablet Take 1 tablet (20 mg total) by mouth daily. 90 tablet 1  . metFORMIN (GLUCOPHAGE-XR) 500 MG 24 hr tablet Take 2 tablets (1,000 mg total) by mouth at bedtime. 180 tablet 1  . triamterene-hydrochlorothiazide (MAXZIDE-25) 37.5-25 MG tablet Take 1 tablet by mouth daily. 90 tablet 1   No facility-administered medications prior to visit.     ROS Review of Systems  Constitutional: Negative.   HENT: Negative.   Respiratory: Negative.   Cardiovascular: Negative.   Gastrointestinal: Negative.   Skin: Positive for rash. Negative for color change and wound.  Neurological: Negative.   Psychiatric/Behavioral: Negative.     Objective:  BP 120/70 (BP Location: Left Arm, Patient Position: Sitting, Cuff Size: Large)   Pulse 80   Ht 5\' 2"  (1.575 m)   Wt 217 lb 4 oz (98.5 kg)   SpO2 95%   BMI 39.74 kg/m   BP Readings from Last 3 Encounters:  06/18/18 120/70  02/19/18 138/80  02/06/18 126/80    Wt Readings from Last 3 Encounters:  06/18/18 217 lb 4 oz (98.5  kg)  02/06/18 210 lb (95.3 kg)  02/03/18 210 lb 8 oz (95.5 kg)    Physical Exam  Constitutional: She is oriented to person, place, and time. She appears well-developed and well-nourished. No distress.  HENT:  Head: Normocephalic and atraumatic.  Right Ear: External ear normal.  Left Ear: External ear normal.  Mouth/Throat: No oropharyngeal exudate.  Eyes: Conjunctivae are normal. Right eye exhibits no discharge. Left eye exhibits no discharge. No scleral icterus.  Neck: Neck supple. No JVD present. No tracheal deviation present. No thyromegaly present.  Pulmonary/Chest: Effort normal.  Lymphadenopathy:    She has no cervical adenopathy.  Neurological: She is alert and oriented to person, place, and time.  Skin: Skin is warm  and dry. She is not diaphoretic.     Psychiatric: She has a normal mood and affect. Her behavior is normal.    Lab Results  Component Value Date   WBC 10.6 (H) 02/19/2018   HGB 13.6 02/19/2018   HCT 41.0 02/19/2018   PLT 403.0 (H) 02/19/2018   GLUCOSE 124 (H) 02/06/2018   CHOL 215 (H) 02/06/2018   TRIG 112.0 02/06/2018   HDL 43.20 02/06/2018   LDLCALC 150 (H) 02/06/2018   ALT 50 (H) 02/06/2018   AST 38 (H) 02/06/2018   NA 137 02/06/2018   K 4.7 02/06/2018   CL 97 02/06/2018   CREATININE 0.94 02/06/2018   BUN 18 02/06/2018   CO2 30 02/06/2018   TSH 2.02 01/28/2017   INR 1.26 01/16/2016   HGBA1C 6.5 02/06/2018   MICROALBUR <0.7 02/06/2018    No results found.  Assessment & Plan:   Kathy [name] was seen today for rash on right leg.  Diagnoses and all orders for this visit:  Lichen simplex chronicus -     triamcinolone ointment (KENALOG) 0.5 %; Apply to rash on hands and shins sparingly twice daily. Not for face or private areas.  Adjustment insomnia -     Discontinue: ALPRAZolam (XANAX) 0.5 MG tablet; Take 1 tablet (0.5 mg total) by mouth at bedtime as needed for anxiety. -     ALPRAZolam (XANAX) 1 MG tablet; Take 0.5 tablets (0.5 mg total) by mouth at bedtime as needed for sleep.  Need for vaccination against Streptococcus pneumoniae using pneumococcal conjugate vaccine 13 -     Pneumococcal conjugate vaccine 13-valent  Hand dermatitis -     triamcinolone ointment (KENALOG) 0.5 %; Apply to rash on hands and shins sparingly twice daily. Not for face or private areas.   I have discontinued Kathy [name]. [name]'s ALPRAZolam and ALPRAZolam. I am also having her start on triamcinolone ointment and ALPRAZolam. Additionally, I am having her maintain her levocetirizine, colesevelam, Vitamin D (Ergocalciferol), predniSONE, and albuterol.  Meds ordered this encounter  Medications  . DISCONTD: ALPRAZolam (XANAX) 0.5 MG tablet    Sig: Take 1 tablet (0.5 mg total) by mouth at  bedtime as needed for anxiety.    Dispense:  30 tablet    Refill:  0  . triamcinolone ointment (KENALOG) 0.5 %    Sig: Apply to rash on hands and shins sparingly twice daily. Not for face or private areas.    Dispense:  30 g    Refill:  1  . ALPRAZolam (XANAX) 1 MG tablet    Sig: Take 0.5 tablets (0.5 mg total) by mouth at bedtime as needed for sleep.    Dispense:  20 tablet    Refill:  0   Patient will use the triamcinolone ointment on  rash twice daily until cleared.  She will follow-up if it does not improve on the scalp with strong steroid ointment.  She uses Xanax sparingly already.  She is at low risk for dependence.   Follow-up: Return in about 2 months (around 08/18/2018).  [name], MD

## 2018-06-18 NOTE — Patient Instructions (Signed)
Hand Dermatitis  Hand dermatitis is a skin condition that causes small, itchy, raised dots or fluid-filled blisters to form over the palms of the hands. This condition may also be called hand eczema.  What are the causes?  The cause of this condition is not known.  What increases the risk?  This condition is more likely to develop in people who have a history of allergies, such as:   Hay fever.   Allergic asthma.   An allergy to latex.    Chemical exposure, injuries, and environmental irritants can make hand dermatitis worse. Washing your hands too often can remove natural oils, which can dry out the skin and contribute to outbreaks of this condition.  What are the signs or symptoms?  The most common symptom of this condition is intense itchiness. Cracks or grooves (fissures) on the fingers can also develop. Affected areas can be painful, especially areas where large blisters have formed.  How is this diagnosed?  This condition is diagnosed with a medical history and physical exam.  How is this treated?  This condition is treated with medicines, including:   Steroid creams and ointments.   Oral steroid medicines.   Antibiotic medicines. These are prescribed if you have an infection.   Antihistamine medicines. These help to reduce itchiness.    Follow these instructions at home:   Take or apply over-the-counter and prescription medicines only as told by your health care provider.   If you were prescribed an antibiotic medicine, use it as told by your health care provider. Do not stop using the antibiotic even if you start to feel better.   Avoid washing your hands more often than necessary.   Avoid using harsh chemicals on your hands.   Wear protective gloves when you handle products that can irritate your skin.   Keep all follow-up visits as told by your health care provider. This is important.  Contact a health care provider if:   Your rash does not improve during the first week of treatment.   Your  rash is red or tender.   Your rash has pus coming from it.   Your rash spreads.  This information is not intended to replace advice given to you by your health care provider. Make sure you discuss any questions you have with your health care provider.  Document Released: 07/22/2005 Document Revised: 12/28/2015 Document Reviewed: 02/03/2015  [employer]vier Interactive Patient Education  2018 [employer]

## 2018-07-05 ENCOUNTER — Other Ambulatory Visit: Payer: Self-pay | Admitting: Family Medicine

## 2018-07-05 DIAGNOSIS — E119 Type 2 diabetes mellitus without complications: Secondary | ICD-10-CM

## 2018-07-05 DIAGNOSIS — I10 Essential (primary) hypertension: Secondary | ICD-10-CM

## 2018-07-10 ENCOUNTER — Encounter: Payer: Self-pay | Admitting: Family Medicine

## 2018-07-10 DIAGNOSIS — Z1239 Encounter for other screening for malignant neoplasm of breast: Secondary | ICD-10-CM | POA: Diagnosis not present

## 2018-07-10 DIAGNOSIS — Z1231 Encounter for screening mammogram for malignant neoplasm of breast: Secondary | ICD-10-CM | POA: Diagnosis not present

## 2018-07-15 ENCOUNTER — Encounter: Payer: Self-pay | Admitting: Family Medicine

## 2018-07-16 MED ORDER — ALPRAZOLAM 1 MG PO TABS: 0.5000 mg | ORAL_TABLET | Freq: Every evening | ORAL | 0 refills | Status: DC | PRN

## 2018-07-16 NOTE — Addendum Note (Signed)
Addended by: Nadene RubinsKREMER, WILLIAM A on: 07/16/2018 12:09 PM   Modules accepted: Orders

## 2018-09-01 ENCOUNTER — Other Ambulatory Visit: Payer: Self-pay | Admitting: Family Medicine

## 2018-09-01 ENCOUNTER — Encounter: Payer: Self-pay | Admitting: Family Medicine

## 2018-09-01 ENCOUNTER — Ambulatory Visit: Payer: BLUE CROSS/BLUE SHIELD | Admitting: Family Medicine

## 2018-09-01 VITALS — BP 124/70 | HR 101 | Ht 62.0 in | Wt 219.5 lb

## 2018-09-01 DIAGNOSIS — M109 Gout, unspecified: Secondary | ICD-10-CM

## 2018-09-01 HISTORY — DX: Gout, unspecified: M10.9

## 2018-09-01 MED ORDER — COLCHICINE 0.6 MG PO TABS: 0.6000 mg | ORAL_TABLET | Freq: Two times a day (BID) | ORAL | 1 refills | Status: DC

## 2018-09-01 MED ORDER — METHYLPREDNISOLONE SODIUM SUCC 125 MG IJ SOLR
125.0000 mg | Freq: Once | INTRAMUSCULAR | Status: AC
  Administered 2018-09-01: 125 mg via INTRAMUSCULAR

## 2018-09-01 MED ORDER — PREDNISONE 10 MG (21) PO TBPK: ORAL_TABLET | ORAL | 0 refills | Status: DC

## 2018-09-01 NOTE — Patient Instructions (Signed)
Gout  Gout is a condition that causes painful swelling of the joints. Gout is a type of inflammation of the joints (arthritis). This condition is caused by having too much uric acid in the body. Uric acid is a chemical that forms when the body breaks down substances called purines. Purines are important for building body proteins. When the body has too much uric acid, sharp crystals can form and build up inside the joints. This causes pain and swelling. Gout attacks can happen quickly and may be very painful (acute gout). Over time, the attacks can affect more joints and become more frequent (chronic gout). Gout can also cause uric acid to build up under the skin and inside the kidneys. What are the causes? This condition is caused by too much uric acid in your blood. This can happen because:  Your kidneys do not remove enough uric acid from your blood. This is the most common cause.  Your body makes too much uric acid. This can happen with some cancers and cancer treatments. It can also occur if your body is breaking down too many red blood cells (hemolytic anemia).  You eat too many foods that are high in purines. These foods include organ meats and some seafood. Alcohol, especially beer, is also high in purines. A gout attack may be triggered by trauma or stress. What increases the risk? You are more likely to develop this condition if you:  Have a family history of gout.  Are female and middle-aged.  Are female and have gone through menopause.  Are obese.  Frequently drink alcohol, especially beer.  Are dehydrated.  Lose weight too quickly.  Have an organ transplant.  Have lead poisoning.  Take certain medicines, including aspirin, cyclosporine, diuretics, levodopa, and niacin.  Have kidney disease.  Have a skin condition called psoriasis. What are the signs or symptoms? An attack of acute gout happens quickly. It usually occurs in just one joint. The most common place is  the big toe. Attacks often start at night. Other joints that may be affected include joints of the feet, ankle, knee, fingers, wrist, or elbow. Symptoms of this condition may include:  Severe pain.  Warmth.  Swelling.  Stiffness.  Tenderness. The affected joint may be very painful to touch.  Shiny, red, or purple skin.  Chills and fever. Chronic gout may cause symptoms more frequently. More joints may be involved. You may also have white or yellow lumps (tophi) on your hands or feet or in other areas near your joints. How is this diagnosed? This condition is diagnosed based on your symptoms, medical history, and physical exam. You may have tests, such as:  Blood tests to measure uric acid levels.  Removal of joint fluid with a thin needle (aspiration) to look for uric acid crystals.  X-rays to look for joint damage. How is this treated? Treatment for this condition has two phases: treating an acute attack and preventing future attacks. Acute gout treatment may include medicines to reduce pain and swelling, including:  NSAIDs.  Steroids. These are strong anti-inflammatory medicines that can be taken by mouth (orally) or injected into a joint.  Colchicine. This medicine relieves pain and swelling when it is taken soon after an attack. It can be given by mouth or through an IV. Preventive treatment may include:  Daily use of smaller doses of NSAIDs or colchicine.  Use of a medicine that reduces uric acid levels in your blood.  Changes to your diet. You may   need to see a dietitian about what to eat and drink to prevent gout. Follow these instructions at home: During a gout attack   If directed, put ice on the affected area: ? Put ice in a plastic bag. ? Place a towel between your skin and the bag. ? Leave the ice on for 20 minutes, 2-3 times a day.  Raise (elevate) the affected joint above the level of your heart as often as possible.  Rest the joint as much as possible.  If the affected joint is in your leg, you may be given crutches to use.  Follow instructions from your health care provider about eating or drinking restrictions. Avoiding future gout attacks  Follow a low-purine diet as told by your dietitian or health care provider. Avoid foods and drinks that are high in purines, including liver, kidney, anchovies, asparagus, herring, mushrooms, mussels, and beer.  Maintain a healthy weight or lose weight if you are overweight. If you want to lose weight, talk with your health care provider. It is important that you do not lose weight too quickly.  Start or maintain an exercise program as told by your health care provider. Eating and drinking  Drink enough fluids to keep your urine pale yellow.  If you drink alcohol: ? Limit how much you use to:  0-1 drink a day for women.  0-2 drinks a day for men. ? Be aware of how much alcohol is in your drink. In the [place] one drink equals one 12 oz bottle of beer (355 mL) one 5 oz glass of wine (148 mL), or one 1 oz glass of hard liquor (44 mL). General instructions  Take over-the-counter and prescription medicines only as told by your health care provider.  Do not drive or use heavy machinery while taking prescription pain medicine.  Return to your normal activities as told by your health care provider. Ask your health care provider what activities are safe for you.  Keep all follow-up visits as told by your health care provider. This is important. Contact a health care provider if you have:  Another gout attack.  Continuing symptoms of a gout attack after 10 days of treatment.  Side effects from your medicines.  Chills or a fever.  Burning pain when you urinate.  Pain in your lower back or belly. Get help right away if you:  Have severe or uncontrolled pain.  Cannot urinate. Summary  Gout is painful swelling of the joints caused by inflammation.  The most common site of pain is the big  toe, but it can affect other joints in the body.  Medicines and dietary changes can help to prevent and treat gout attacks. This information is not intended to replace advice given to you by your health care provider. Make sure you discuss any questions you have with your health care provider. Document Released: 07/19/2000 Document Revised: 02/11/2018 Document Reviewed: 02/11/2018 Elsevier Interactive Patient Education  2019 [employer]  Low-Purine Eating Plan A low-purine eating plan involves making food choices to limit your intake of purine. Purine is a kind of uric acid. Too much uric acid in your blood can cause certain conditions, such as gout and kidney stones. Eating a low-purine diet can help control these conditions. What are tips for following this plan? Reading food labels   Avoid foods with saturated or Trans fat.  Check the ingredient list of grains-based foods, such as bread and cereal, to make sure that they contain whole grains.  Check  the ingredient list of sauces or soups to make sure they do not contain meat or fish.  When choosing soft drinks, check the ingredient list to make sure they do not contain high-fructose corn syrup. Shopping  Buy plenty of fresh fruits and vegetables.  Avoid buying canned or fresh fish.  Buy dairy products labeled as low-fat or nonfat.  Avoid buying premade or processed foods. These foods are often high in fat, salt (sodium), and added sugar. Cooking  Use olive oil instead of butter when cooking. Oils like olive oil, canola oil, and sunflower oil contain healthy fats. Meal planning  Learn which foods do or do not affect you. If you find out that a food tends to cause your gout symptoms to flare up, avoid eating that food. You can enjoy foods that do not cause problems. If you have any questions about a food item, talk with your dietitian or health care provider.  Limit foods high in fat, especially saturated fat. Fat makes it  harder for your body to get rid of uric acid.  Choose foods that are lower in fat and are lean sources of protein. General guidelines  Limit alcohol intake to no more than 1 drink a day for nonpregnant women and 2 drinks a day for men. One drink equals 12 oz of beer, 5 oz of wine, or 1 oz of hard liquor. Alcohol can affect the way your body gets rid of uric acid.  Drink plenty of water to keep your urine clear or pale yellow. Fluids can help remove uric acid from your body.  If directed by your health care provider, take a vitamin C supplement.  Work with your health care provider and dietitian to develop a plan to achieve or maintain a healthy weight. Losing weight can help reduce uric acid in your blood. What foods are recommended? The items listed may not be a complete list. Talk with your dietitian about what dietary choices are best for you. Foods low in purines Foods low in purines do not need to be limited. These include:  All fruits.  All low-purine vegetables, pickles, and olives.  Breads, pasta, rice, cornbread, and popcorn. Cake and other baked goods.  All dairy foods.  Eggs, nuts, and nut butters.  Spices and condiments, such as salt, herbs, and vinegar.  Plant oils, butter, and margarine.  Water, sugar-free soft drinks, tea, coffee, and cocoa.  Vegetable-based soups, broths, sauces, and gravies. Foods moderate in purines Foods moderate in purines should be limited to the amounts listed.   cup of asparagus, cauliflower, spinach, mushrooms, or green peas, each day.  2/3 cup uncooked oatmeal, each day.   cup dry wheat bran or wheat germ, each day.  2-3 ounces of meat or poultry, each day.  4-6 ounces of shellfish, such as crab, lobster, oysters, or shrimp, each day.  1 cup cooked beans, peas, or lentils, each day.  Soup, broths, or bouillon made from meat or fish. Limit these foods as much as possible. What foods are not recommended? The items listed  may not be a complete list. Talk with your dietitian about what dietary choices are best for you. Limit your intake of foods high in purines, including:  Beer and other alcohol.  Meat-based gravy or sauce.  Canned or fresh fish, such as: ? Anchovies, sardines, herring, and tuna. ? Mussels and scallops. ? Codfish, trout, and haddock.  Kathy [name].  Organ meats, such as: ? Liver or kidney. ? Tripe. ? Sweetbreads (thymus  gland or pancreas).  Wild Education officer, environmentalgame or goose.  Yeast or yeast extract supplements.  Drinks sweetened with high-fructose corn syrup. Summary  Eating a low-purine diet can help control conditions caused by too much uric acid in the body, such as gout or kidney stones.  Choose low-purine foods, limit alcohol, and limit foods high in fat.  You will learn over time which foods do or do not affect you. If you find out that a food tends to cause your gout symptoms to flare up, avoid eating that food. This information is not intended to replace advice given to you by your health care provider. Make sure you discuss any questions you have with your health care provider. Document Released: 11/16/2010 Document Revised: 09/04/2016 Document Reviewed: 09/04/2016 Elsevier Interactive Patient Education  2019 [employer] Allopurinol tablets What is this medicine? ALLOPURINOL (al oh PURE i nole) reduces the amount of uric acid the body makes. It is used to treat the symptoms of gout. It is also used to treat or prevent high uric acid levels that occur as a result of certain types of chemotherapy. This medicine may also help patients who frequently have kidney stones. This medicine may be used for other purposes; ask your health care provider or pharmacist if you have questions. COMMON BRAND NAME(S): Zyloprim What should I tell my health care provider before I take this medicine? They need to know if you have any of these conditions: -kidney disease -liver disease -an unusual or  allergic reaction to allopurinol, other medicines, foods, dyes, or preservatives -pregnant or trying to get pregnant -breast feeding How should I use this medicine? Take this medicine by mouth with a glass of water. Follow the directions on the prescription label. If this medicine upsets your stomach, take it with food or milk. Take your doses at regular intervals. Do not take your medicine more often than directed. Talk to your pediatrician regarding the use of this medicine in children. Special care may be needed. While this drug may be prescribed for children as young as 6 years for selected conditions, precautions do apply. Overdosage: If you think you have taken too much of this medicine contact a poison control center or emergency room at once. NOTE: This medicine is only for you. Do not share this medicine with others. What if I miss a dose? If you miss a dose, take it as soon as you can. If it is almost time for your next dose, take only that dose. Do not take double or extra doses. What may interact with this medicine? Do not take this medicine with the following medication: -didanosine, ddI This medicine may also interact with the following medications: -certain antibiotics like amoxicillin, ampicillin -certain medicines for cancer -certain medicines for immunosuppression like azathioprine, cyclosporine, mercaptopurine -chlorpropamide -probenecid -thiazide diuretics, like hydrochlorothiazide -sulfinpyrazone -warfarin This list may not describe all possible interactions. Give your health care provider a list of all the medicines, herbs, non-prescription drugs, or dietary supplements you use. Also tell them if you smoke, drink alcohol, or use illegal drugs. Some items may interact with your medicine. What should I watch for while using this medicine? Visit your doctor or health care professional for regular checks on your progress. If you are taking this medicine to treat gout, you  may not have less frequent attacks at first. Keep taking your medicine regularly and the attacks should get better within 2 to 6 weeks. Drink plenty of water (10 to 12 full glasses a day) while  you are taking this medicine. This will help to reduce stomach upset and reduce the risk of getting gout or kidney stones. Call your doctor or health care professional at once if you get a skin rash together with chills, fever, sore throat, or nausea and vomiting, if you have blood in your urine, or difficulty passing urine. Do not take vitamin C without asking your doctor or health care professional. Too much vitamin C can increase the chance of getting kidney stones. You may get drowsy or dizzy. Do not drive, use machinery, or do anything that needs mental alertness until you know how this drug affects you. Do not stand or sit up quickly, especially if you are an older patient. This reduces the risk of dizzy or fainting spells. Alcohol can make you more drowsy and dizzy. Alcohol can also increase the chance of stomach problems and increase the amount of uric acid in your blood. Avoid alcoholic drinks. What side effects may I notice from receiving this medicine? Side effects that you should report to your doctor or health care professional as soon as possible: -allergic reactions like skin rash, itching or hives, swelling of the face, lips, or tongue -breathing problems -fever with rash, swollen lymph nodes, or swelling of the face -joint pain -muscle pain -redness, blistering, peeling or loosening of the skin, including inside the mouth -signs and symptoms of infection like fever or chills; cough; sore throat -signs and symptoms of kidney injury like trouble passing urine or change in the amount of urine, flank pain -tingling, numbness in the hands or feet -unusual bleeding or bruising -unusually weak or tired Side effects that usually do not require medical attention (report to your doctor or health care  professional if they continue or are bothersome): -changes in taste -diarrhea -drowsiness -headache -nausea, vomiting -stomach upset This list may not describe all possible side effects. Call your doctor for medical advice about side effects. You may report side effects to FDA at 1-800-FDA-1088. Where should I keep my medicine? Keep out of the reach of children. Store at room temperature between 15 and 25 degrees C (59 and 77 degrees F). Protect from light and moisture. Throw away any unused medicine after the expiration date. NOTE: This sheet is a summary. It may not cover all possible information. If you have questions about this medicine, talk to your doctor, pharmacist, or health care provider.  2019 Elsevier/[employer] Standard (2016-12-24 15:49:12)

## 2018-09-01 NOTE — Progress Notes (Signed)
Established Patient Office Visit  Subjective:  Patient ID: Kathy [name], female    DOB: 1956-04-06  Age: 63 y.o. MRN: [id number]  CC:  Chief Complaint  Patient presents with  . Gout    HPI Kathy Bellowatricia M Devera presents for evaluation and treatment of pain in her left great toe.  She has been treating this with 800 mg of ibuprofen 3 times a day without much relief.  This is her third gout flare in her lifetime.  All 3 of affected her left great toe.  She has taken Indocin in the past with relief but it has caused tinnitus.  She has denied fever or chills injury or nausea or vomiting.  Past Medical History:  Diagnosis Date  . Allergy   . Anxiety   . Arthritis    knees  . H/O seasonal allergies   . History of kidney stones   . Hyperlipidemia   . Hypertension   . Pneumonia   . Renal calculus, right   . Type 2 diabetes mellitus (HCC)   . Wears contact lenses     Past Surgical History:  Procedure Laterality Date  . ABDOMINAL HYSTERECTOMY    . CHOLECYSTECTOMY N/A 01/16/2016   Procedure: LAPAROSCOPIC CHOLECYSTECTOMY WITH INTRAOPERATIVE CHOLANGIOGRAM;  Surgeon: Ovidio Kinavid Newman, MD;  Location: [facility];  Service: General;  Laterality: N/A;  . CYSTOSCOPY WITH RETROGRADE PYELOGRAM, URETEROSCOPY AND STENT PLACEMENT Right 03/07/2016   Procedure: CYSTOSCOPY WITH RETROGRADE PYELOGRAM, URETEROSCOPY AND STENT PLACEMENT ;  Surgeon: Bjorn PippinJohn Wrenn, MD;  [facility] ;  Service: Urology;  Laterality: Right;  . CYSTOSCOPY WITH RETROGRADE PYELOGRAM, URETEROSCOPY AND STENT PLACEMENT Right 03/21/2016   Procedure: CYSTOSCOPY WITH RIGHT RETROGRADE PYELOGRAM, RIGHT URETEROSCOPY WITH LASER  AND STENT PLACEMENT;  Surgeon: Bjorn PippinJohn Wrenn, MD;  Location: [facility];  Service: Urology;  Laterality: Right;  . HOLMIUM LASER APPLICATION N/A 03/21/2016   Procedure: HOLMIUM LASER APPLICATION;  Surgeon: Bjorn PippinJohn Wrenn, MD;  Location: [facility];  Service: Urology;  Laterality: N/A;  . TOTAL ABDOMINAL HYSTERECTOMY W/  BILATERAL SALPINGOOPHORECTOMY  08/1996    Family History  Problem Relation Age of Onset  . Breast cancer Mother   . Aneurysm Father        brain aneurysm  . Kidney Stones Father     Social History   Socioeconomic History  . Marital status: Married    Spouse name: Not on file  . Number of children: Not on file  . Years of education: Not on file  . Highest education level: Not on file  Occupational History  . Not on file  Social Needs  . Financial resource strain: Not on file  . Food insecurity:    Worry: Not on file    Inability: Not on file  . Transportation needs:    Medical: Not on file    Non-medical: Not on file  Tobacco Use  . Smoking status: Never Smoker  . Smokeless tobacco: Never Used  Substance and Sexual Activity  . Alcohol use: Yes    Comment: rare  . Drug use: No  . Sexual activity: Yes    Birth control/protection: Surgical  Lifestyle  . Physical activity:    Days per week: Not on file    Minutes per session: Not on file  . Stress: Not on file  Relationships  . Social connections:    Talks on phone: Not on file    Gets together: Not on file    Attends religious service: Not on file  Active member of club or organization: Not on file    Attends meetings of clubs or organizations: Not on file    Relationship status: Not on file  . Intimate partner violence:    Fear of current or ex partner: Not on file    Emotionally abused: Not on file    Physically abused: Not on file    Forced sexual activity: Not on file  Other Topics Concern  . Not on file  Social History Narrative  . Not on file    Outpatient Medications Prior to Visit  Medication Sig Dispense Refill  . albuterol (PROVENTIL HFA;VENTOLIN HFA) 108 (90 Base) MCG/ACT inhaler Inhale 1 puff into the lungs every 4 (four) hours as needed for wheezing or shortness of breath. Or cough 1 Inhaler 2  . ALPRAZolam (XANAX) 1 MG tablet Take 0.5 tablets (0.5 mg total) by mouth at bedtime as needed for  sleep. 20 tablet 0  . colesevelam (WELCHOL) 625 MG tablet Take 3 tablets (1,875 mg total) by mouth 2 (two) times daily with a meal. 540 tablet 1  . diltiazem (TIAZAC) 360 MG 24 hr capsule TAKE 1 CAPSULE BY MOUTH EVERY MORNING. 90 capsule 1  . levocetirizine (XYZAL) 5 MG tablet Take 5 mg by mouth every evening.    Marland Kitchen lisinopril (PRINIVIL,ZESTRIL) 20 MG tablet TAKE 1 TABLET BY MOUTH EVERY DAY 90 tablet 1  . metFORMIN (GLUCOPHAGE-XR) 500 MG 24 hr tablet TAKE 2 TABLETS .BY MOUTH AT BEDTIME. 180 tablet 1  . triamcinolone ointment (KENALOG) 0.5 % Apply to rash on hands and shins sparingly twice daily. Not for face or private areas. 30 g 1  . triamterene-hydrochlorothiazide (MAXZIDE-25) 37.5-25 MG tablet TAKE 1 TABLET BY MOUTH EVERY DAY 90 tablet 1  . Vitamin D, Ergocalciferol, (DRISDOL) 50000 units CAPS capsule Take 1 capsule (50,000 Units total) by mouth every 7 (seven) days. 5 capsule 5  . predniSONE (STERAPRED UNI-PAK 48 TAB) 10 MG (48) TBPK tablet Pharm to instruct a 12 day dose pack. 48 tablet 0   No facility-administered medications prior to visit.     Allergies  Allergen Reactions  . Statins     Heaviness in legs   . Dexamethasone Palpitations and Rash    Heart races    ROS Review of Systems  Constitutional: Negative for chills, diaphoresis, fatigue, fever and unexpected weight change.  Respiratory: Negative.   Cardiovascular: Negative.   Gastrointestinal: Negative.   Genitourinary: Negative for dysuria.  Musculoskeletal: Positive for arthralgias, gait problem and joint swelling.  Skin: Positive for color change. Negative for wound.  Allergic/Immunologic: Negative for immunocompromised state.  Neurological: Negative for light-headedness and headaches.  Hematological: Does not bruise/bleed easily.  Psychiatric/Behavioral: Negative.       Objective:    Physical Exam  Constitutional: She is oriented to person, place, and time. She appears well-developed and well-nourished. No  distress.  HENT:  Head: Normocephalic and atraumatic.  Right Ear: External ear normal.  Left Ear: External ear normal.  Eyes: Right eye exhibits no discharge. Left eye exhibits no discharge. No scleral icterus.  Neck: No JVD present. No tracheal deviation present.  Pulmonary/Chest: Effort normal. No stridor.  Musculoskeletal:     Left foot: Decreased range of motion. Tenderness, bony tenderness and swelling present. No laceration.       Feet:  Neurological: She is alert and oriented to person, place, and time.  Skin: Skin is warm and dry. She is not diaphoretic. There is erythema.  Psychiatric: She has a  normal mood and affect. Her behavior is normal.    BP 124/70   Pulse (!) 101   Ht 5\' 2"  (1.575 m)   Wt 219 lb 8 oz (99.6 kg)   SpO2 96%   BMI 40.15 kg/m  Wt Readings from Last 3 Encounters:  09/01/18 219 lb 8 oz (99.6 kg)  06/18/18 217 lb 4 oz (98.5 kg)  02/06/18 210 lb (95.3 kg)   BP Readings from Last 3 Encounters:  09/01/18 124/70  06/18/18 120/70  02/19/18 138/80   Guideline developer:  UpToDate (see UpToDate for funding source) Date Released: June 2014  Health Maintenance Due  Topic Date Due  . HIV Screening  01/27/1969  . FOOT EXAM  12/26/2017  . OPHTHALMOLOGY EXAM  04/29/2018  . HEMOGLOBIN A1C  08/09/2018    There are no preventive care reminders to display for this patient.  Lab Results  Component Value Date   TSH 2.02 01/28/2017   Lab Results  Component Value Date   WBC 10.6 (H) 02/19/2018   HGB 13.6 02/19/2018   HCT 41.0 02/19/2018   MCV 91.1 02/19/2018   PLT 403.0 (H) 02/19/2018   Lab Results  Component Value Date   NA 137 02/06/2018   K 4.7 02/06/2018   CO2 30 02/06/2018   GLUCOSE 124 (H) 02/06/2018   BUN 18 02/06/2018   CREATININE 0.94 02/06/2018   BILITOT 0.4 02/06/2018   ALKPHOS 99 02/06/2018   AST 38 (H) 02/06/2018   ALT 50 (H) 02/06/2018   PROT 7.2 02/06/2018   ALBUMIN 4.6 02/06/2018   CALCIUM 10.2 02/06/2018   ANIONGAP 8  03/18/2016   GFR 63.71 02/06/2018   Lab Results  Component Value Date   CHOL 215 (H) 02/06/2018   Lab Results  Component Value Date   HDL 43.20 02/06/2018   Lab Results  Component Value Date   LDLCALC 150 (H) 02/06/2018   Lab Results  Component Value Date   TRIG 112.0 02/06/2018   Lab Results  Component Value Date   CHOLHDL 5 02/06/2018   Lab Results  Component Value Date   HGBA1C 6.5 02/06/2018      Assessment & Plan:   Problem List Items Addressed This Visit      Musculoskeletal and Integument   Acute gout involving toe of right foot - Primary   Relevant Medications   methylPREDNISolone sodium succinate (SOLU-MEDROL) 125 mg/2 mL injection 125 mg (Completed) (Start on 09/01/2018  3:00 PM)   predniSONE (STERAPRED UNI-PAK 21 TAB) 10 MG (21) TBPK tablet   colchicine 0.6 MG tablet      Meds ordered this encounter  Medications  . methylPREDNISolone sodium succinate (SOLU-MEDROL) 125 mg/2 mL injection 125 mg  . predniSONE (STERAPRED UNI-PAK 21 TAB) 10 MG (21) TBPK tablet    Sig: Take 6 today, 5 tomorrow, 4 the next day and then 3, 2, 1 and stop    Dispense:  21 tablet    Refill:  0  . colchicine 0.6 MG tablet    Sig: Take 1 tablet (0.6 mg total) by mouth 2 (two) times daily. For 7-10 days until attack subsides    Dispense:  30 tablet    Refill:  1    Follow-up: Return if symptoms worsen or fail to improve.   Patient will start her prednisone Dosepak tomorrow.  She was given information on gout, low purine diet and allopurinol.  She is aware that prednisone can raise blood sugars and will adjust her diet accordingly.  Patient  is now aware that repeated gouty attacks can cause joint damage.  Will consider therapy with allopurinol with repeated frequent attacks.

## 2018-09-03 ENCOUNTER — Encounter: Payer: Self-pay | Admitting: Family Medicine

## 2018-09-03 MED ORDER — COLCHICINE 0.6 MG PO CAPS: 0.6000 mg | ORAL_CAPSULE | Freq: Two times a day (BID) | ORAL | 1 refills | Status: DC

## 2018-09-03 NOTE — Addendum Note (Signed)
Addended by: Marcell Anger E on: 09/03/2018 11:01 AM   Modules accepted: Orders

## 2018-09-27 ENCOUNTER — Other Ambulatory Visit: Payer: Self-pay | Admitting: Family Medicine

## 2018-09-27 DIAGNOSIS — M109 Gout, unspecified: Secondary | ICD-10-CM

## 2018-10-08 ENCOUNTER — Other Ambulatory Visit: Payer: Self-pay | Admitting: Family Medicine

## 2018-10-08 DIAGNOSIS — E782 Mixed hyperlipidemia: Secondary | ICD-10-CM

## 2018-10-19 ENCOUNTER — Encounter: Payer: Self-pay | Admitting: Family Medicine

## 2018-10-19 MED ORDER — PREDNISONE 20 MG PO TABS: 20.0000 mg | ORAL_TABLET | Freq: Two times a day (BID) | ORAL | 0 refills | Status: AC

## 2018-10-19 MED ORDER — INDOMETHACIN 50 MG PO CAPS: ORAL_CAPSULE | ORAL | 1 refills | Status: DC

## 2018-10-19 NOTE — Addendum Note (Signed)
Addended by: Eulanda Dorion E on: 10/19/2018 10:40 AM   Modules accepted: Orders  

## 2018-10-19 NOTE — Addendum Note (Signed)
Addended by: Weyman Croon E on: 10/19/2018 10:40 AM   Modules accepted: Orders

## 2018-10-25 ENCOUNTER — Other Ambulatory Visit: Payer: Self-pay | Admitting: Family Medicine

## 2018-10-25 DIAGNOSIS — M109 Gout, unspecified: Secondary | ICD-10-CM

## 2018-10-26 ENCOUNTER — Encounter: Payer: Self-pay | Admitting: Family Medicine

## 2018-11-03 ENCOUNTER — Other Ambulatory Visit: Payer: Self-pay

## 2018-11-03 ENCOUNTER — Ambulatory Visit (INDEPENDENT_AMBULATORY_CARE_PROVIDER_SITE_OTHER): Payer: BLUE CROSS/BLUE SHIELD

## 2018-11-03 ENCOUNTER — Encounter: Payer: Self-pay | Admitting: Family Medicine

## 2018-11-03 ENCOUNTER — Ambulatory Visit: Payer: BLUE CROSS/BLUE SHIELD | Admitting: Family Medicine

## 2018-11-03 VITALS — BP 120/70 | HR 97 | Ht 62.0 in | Wt 224.0 lb

## 2018-11-03 DIAGNOSIS — M109 Gout, unspecified: Secondary | ICD-10-CM | POA: Diagnosis not present

## 2018-11-03 DIAGNOSIS — M10072 Idiopathic gout, left ankle and foot: Secondary | ICD-10-CM

## 2018-11-03 DIAGNOSIS — E790 Hyperuricemia without signs of inflammatory arthritis and tophaceous disease: Secondary | ICD-10-CM

## 2018-11-03 DIAGNOSIS — M7989 Other specified soft tissue disorders: Secondary | ICD-10-CM | POA: Diagnosis not present

## 2018-11-03 DIAGNOSIS — E782 Mixed hyperlipidemia: Secondary | ICD-10-CM

## 2018-11-03 DIAGNOSIS — I10 Essential (primary) hypertension: Secondary | ICD-10-CM

## 2018-11-03 MED ORDER — PREDNISONE 10 MG (21) PO TBPK: ORAL_TABLET | ORAL | 0 refills | Status: DC

## 2018-11-03 MED ORDER — COLCHICINE 0.6 MG PO CAPS: 1.0000 | ORAL_CAPSULE | Freq: Two times a day (BID) | ORAL | 0 refills | Status: DC

## 2018-11-03 MED ORDER — ALLOPURINOL 100 MG PO TABS: 100.0000 mg | ORAL_TABLET | Freq: Every day | ORAL | 1 refills | Status: DC

## 2018-11-03 NOTE — Patient Instructions (Signed)
Gout    Gout is a condition that causes painful swelling of the joints. Gout is a type of inflammation of the joints (arthritis). This condition is caused by having too much uric acid in the body. Uric acid is a chemical that forms when the body breaks down substances called purines. Purines are important for building body proteins.  When the body has too much uric acid, sharp crystals can form and build up inside the joints. This causes pain and swelling. Gout attacks can happen quickly and may be very painful (acute gout). Over time, the attacks can affect more joints and become more frequent (chronic gout). Gout can also cause uric acid to build up under the skin and inside the kidneys.  What are the causes?  This condition is caused by too much uric acid in your blood. This can happen because:   Your kidneys do not remove enough uric acid from your blood. This is the most common cause.   Your body makes too much uric acid. This can happen with some cancers and cancer treatments. It can also occur if your body is breaking down too many red blood cells (hemolytic anemia).   You eat too many foods that are high in purines. These foods include organ meats and some seafood. Alcohol, especially beer, is also high in purines.  A gout attack may be triggered by trauma or stress.  What increases the risk?  You are more likely to develop this condition if you:   Have a family history of gout.   Are female and middle-aged.   Are female and have gone through menopause.   Are obese.   Frequently drink alcohol, especially beer.   Are dehydrated.   Lose weight too quickly.   Have an organ transplant.   Have lead poisoning.   Take certain medicines, including aspirin, cyclosporine, diuretics, levodopa, and niacin.   Have kidney disease.   Have a skin condition called psoriasis.  What are the signs or symptoms?  An attack of acute gout happens quickly. It usually occurs in just one joint. The most common place is  the big toe. Attacks often start at night. Other joints that may be affected include joints of the feet, ankle, knee, fingers, wrist, or elbow. Symptoms of this condition may include:   Severe pain.   Warmth.   Swelling.   Stiffness.   Tenderness. The affected joint may be very painful to touch.   Shiny, red, or purple skin.   Chills and fever.  Chronic gout may cause symptoms more frequently. More joints may be involved. You may also have white or yellow lumps (tophi) on your hands or feet or in other areas near your joints.  How is this diagnosed?  This condition is diagnosed based on your symptoms, medical history, and physical exam. You may have tests, such as:   Blood tests to measure uric acid levels.   Removal of joint fluid with a thin needle (aspiration) to look for uric acid crystals.   X-rays to look for joint damage.  How is this treated?  Treatment for this condition has two phases: treating an acute attack and preventing future attacks. Acute gout treatment may include medicines to reduce pain and swelling, including:   NSAIDs.   Steroids. These are strong anti-inflammatory medicines that can be taken by mouth (orally) or injected into a joint.   Colchicine. This medicine relieves pain and swelling when it is taken soon after an attack. It   can be given by mouth or through an IV.  Preventive treatment may include:   Daily use of smaller doses of NSAIDs or colchicine.   Use of a medicine that reduces uric acid levels in your blood.   Changes to your diet. You may need to see a dietitian about what to eat and drink to prevent gout.  Follow these instructions at home:  During a gout attack     If directed, put ice on the affected area:  ? Put ice in a plastic bag.  ? Place a towel between your skin and the bag.  ? Leave the ice on for 20 minutes, 2-3 times a day.   Raise (elevate) the affected joint above the level of your heart as often as possible.   Rest the joint as much as possible.  If the affected joint is in your leg, you may be given crutches to use.   Follow instructions from your health care provider about eating or drinking restrictions.  Avoiding future gout attacks   Follow a low-purine diet as told by your dietitian or health care provider. Avoid foods and drinks that are high in purines, including liver, kidney, anchovies, asparagus, herring, mushrooms, mussels, and beer.   Maintain a healthy weight or lose weight if you are overweight. If you want to lose weight, talk with your health care provider. It is important that you do not lose weight too quickly.   Start or maintain an exercise program as told by your health care provider.  Eating and drinking   Drink enough fluids to keep your urine pale yellow.   If you drink alcohol:  ? Limit how much you use to:   0-1 drink a day for women.   0-2 drinks a day for men.  ? Be aware of how much alcohol is in your drink. In the [place] one drink equals one 12 oz bottle of beer (355 mL) one 5 oz glass of wine (148 mL), or one 1 oz glass of hard liquor (44 mL).  General instructions   Take over-the-counter and prescription medicines only as told by your health care provider.   Do not drive or use heavy machinery while taking prescription pain medicine.   Return to your normal activities as told by your health care provider. Ask your health care provider what activities are safe for you.   Keep all follow-up visits as told by your health care provider. This is important.  Contact a health care provider if you have:   Another gout attack.   Continuing symptoms of a gout attack after 10 days of treatment.   Side effects from your medicines.   Chills or a fever.   Burning pain when you urinate.   Pain in your lower back or belly.  Get help right away if you:   Have severe or uncontrolled pain.   Cannot urinate.  Summary   Gout is painful swelling of the joints caused by inflammation.   The most common site of pain is the big  toe, but it can affect other joints in the body.   Medicines and dietary changes can help to prevent and treat gout attacks.  This information is not intended to replace advice given to you by your health care provider. Make sure you discuss any questions you have with your health care provider.  Document Released: 07/19/2000 Document Revised: 02/11/2018 Document Reviewed: 02/11/2018  [employer] Interactive Patient Education  2019 [employer]

## 2018-11-03 NOTE — Progress Notes (Signed)
Established Patient Office Visit  Subjective:  Patient ID: Kathy [name], female    DOB: [birth year]  Age: 63 y.o. MRN: [id number]  CC:  Chief Complaint  Patient presents with  . Gout    HPI Kathy [name] presents for treatment and evaluation of recurrent gouty flares involving her left first MTP.  She does finished her last prednisone taper this past weekend the flare has returned.  She has a history of an elevated uric acid and 10.8.  She takes colchicine as needed for flares.  Her blood pressure she is well controlled and she is  currently taking diltiazem, lisinopril and Maxide.  She denies that she is taking the [facility] for any specific reason other than for control of her blood pressure.  She does not deal with lower extremity swelling and edema as far she knows.  She is taking the WelChol as directed.  It does cause constipation from time to time and she has to decrease her dosage.  She does not tolerate statins.  Past Medical History:  Diagnosis Date  . Allergy   . Anxiety   . Arthritis    knees  . H/O seasonal allergies   . History of kidney stones   . Hyperlipidemia   . Hypertension   . Pneumonia   . Renal calculus, right   . Type 2 diabetes mellitus (HCC)   . Wears contact lenses     Past Surgical History:  Procedure Laterality Date  . ABDOMINAL HYSTERECTOMY    . CHOLECYSTECTOMY N/A 01/16/2016   Procedure: LAPAROSCOPIC CHOLECYSTECTOMY WITH INTRAOPERATIVE CHOLANGIOGRAM;  Surgeon: Ovidio Kin, MD;  Location: [facility];  Service: General;  Laterality: N/A;  . CYSTOSCOPY WITH RETROGRADE PYELOGRAM, URETEROSCOPY AND STENT PLACEMENT Right 03/07/2016   Procedure: CYSTOSCOPY WITH RETROGRADE PYELOGRAM, URETEROSCOPY AND STENT PLACEMENT ;  Surgeon: Bjorn Pippin, MD;  Location: [facility];  Service: Urology;  Laterality: Right;  . CYSTOSCOPY WITH RETROGRADE PYELOGRAM, URETEROSCOPY AND STENT PLACEMENT Right 03/21/2016   Procedure: CYSTOSCOPY WITH RIGHT RETROGRADE  PYELOGRAM, RIGHT URETEROSCOPY WITH LASER  AND STENT PLACEMENT;  Surgeon: Bjorn Pippin, MD;  Location: [facility];  Service: Urology;  Laterality: Right;  . HOLMIUM LASER APPLICATION N/A 03/21/2016   Procedure: HOLMIUM LASER APPLICATION;  Surgeon: Bjorn Pippin, MD;  Location: [facility];  Service: Urology;  Laterality: N/A;  . TOTAL ABDOMINAL HYSTERECTOMY W/ BILATERAL SALPINGOOPHORECTOMY  08/1996    Family History  Problem Relation Age of Onset  . Breast cancer Mother   . Aneurysm Father        brain aneurysm  . Kidney Stones Father     Social History   Socioeconomic History  . Marital status: Married    Spouse name: Not on file  . Number of children: Not on file  . Years of education: Not on file  . Highest education level: Not on file  Occupational History  . Not on file  Social Needs  . Financial resource strain: Not on file  . Food insecurity:    Worry: Not on file    Inability: Not on file  . Transportation needs:    Medical: Not on file    Non-medical: Not on file  Tobacco Use  . Smoking status: Never Smoker  . Smokeless tobacco: Never Used  Substance and Sexual Activity  . Alcohol use: Yes    Comment: rare  . Drug use: No  . Sexual activity: Yes    Birth control/protection: Surgical  Lifestyle  . Physical activity:  Days per week: Not on file    Minutes per session: Not on file  . Stress: Not on file  Relationships  . Social connections:    Talks on phone: Not on file    Gets together: Not on file    Attends religious service: Not on file    Active member of club or organization: Not on file    Attends meetings of clubs or organizations: Not on file    Relationship status: Not on file  . Intimate partner violence:    Fear of current or ex partner: Not on file    Emotionally abused: Not on file    Physically abused: Not on file    Forced sexual activity: Not on file  Other Topics Concern  . Not on file  Social History Narrative  . Not on file    Outpatient  Medications Prior to Visit  Medication Sig Dispense Refill  . albuterol (PROVENTIL HFA;VENTOLIN HFA) 108 (90 Base) MCG/ACT inhaler Inhale 1 puff into the lungs every 4 (four) hours as needed for wheezing or shortness of breath. Or cough 1 Inhaler 2  . ALPRAZolam (XANAX) 1 MG tablet Take 0.5 tablets (0.5 mg total) by mouth at bedtime as needed for sleep. 20 tablet 0  . diltiazem (TIAZAC) 360 MG 24 hr capsule TAKE 1 CAPSULE BY MOUTH EVERY MORNING. 90 capsule 1  . levocetirizine (XYZAL) 5 MG tablet Take 5 mg by mouth every evening.    Marland Kitchen lisinopril (PRINIVIL,ZESTRIL) 20 MG tablet TAKE 1 TABLET BY MOUTH EVERY DAY 90 tablet 1  . metFORMIN (GLUCOPHAGE-XR) 500 MG 24 hr tablet TAKE 2 TABLETS .BY MOUTH AT BEDTIME. 180 tablet 1  . triamcinolone ointment (KENALOG) 0.5 % Apply to rash on hands and shins sparingly twice daily. Not for face or private areas. 30 g 1  . Vitamin D, Ergocalciferol, (DRISDOL) 50000 units CAPS capsule Take 1 capsule (50,000 Units total) by mouth every 7 (seven) days. 5 capsule 5  . WELCHOL 625 MG tablet TAKE 3 TABLETS BY MOUTH 2 TIMES DAILY WITH A MEAL. 540 tablet 1  . MITIGARE 0.6 MG CAPS TAKE 1 CAP BY MOUTH 2 (TWO) TIMES DAILY BEFORE A MEAL. FOR 10 DAYS WITH FLAIRS. 60 capsule 1  . triamterene-hydrochlorothiazide (MAXZIDE-25) 37.5-25 MG tablet TAKE 1 TABLET BY MOUTH EVERY DAY 90 tablet 1   No facility-administered medications prior to visit.     Allergies  Allergen Reactions  . Indomethacin   . Statins     Heaviness in legs   . Dexamethasone Palpitations and Rash    Heart races    ROS Review of Systems  Constitutional: Negative for diaphoresis, fatigue, fever and unexpected weight change.  HENT: Negative.   Eyes: Negative for photophobia and visual disturbance.  Respiratory: Negative.   Cardiovascular: Negative.   Gastrointestinal: Negative.   Genitourinary: Negative.   Musculoskeletal: Positive for arthralgias and gait problem.  Skin: Negative for pallor and rash.   Allergic/Immunologic: Negative for immunocompromised state.  Neurological: Negative for light-headedness and headaches.  Hematological: Does not bruise/bleed easily.  Psychiatric/Behavioral: Negative.       Objective:    Physical Exam  Constitutional: She is oriented to person, place, and time. She appears well-developed and well-nourished. No distress.  HENT:  Head: Normocephalic and atraumatic.  Right Ear: External ear normal.  Left Ear: External ear normal.  Eyes: Conjunctivae are normal. Right eye exhibits no discharge. Left eye exhibits no discharge. No scleral icterus.  Neck: No JVD present. No  tracheal deviation present.  Pulmonary/Chest: Effort normal. No stridor.  Musculoskeletal:        General: No edema.     Left foot: Tenderness, bony tenderness and swelling present.       Feet:  Neurological: She is alert and oriented to person, place, and time.  Skin: Skin is warm and dry. She is not diaphoretic.  Psychiatric: She has a normal mood and affect. Her behavior is normal.    BP 120/70   Pulse 97   Ht  (1.575 m)   Wt 224 lb (101.6 kg)   SpO2 96%   BMI 40.97 kg/m  Wt Readings from Last 3 Encounters:  11/03/18 224 lb (101.6 kg)  09/01/18 219 lb 8 oz (99.6 kg)  06/18/18 217 lb 4 oz (98.5 kg)   BP Readings from Last 3 Encounters:  11/03/18 120/70  09/01/18 124/70  06/18/18 120/70   Guideline developer:  UpToDate (see UpToDate for funding source) Date Released: June 2014  Health Maintenance Due  Topic Date Due  . HIV Screening  01/27/1969  . FOOT EXAM  12/26/2017  . OPHTHALMOLOGY EXAM  04/29/2018  . HEMOGLOBIN A1C  08/09/2018    There are no preventive care reminders to display for this patient.  Lab Results  Component Value Date   TSH 2.02 01/28/2017   Lab Results  Component Value Date   WBC 10.6 (H) 02/19/2018   HGB 13.6 02/19/2018   HCT 41.0 02/19/2018   MCV 91.1 02/19/2018   PLT 403.0 (H) 02/19/2018   Lab Results  Component Value  Date   NA 137 02/06/2018   K 4.7 02/06/2018   CO2 30 02/06/2018   GLUCOSE 124 (H) 02/06/2018   BUN 18 02/06/2018   CREATININE 0.94 02/06/2018   BILITOT 0.4 02/06/2018   ALKPHOS 99 02/06/2018   AST 38 (H) 02/06/2018   ALT 50 (H) 02/06/2018   PROT 7.2 02/06/2018   ALBUMIN 4.6 02/06/2018   CALCIUM 10.2 02/06/2018   ANIONGAP 8 03/18/2016   GFR 63.71 02/06/2018   Lab Results  Component Value Date   CHOL 215 (H) 02/06/2018   Lab Results  Component Value Date   HDL 43.20 02/06/2018   Lab Results  Component Value Date   LDLCALC 150 (H) 02/06/2018   Lab Results  Component Value Date   TRIG 112.0 02/06/2018   Lab Results  Component Value Date   CHOLHDL 5 02/06/2018   Lab Results  Component Value Date   HGBA1C 6.5 02/06/2018      Assessment & Plan:   Problem List Items Addressed This Visit      Cardiovascular and Mediastinum   Hypertension - Primary     Musculoskeletal and Integument   Acute idiopathic gout involving toe of left foot   Relevant Medications   allopurinol (ZYLOPRIM) 100 MG tablet   Colchicine (MITIGARE) 0.6 MG CAPS   predniSONE (STERAPRED UNI-PAK 21 TAB) 10 MG (21) TBPK tablet   Other Relevant Orders   DG Foot Complete Left   Acute gout involving toe of right foot   Relevant Medications   allopurinol (ZYLOPRIM) 100 MG tablet   Colchicine (MITIGARE) 0.6 MG CAPS   predniSONE (STERAPRED UNI-PAK 21 TAB) 10 MG (21) TBPK tablet     Other   Elevated uric acid in blood   Relevant Medications   allopurinol (ZYLOPRIM) 100 MG tablet      Meds ordered this encounter  Medications  . allopurinol (ZYLOPRIM) 100 MG tablet    Sig:  Take 1 tablet (100 mg total) by mouth daily.    Dispense:  90 tablet    Refill:  1  . Colchicine (MITIGARE) 0.6 MG CAPS    Sig: Take 1 tablet by mouth 2 (two) times daily.    Dispense:  180 capsule    Refill:  0  . predniSONE (STERAPRED UNI-PAK 21 TAB) 10 MG (21) TBPK tablet    Sig: Take 6 today, 5 tomorrow, 4 the next  day and then 3, 2, 1 and stop    Dispense:  21 tablet    Refill:  0    Follow-up: Return in about 3 months (around 02/02/2019), or if symptoms worsen or fail to improve.   We will go ahead and start out allopurinol with daily colchicine twice daily.  Explained the rationale for this.  Patient will discontinue Maxide.  She will check and record her blood pressures.  Told her to expect up to a 10 point jump in her blood pressure off of the Maxide.  There may not be an increase at all.  She will let me know if it goes up further than that.

## 2018-11-04 ENCOUNTER — Telehealth: Payer: Self-pay

## 2018-11-04 MED ORDER — COLCHICINE 0.6 MG PO CAPS: 1.0000 | ORAL_CAPSULE | Freq: Two times a day (BID) | ORAL | 0 refills | Status: DC

## 2018-11-04 NOTE — Addendum Note (Signed)
Addended by: Kathreen Devoid on: 11/04/2018 08:59 AM   Modules accepted: Orders

## 2018-11-04 NOTE — Telephone Encounter (Signed)
Kathy [name]

## 2018-11-04 NOTE — Telephone Encounter (Signed)
Received PA request for Colchicine capsules, insurance will cover Mitigare capsules, okay to switch?

## 2018-11-04 NOTE — Telephone Encounter (Signed)
Rx sent 

## 2018-11-21 ENCOUNTER — Other Ambulatory Visit: Payer: Self-pay | Admitting: Family Medicine

## 2018-11-21 DIAGNOSIS — E559 Vitamin D deficiency, unspecified: Secondary | ICD-10-CM

## 2018-11-24 ENCOUNTER — Other Ambulatory Visit: Payer: Self-pay | Admitting: Family Medicine

## 2018-11-24 DIAGNOSIS — F5102 Adjustment insomnia: Secondary | ICD-10-CM

## 2018-11-24 MED ORDER — ALPRAZOLAM 1 MG PO TABS: 0.5000 mg | ORAL_TABLET | Freq: Every evening | ORAL | 0 refills | Status: DC | PRN

## 2018-12-25 ENCOUNTER — Other Ambulatory Visit: Payer: Self-pay | Admitting: Family Medicine

## 2018-12-25 DIAGNOSIS — I10 Essential (primary) hypertension: Secondary | ICD-10-CM

## 2018-12-29 ENCOUNTER — Other Ambulatory Visit: Payer: Self-pay | Admitting: Family Medicine

## 2018-12-29 DIAGNOSIS — E119 Type 2 diabetes mellitus without complications: Secondary | ICD-10-CM

## 2018-12-29 DIAGNOSIS — I10 Essential (primary) hypertension: Secondary | ICD-10-CM

## 2018-12-30 ENCOUNTER — Other Ambulatory Visit: Payer: Self-pay

## 2018-12-30 DIAGNOSIS — I10 Essential (primary) hypertension: Secondary | ICD-10-CM

## 2018-12-30 MED ORDER — DILTIAZEM HCL ER BEADS 360 MG PO CP24: 360.0000 mg | ORAL_CAPSULE | Freq: Every morning | ORAL | 1 refills | Status: DC

## 2019-01-23 ENCOUNTER — Other Ambulatory Visit: Payer: Self-pay | Admitting: Family Medicine

## 2019-01-23 DIAGNOSIS — I10 Essential (primary) hypertension: Secondary | ICD-10-CM

## 2019-02-18 ENCOUNTER — Telehealth: Payer: Self-pay

## 2019-02-18 NOTE — Telephone Encounter (Signed)

## 2019-02-19 ENCOUNTER — Ambulatory Visit (INDEPENDENT_AMBULATORY_CARE_PROVIDER_SITE_OTHER): Payer: BC Managed Care – PPO | Admitting: Family Medicine

## 2019-02-19 ENCOUNTER — Encounter: Payer: Self-pay | Admitting: Family Medicine

## 2019-02-19 VITALS — BP 130/70 | HR 101 | Ht 62.0 in | Wt 226.4 lb

## 2019-02-19 DIAGNOSIS — E559 Vitamin D deficiency, unspecified: Secondary | ICD-10-CM | POA: Diagnosis not present

## 2019-02-19 DIAGNOSIS — E782 Mixed hyperlipidemia: Secondary | ICD-10-CM | POA: Diagnosis not present

## 2019-02-19 DIAGNOSIS — I10 Essential (primary) hypertension: Secondary | ICD-10-CM

## 2019-02-19 DIAGNOSIS — M10072 Idiopathic gout, left ankle and foot: Secondary | ICD-10-CM | POA: Diagnosis not present

## 2019-02-19 DIAGNOSIS — Z0000 Encounter for general adult medical examination without abnormal findings: Secondary | ICD-10-CM

## 2019-02-19 DIAGNOSIS — E119 Type 2 diabetes mellitus without complications: Secondary | ICD-10-CM | POA: Diagnosis not present

## 2019-02-19 LAB — COMPREHENSIVE METABOLIC PANEL
ALT: 28 U/L (ref 0–35)
AST: 17 U/L (ref 0–37)
Albumin: 4.3 g/dL (ref 3.5–5.2)
Alkaline Phosphatase: 75 U/L (ref 39–117)
BUN: 18 mg/dL (ref 6–23)
CO2: 27 mEq/L (ref 19–32)
Calcium: 9.5 mg/dL (ref 8.4–10.5)
Chloride: 103 mEq/L (ref 96–112)
Creatinine, Ser: 0.79 mg/dL (ref 0.40–1.20)
GFR: 73.03 mL/min (ref >60.00)
Glucose, Bld: 106 mg/dL — ABNORMAL HIGH (ref 70–99)
Potassium: 4.1 mEq/L (ref 3.5–5.1)
Sodium: 139 mEq/L (ref 135–145)
Total Bilirubin: 0.5 mg/dL (ref 0.2–1.2)
Total Protein: 6.6 g/dL (ref 6.0–8.3)

## 2019-02-19 LAB — URINALYSIS, ROUTINE W REFLEX MICROSCOPIC
Bilirubin Urine: NEGATIVE
Hgb urine dipstick: NEGATIVE
Ketones, ur: NEGATIVE
Leukocytes,Ua: NEGATIVE
Nitrite: NEGATIVE
RBC / HPF: NONE SEEN (ref 0–?)
Specific Gravity, Urine: 1.01 (ref 1.000–1.030)
Total Protein, Urine: NEGATIVE
Urine Glucose: NEGATIVE
Urobilinogen, UA: 0.2 (ref 0.0–1.0)
pH: 6 (ref 5.0–8.0)

## 2019-02-19 LAB — CBC
HCT: 41.3 % (ref 36.0–46.0)
Hemoglobin: 13.4 g/dL (ref 12.0–15.0)
MCHC: 32.5 g/dL (ref 30.0–36.0)
MCV: 91.2 fl (ref 78.0–100.0)
Platelets: 328 10*3/uL (ref 150.0–400.0)
RBC: 4.52 Mil/uL (ref 3.87–5.11)
RDW: 13.3 % (ref 11.5–15.5)
WBC: 8.2 10*3/uL (ref 4.0–10.5)

## 2019-02-19 LAB — URIC ACID: Uric Acid, Serum: 5.5 mg/dL (ref 2.4–7.0)

## 2019-02-19 LAB — LIPID PANEL
Cholesterol: 169 mg/dL (ref 0–200)
HDL: 40 mg/dL (ref >39.00)
LDL Cholesterol: 93 mg/dL (ref 0–99)
NonHDL: 128.66
Total CHOL/HDL Ratio: 4
Triglycerides: 177 mg/dL — ABNORMAL HIGH (ref 0.0–149.0)
VLDL: 35.4 mg/dL (ref 0.0–40.0)

## 2019-02-19 LAB — HEMOGLOBIN A1C: Hgb A1c MFr Bld: 6.2 % (ref 4.6–6.5)

## 2019-02-19 LAB — VITAMIN D 25 HYDROXY (VIT D DEFICIENCY, FRACTURES): VITD: 33.45 ng/mL (ref 30.00–100.00)

## 2019-02-19 NOTE — Progress Notes (Signed)
Established Patient Office Visit  Subjective:  Patient ID: Kathy [name], female    DOB: 01-Oct-1955  Age: 63 y.o. MRN: [id number]  CC:  Chief Complaint  Patient presents with  . Annual Exam    HPI Kathy [name] presents for follow-up of her hypertension and elevated cholesterol.  Blood pressure controlled well with diltiazem.  She is tolerating the WelChol and Glucophage for her diabetes.  Continues with allopurinol for her elevated uric acid.  She has not had a gouty attack since starting allopurinol.  She has had a headache on the left side of her head and wonders if it is associated with allopurinol.  York SpanielSaid that it is not a usual side effect from that medication but we will keep an eye on it.  She has had a recent URI that is resolved.  Seeing the dentist next week.  Eye exam and mammogram are scheduled for October.  Continues to work.  Her husband is working as well.  She has 2 grown sons who live in the area.  Past Medical History:  Diagnosis Date  . Allergy   . Anxiety   . Arthritis    knees  . H/O seasonal allergies   . History of kidney stones   . Hyperlipidemia   . Hypertension   . Pneumonia   . Renal calculus, right   . Type 2 diabetes mellitus (HCC)   . Wears contact lenses     Past Surgical History:  Procedure Laterality Date  . ABDOMINAL HYSTERECTOMY    . CHOLECYSTECTOMY N/A 01/16/2016   Procedure: LAPAROSCOPIC CHOLECYSTECTOMY WITH INTRAOPERATIVE CHOLANGIOGRAM;  Surgeon: Ovidio Kinavid Newman, MD;  Location: [facility];  Service: General;  Laterality: N/A;  . CYSTOSCOPY WITH RETROGRADE PYELOGRAM, URETEROSCOPY AND STENT PLACEMENT Right 03/07/2016   Procedure: CYSTOSCOPY WITH RETROGRADE PYELOGRAM, URETEROSCOPY AND STENT PLACEMENT ;  Surgeon: Bjorn PippinJohn Wrenn, MD;  [facility];  Service: Urology;  Laterality: Right;  . CYSTOSCOPY WITH RETROGRADE PYELOGRAM, URETEROSCOPY AND STENT PLACEMENT Right 03/21/2016   Procedure: CYSTOSCOPY WITH RIGHT RETROGRADE  PYELOGRAM, RIGHT URETEROSCOPY WITH LASER  AND STENT PLACEMENT;  Surgeon: Bjorn PippinJohn Wrenn, MD;  Location: [facility];  Service: Urology;  Laterality: Right;  . HOLMIUM LASER APPLICATION N/A 03/21/2016   Procedure: HOLMIUM LASER APPLICATION;  Surgeon: Bjorn PippinJohn Wrenn, MD;  Location: [facility];  Service: Urology;  Laterality: N/A;  . TOTAL ABDOMINAL HYSTERECTOMY W/ BILATERAL SALPINGOOPHORECTOMY  08/1996    Family History  Problem Relation Age of Onset  . Breast cancer Mother   . Aneurysm Father        brain aneurysm  . Kidney Stones Father     Social History   Socioeconomic History  . Marital status: Married    Spouse name: Not on file  . Number of children: Not on file  . Years of education: Not on file  . Highest education level: Not on file  Occupational History  . Not on file  Social Needs  . Financial resource strain: Not on file  . Food insecurity    Worry: Not on file    Inability: Not on file  . Transportation needs    Medical: Not on file    Non-medical: Not on file  Tobacco Use  . Smoking status: Never Smoker  . Smokeless tobacco: Never Used  Substance and Sexual Activity  . Alcohol use: Yes    Comment: rare  . Drug use: No  . Sexual activity: Yes    Birth control/protection: Surgical  Lifestyle  .  Physical activity    Days per week: Not on file    Minutes per session: Not on file  . Stress: Not on file  Relationships  . Social Musicianconnections    Talks on phone: Not on file    Gets together: Not on file    Attends religious service: Not on file    Active member of club or organization: Not on file    Attends meetings of clubs or organizations: Not on file    Relationship status: Not on file  . Intimate partner violence    Fear of current or ex partner: Not on file    Emotionally abused: Not on file    Physically abused: Not on file    Forced sexual activity: Not on file  Other Topics Concern  . Not on file  Social History Narrative  . Not on file    Outpatient  Medications Prior to Visit  Medication Sig Dispense Refill  . albuterol (PROVENTIL HFA;VENTOLIN HFA) 108 (90 Base) MCG/ACT inhaler Inhale 1 puff into the lungs every 4 (four) hours as needed for wheezing or shortness of breath. Or cough 1 Inhaler 2  . allopurinol (ZYLOPRIM) 100 MG tablet Take 1 tablet (100 mg total) by mouth daily. 90 tablet 1  . ALPRAZolam (XANAX) 1 MG tablet Take 0.5 tablets (0.5 mg total) by mouth at bedtime as needed for sleep. 20 tablet 0  . Colchicine (MITIGARE) 0.6 MG CAPS Take 1 capsule by mouth 2 (two) times daily. 180 capsule 0  . diltiazem (TIAZAC) 360 MG 24 hr capsule Take 1 capsule (360 mg total) by mouth every morning. 90 capsule 1  . levocetirizine (XYZAL) 5 MG tablet Take 5 mg by mouth every evening.    Marland Kitchen. lisinopril (ZESTRIL) 20 MG tablet TAKE 1 TABLET BY MOUTH EVERY DAY 90 tablet 0  . metFORMIN (GLUCOPHAGE-XR) 500 MG 24 hr tablet TAKE 2 TABLETS .BY MOUTH AT BEDTIME. 180 tablet 0  . triamcinolone ointment (KENALOG) 0.5 % Apply to rash on hands and shins sparingly twice daily. Not for face or private areas. 30 g 1  . WELCHOL 625 MG tablet TAKE 3 TABLETS BY MOUTH 2 TIMES DAILY WITH A MEAL. 540 tablet 1  . predniSONE (STERAPRED UNI-PAK 21 TAB) 10 MG (21) TBPK tablet Take 6 today, 5 tomorrow, 4 the next day and then 3, 2, 1 and stop 21 tablet 0   No facility-administered medications prior to visit.     Allergies  Allergen Reactions  . Indomethacin   . Statins     Heaviness in legs   . Dexamethasone Palpitations and Rash    Heart races    ROS Review of Systems  Constitutional: Negative.   HENT: Negative.   Eyes: Negative for photophobia and visual disturbance.  Respiratory: Negative.   Cardiovascular: Negative.   Gastrointestinal: Negative.   Endocrine: Negative for polyphagia and polyuria.  Genitourinary: Negative.   Musculoskeletal: Negative.   Skin: Negative for pallor and rash.  Allergic/Immunologic: Negative for immunocompromised state.   Neurological: Positive for headaches. Negative for seizures, speech difficulty, light-headedness and numbness.  Hematological: Does not bruise/bleed easily.  Psychiatric/Behavioral: Negative.       Objective:    Physical Exam  Constitutional: She is oriented to person, place, and time. She appears well-developed and well-nourished. No distress.  HENT:  Head: Normocephalic and atraumatic.  Right Ear: External ear normal.  Left Ear: External ear normal.  Mouth/Throat: Oropharynx is clear and moist. No oropharyngeal exudate.  Eyes: Pupils  are equal, round, and reactive to light. Conjunctivae and EOM are normal. Right eye exhibits no discharge. Left eye exhibits no discharge. No scleral icterus.  Neck: Neck supple. No JVD present. No tracheal deviation present. No thyromegaly present.  Cardiovascular: Normal rate, regular rhythm and normal heart sounds.  Pulmonary/Chest: Effort normal and breath sounds normal. No stridor.  Abdominal: Bowel sounds are normal.  Musculoskeletal:        General: No edema.  Lymphadenopathy:    She has no cervical adenopathy.  Neurological: She is alert and oriented to person, place, and time.  Skin: Skin is warm and dry. She is not diaphoretic.  Psychiatric: She has a normal mood and affect. Her behavior is normal.    BP 130/70   Pulse (!) 101   Ht 5\' 2"  (1.575 m)   Wt 226 lb 6 oz (102.7 kg)   SpO2 96%   BMI 41.40 kg/m  Wt Readings from Last 3 Encounters:  02/19/19 226 lb 6 oz (102.7 kg)  11/03/18 224 lb (101.6 kg)  09/01/18 219 lb 8 oz (99.6 kg)   BP Readings from Last 3 Encounters:  02/19/19 130/70  11/03/18 120/70  09/01/18 124/70   Guideline developer:  UpToDate (see UpToDate for funding source) Date Released: June 2014  Health Maintenance Due  Topic Date Due  . HIV Screening  01/27/1969  . FOOT EXAM  12/26/2017  . OPHTHALMOLOGY EXAM  04/29/2018  . HEMOGLOBIN A1C  08/09/2018  . DEXA SCAN  01/28/2019    There are no preventive  care reminders to display for this patient.  Lab Results  Component Value Date   TSH 2.02 01/28/2017   Lab Results  Component Value Date   WBC 10.6 (H) 02/19/2018   HGB 13.6 02/19/2018   HCT 41.0 02/19/2018   MCV 91.1 02/19/2018   PLT 403.0 (H) 02/19/2018   Lab Results  Component Value Date   NA 137 02/06/2018   K 4.7 02/06/2018   CO2 30 02/06/2018   GLUCOSE 124 (H) 02/06/2018   BUN 18 02/06/2018   CREATININE 0.94 02/06/2018   BILITOT 0.4 02/06/2018   ALKPHOS 99 02/06/2018   AST 38 (H) 02/06/2018   ALT 50 (H) 02/06/2018   PROT 7.2 02/06/2018   ALBUMIN 4.6 02/06/2018   CALCIUM 10.2 02/06/2018   ANIONGAP 8 03/18/2016   GFR 63.71 02/06/2018   Lab Results  Component Value Date   CHOL 215 (H) 02/06/2018   Lab Results  Component Value Date   HDL 43.20 02/06/2018   Lab Results  Component Value Date   LDLCALC 150 (H) 02/06/2018   Lab Results  Component Value Date   TRIG 112.0 02/06/2018   Lab Results  Component Value Date   CHOLHDL 5 02/06/2018   Lab Results  Component Value Date   HGBA1C 6.5 02/06/2018      Assessment & Plan:   Problem List Items Addressed This Visit      Cardiovascular and Mediastinum   Essential hypertension - Primary   Relevant Orders   CBC   Comprehensive metabolic panel   Urinalysis, Routine w reflex microscopic     Endocrine   Diabetes mellitus without complication (HCC)   Relevant Orders   Comprehensive metabolic panel   Hemoglobin A1c     Musculoskeletal and Integument   Acute idiopathic gout involving toe of left foot   Relevant Orders   Uric acid     Other   Mixed hyperlipidemia   Relevant Orders   Comprehensive  metabolic panel   Lipid panel   Vitamin D deficiency   Relevant Orders   VITAMIN D 25 Hydroxy (Vit-D Deficiency, Fractures)    Other Visit Diagnoses    Health care maintenance       Relevant Orders   DG Bone Density      No orders of the defined types were placed in this encounter.    Follow-up: Return in about 6 months (around 08/22/2019).

## 2019-02-19 NOTE — Patient Instructions (Signed)
Allopurinol tablets What is this medicine? ALLOPURINOL (al oh PURE i nole) reduces the amount of uric acid the body makes. It is used to treat the symptoms of gout. It is also used to treat or prevent high uric acid levels that occur as a result of certain types of chemotherapy. This medicine may also help patients who frequently have kidney stones. This medicine may be used for other purposes; ask your health care provider or pharmacist if you have questions. COMMON BRAND NAME(S): Zyloprim What should I tell my health care provider before I take this medicine? They need to know if you have any of these conditions:  kidney disease  liver disease  an unusual or allergic reaction to allopurinol, other medicines, foods, dyes, or preservatives  pregnant or trying to get pregnant  breast feeding How should I use this medicine? Take this medicine by mouth with a glass of water. Follow the directions on the prescription label. If this medicine upsets your stomach, take it with food or milk. Take your doses at regular intervals. Do not take your medicine more often than directed. Talk to your pediatrician regarding the use of this medicine in children. Special care may be needed. While this drug may be prescribed for children as young as 6 years for selected conditions, precautions do apply. Overdosage: If you think you have taken too much of this medicine contact a poison control center or emergency room at once. NOTE: This medicine is only for you. Do not share this medicine with others. What if I miss a dose? If you miss a dose, take it as soon as you can. If it is almost time for your next dose, take only that dose. Do not take double or extra doses. What may interact with this medicine? Do not take this medicine with the following medication:  didanosine, ddI This medicine may also interact with the following medications:  certain antibiotics like amoxicillin, ampicillin  certain  medicines for cancer  certain medicines for immunosuppression like azathioprine, cyclosporine, mercaptopurine  chlorpropamide  probenecid  thiazide diuretics, like hydrochlorothiazide  sulfinpyrazone  warfarin This list may not describe all possible interactions. Give your health care provider a list of all the medicines, herbs, non-prescription drugs, or dietary supplements you use. Also tell them if you smoke, drink alcohol, or use illegal drugs. Some items may interact with your medicine. What should I watch for while using this medicine? Visit your doctor or healthcare provider for regular checks on your progress. If you are taking this medicine to treat gout, you may not have less frequent attacks at first. Keep taking your medicine regularly and the attacks should get better within 2 to 6 weeks. Drink plenty of water (10 to 12 full glasses a day) while you are taking this medicine. This will help to reduce stomach upset and reduce the risk of getting gout or kidney stones. Call your doctor or healthcare provider at once if you get a skin rash together with chills, fever, sore throat, or nausea and vomiting, if you have blood in your urine, or difficulty passing urine. This medicine may cause serious skin reactions. They can happen weeks to months after starting the medicine. Contact your healthcare provider right away if you notice fevers or flu-like symptoms with a rash. The rash may be red or purple and then turn into blisters or peeling of the skin. Or, you might notice a red rash with swelling of the face, lips or lymph nodes in your neck  or under your arms. Do not take vitamin C without asking your doctor or healthcare provider. Too much vitamin C can increase the chance of getting kidney stones. You may get drowsy or dizzy. Do not drive, use machinery, or do anything that needs mental alertness until you know how this drug affects you. Do not stand or sit up quickly, especially if you  are an older patient. This reduces the risk of dizzy or fainting spells. Alcohol can make you more drowsy and dizzy. Alcohol can also increase the chance of stomach problems and increase the amount of uric acid in your blood. Avoid alcoholic drinks. What side effects may I notice from receiving this medicine? Side effects that you should report to your doctor or health care professional as soon as possible:  allergic reactions like skin rash, itching or hives, swelling of the face, lips, or tongue  breathing problems  joint pain  muscle pain  rash, fever, and swollen lymph nodes  redness, blistering, peeling, or loosening of the skin, including inside the mouth  signs and symptoms of infection like fever or chills; cough; sore throat  signs and symptoms of kidney injury like trouble passing urine or change in the amount of urine, flank pain  tingling, numbness in the hands or feet  unusual bleeding or bruising  unusually weak or tired Side effects that usually do not require medical attention (report to your doctor or health care professional if they continue or are bothersome):  changes in taste  diarrhea  drowsiness  headache  nausea, vomiting  stomach upset This list may not describe all possible side effects. Call your doctor for medical advice about side effects. You may report side effects to FDA at 1-800-FDA-1088. Where should I keep my medicine? Keep out of the reach of children. Store at room temperature between 15 and 25 degrees C (59 and 77 degrees F). Protect from light and moisture. Throw away any unused medicine after the expiration date. NOTE: This sheet is a summary. It may not cover all possible information. If you have questions about this medicine, talk to your doctor, pharmacist, or health care provider.  2020 Elsevier/Gold Standard (2018-10-13 09:41:46)

## 2019-02-22 ENCOUNTER — Encounter: Payer: Self-pay | Admitting: Family Medicine

## 2019-03-05 IMAGING — DX DG CHEST 2V
2 series · 2 of 2 positions shown · non-contrast
Comparison: 01/15/2016

CLINICAL DATA: Cough.

EXAM:
CHEST - 2 VIEW

[chest pa]
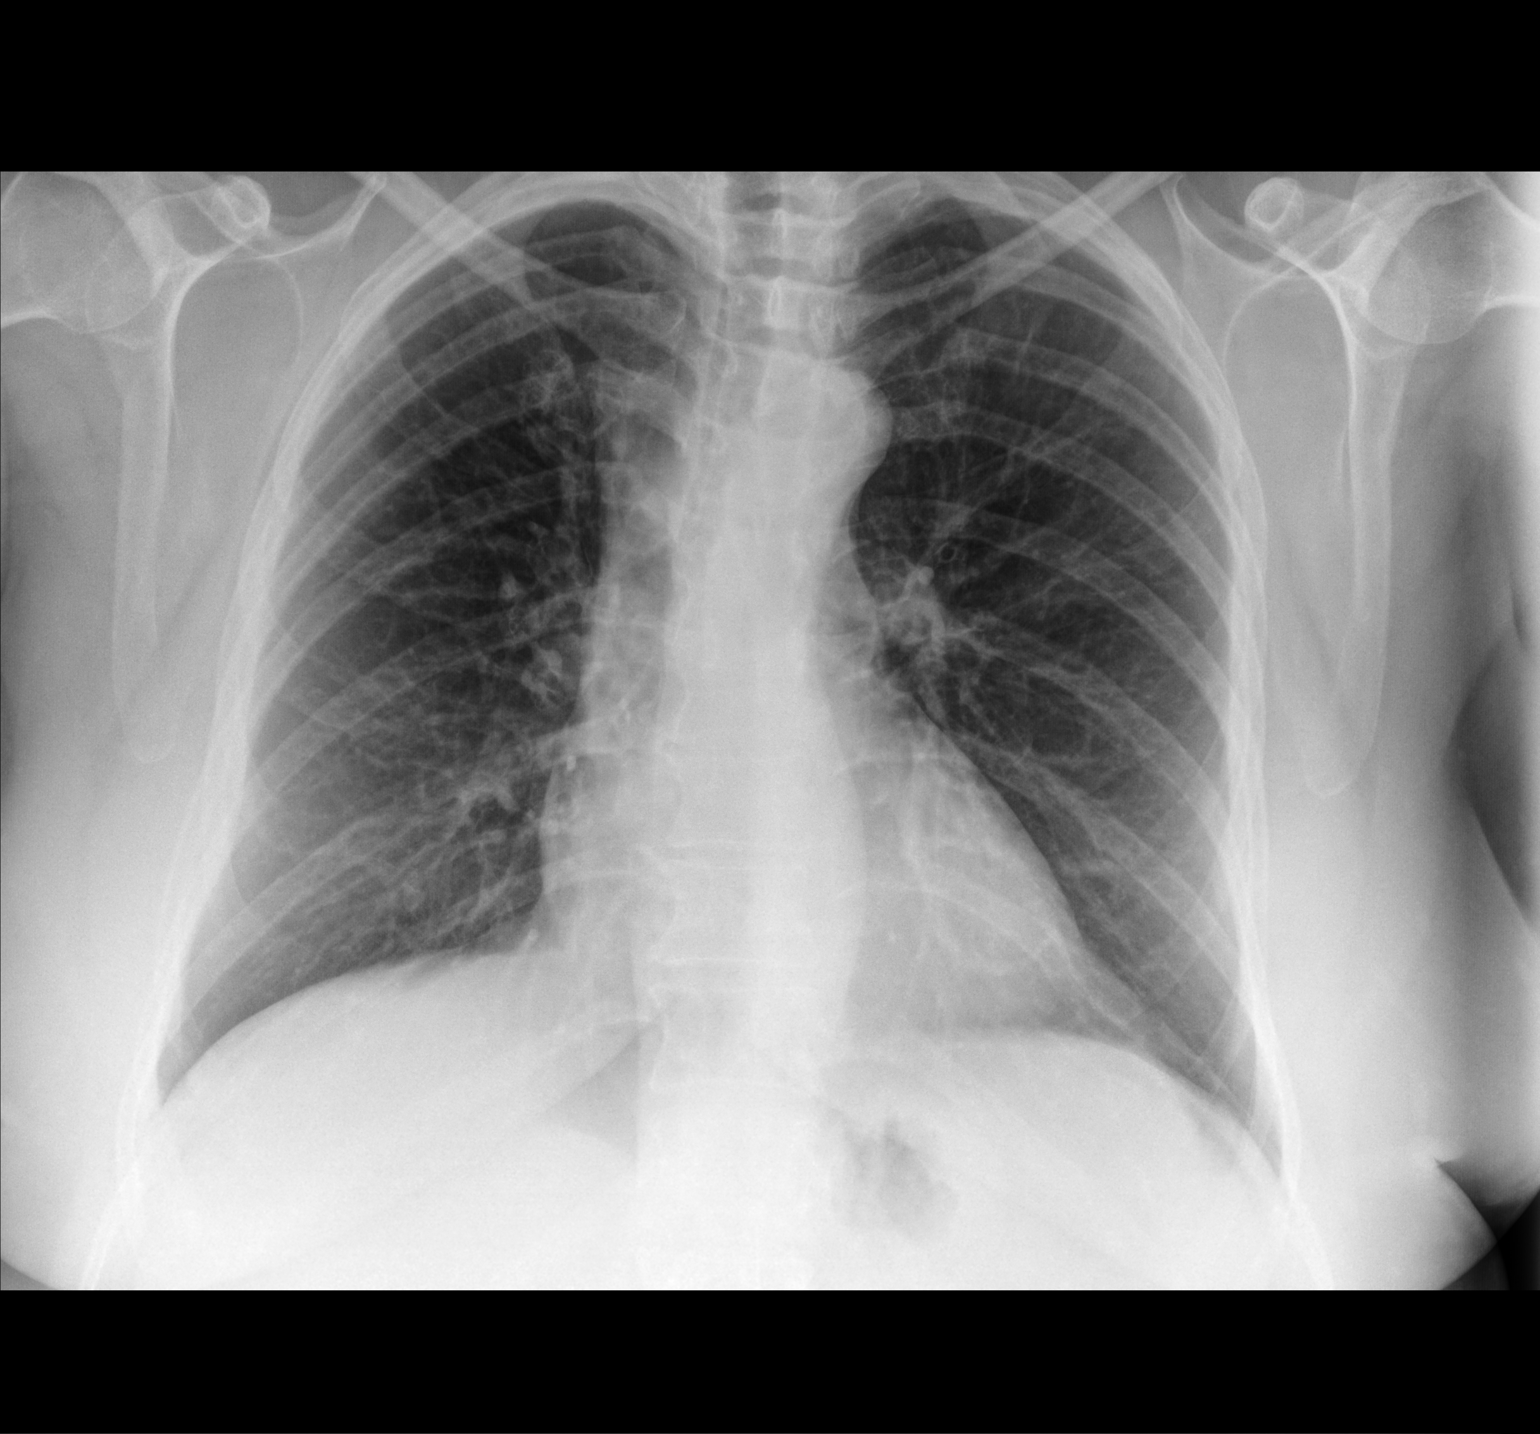

[chest lat]
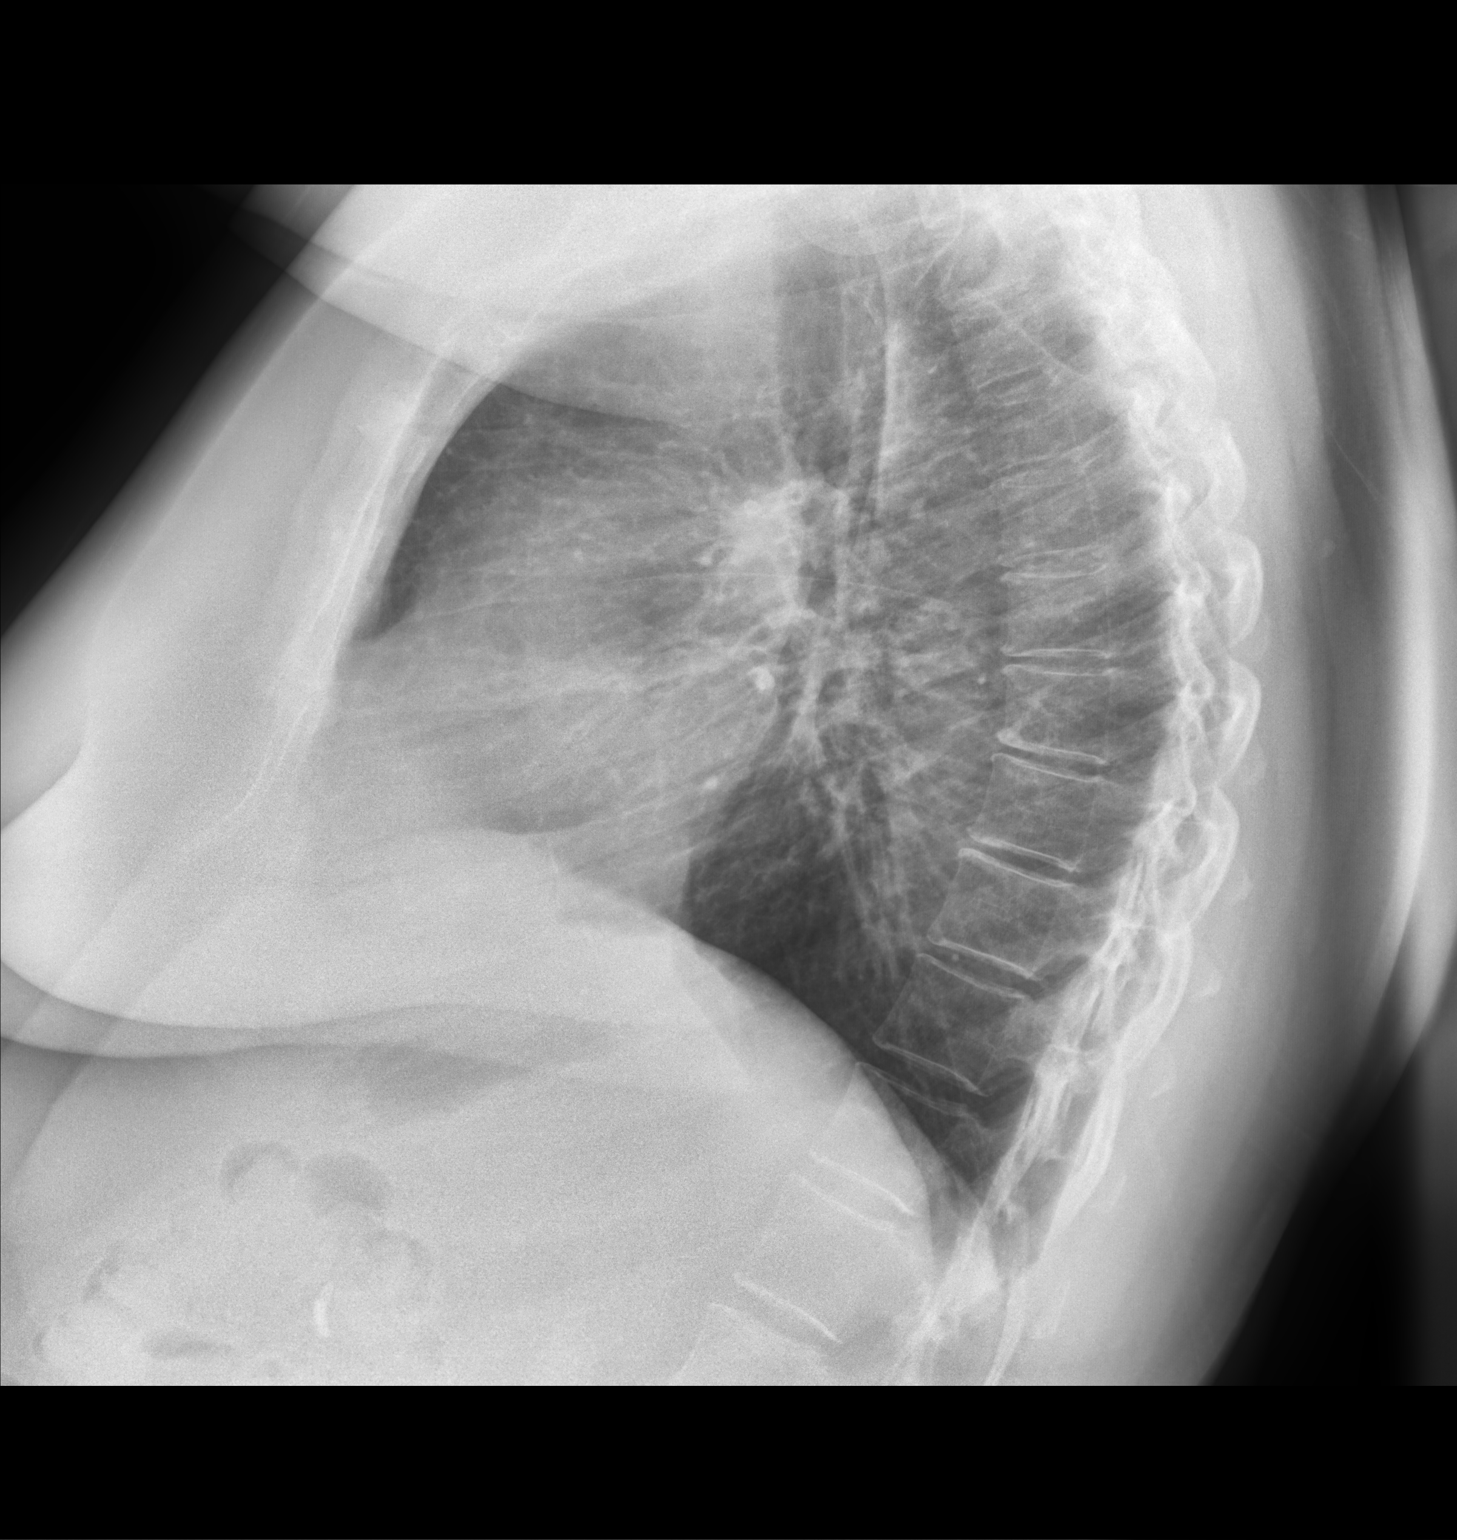

[2 of 2 positions shown; findings below may reference images not displayed]

FINDINGS: The heart size and mediastinal contours are within normal limits.
Both lungs are clear except for slight peribronchial thickening. No
effusions. The visualized skeletal structures are unremarkable.
IMPRESSION: Slight bronchitic changes.  Otherwise, normal exam.

## 2019-03-12 ENCOUNTER — Other Ambulatory Visit: Payer: Self-pay | Admitting: Family Medicine

## 2019-03-12 DIAGNOSIS — E559 Vitamin D deficiency, unspecified: Secondary | ICD-10-CM

## 2019-03-28 ENCOUNTER — Other Ambulatory Visit: Payer: Self-pay | Admitting: Family Medicine

## 2019-03-28 DIAGNOSIS — E119 Type 2 diabetes mellitus without complications: Secondary | ICD-10-CM

## 2019-03-28 DIAGNOSIS — I10 Essential (primary) hypertension: Secondary | ICD-10-CM

## 2019-03-29 ENCOUNTER — Encounter: Payer: Self-pay | Admitting: Family Medicine

## 2019-03-29 ENCOUNTER — Other Ambulatory Visit: Payer: Self-pay | Admitting: Family Medicine

## 2019-03-29 DIAGNOSIS — E119 Type 2 diabetes mellitus without complications: Secondary | ICD-10-CM

## 2019-03-29 DIAGNOSIS — I10 Essential (primary) hypertension: Secondary | ICD-10-CM

## 2019-04-05 ENCOUNTER — Ambulatory Visit: Payer: BC Managed Care – PPO | Admitting: Family Medicine

## 2019-04-05 ENCOUNTER — Encounter: Payer: Self-pay | Admitting: Family Medicine

## 2019-04-05 ENCOUNTER — Other Ambulatory Visit: Payer: Self-pay

## 2019-04-05 ENCOUNTER — Telehealth: Payer: Self-pay

## 2019-04-05 VITALS — BP 138/90 | HR 98 | Ht 62.0 in | Wt 225.0 lb

## 2019-04-05 DIAGNOSIS — M545 Low back pain, unspecified: Secondary | ICD-10-CM

## 2019-04-05 LAB — POCT URINALYSIS DIPSTICK
Bilirubin, UA: NEGATIVE
Blood, UA: POSITIVE
Glucose, UA: NEGATIVE
Ketones, UA: NEGATIVE
Leukocytes, UA: NEGATIVE
Nitrite, UA: NEGATIVE
Protein, UA: NEGATIVE
Spec Grav, UA: 1.015 (ref 1.010–1.025)
Urobilinogen, UA: 0.2 E.U./dL
pH, UA: 6 (ref 5.0–8.0)

## 2019-04-05 MED ORDER — TRAMADOL HCL 50 MG PO TABS: ORAL_TABLET | ORAL | 0 refills | Status: DC

## 2019-04-05 MED ORDER — METHOCARBAMOL 500 MG PO TABS: 500.0000 mg | ORAL_TABLET | Freq: Three times a day (TID) | ORAL | 0 refills | Status: DC | PRN

## 2019-04-05 NOTE — Telephone Encounter (Signed)

## 2019-04-05 NOTE — Progress Notes (Signed)
Established Patient Office Visit  Subjective:  Patient ID: Kathy [name], female    DOB: April 01, 1956  Age: 63 y.o. MRN: [id number]  CC:  Chief Complaint  Patient presents with   Urinary Tract Infection    HPI Kathy Bellowatricia M Popko presents for evaluation and treatment of a 3-day history of lower back pain.  It started to the left but it is since moved across her lower back.  Pain is been intense at times and has come in waves.  She denies dysuria or hematuria but she does have a significant past medical history of renal lithiasis.  Denies any lower back injury.  Denies radiation of pain into her extremities.  There is no radiation of pain into her groin area.  Reports an ongoing history of decreased hip flexion on the left as compared to the right.  She has a history of degenerative disc disease in her lower back.  She has not had a gouty flare since she started the allopurinol.  Past Medical History:  Diagnosis Date   Allergy    Anxiety    Arthritis    knees   H/O seasonal allergies    History of kidney stones    Hyperlipidemia    Hypertension    Pneumonia    Renal calculus, right    Type 2 diabetes mellitus (HCC)    Wears contact lenses     Past Surgical History:  Procedure Laterality Date   ABDOMINAL HYSTERECTOMY     CHOLECYSTECTOMY N/A 01/16/2016   Procedure: LAPAROSCOPIC CHOLECYSTECTOMY WITH INTRAOPERATIVE CHOLANGIOGRAM;  Surgeon: Ovidio Kinavid Newman, MD;  Location: [facility];  Service: General;  Laterality: N/A;   CYSTOSCOPY WITH RETROGRADE PYELOGRAM, URETEROSCOPY AND STENT PLACEMENT Right 03/07/2016   Procedure: CYSTOSCOPY WITH RETROGRADE PYELOGRAM, URETEROSCOPY AND STENT PLACEMENT ;  Surgeon: Bjorn PippinJohn Wrenn, MD;  [facility] [place];  Service: Urology;  Laterality: Right;   CYSTOSCOPY WITH RETROGRADE PYELOGRAM, URETEROSCOPY AND STENT PLACEMENT Right 03/21/2016   Procedure: CYSTOSCOPY WITH RIGHT RETROGRADE PYELOGRAM, RIGHT URETEROSCOPY WITH LASER  AND  STENT PLACEMENT;  Surgeon: Bjorn PippinJohn Wrenn, MD;  Location: [facility];  Service: Urology;  Laterality: Right;   HOLMIUM LASER APPLICATION N/A 03/21/2016   Procedure: HOLMIUM LASER APPLICATION;  Surgeon: Bjorn PippinJohn Wrenn, MD;  Location: [facility];  Service: Urology;  Laterality: N/A;   TOTAL ABDOMINAL HYSTERECTOMY W/ BILATERAL SALPINGOOPHORECTOMY  08/1996    Family History  Problem Relation Age of Onset   Breast cancer Mother    Aneurysm Father        brain aneurysm   Kidney Stones Father     Social History   Socioeconomic History   Marital status: Married    Spouse name: Not on file   Number of children: Not on file   Years of education: Not on file   Highest education level: Not on file  Occupational History   Not on file  Social Needs   Financial resource strain: Not on file   Food insecurity    Worry: Not on file    Inability: Not on file   Transportation needs    Medical: Not on file    Non-medical: Not on file  Tobacco Use   Smoking status: Never Smoker   Smokeless tobacco: Never Used  Substance and Sexual Activity   Alcohol use: Yes    Comment: rare   Drug use: No   Sexual activity: Yes    Birth control/protection: Surgical  Lifestyle   Physical activity    Days per  week: Not on file    Minutes per session: Not on file   Stress: Not on file  Relationships   Social connections    Talks on phone: Not on file    Gets together: Not on file    Attends religious service: Not on file    Active member of club or organization: Not on file    Attends meetings of clubs or organizations: Not on file    Relationship status: Not on file   Intimate partner violence    Fear of current or ex partner: Not on file    Emotionally abused: Not on file    Physically abused: Not on file    Forced sexual activity: Not on file  Other Topics Concern   Not on file  Social History Narrative   Not on file    Outpatient Medications Prior to Visit  Medication Sig Dispense  Refill   albuterol (PROVENTIL HFA;VENTOLIN HFA) 108 (90 Base) MCG/ACT inhaler Inhale 1 puff into the lungs every 4 (four) hours as needed for wheezing or shortness of breath. Or cough 1 Inhaler 2   allopurinol (ZYLOPRIM) 100 MG tablet Take 1 tablet (100 mg total) by mouth daily. 90 tablet 1   ALPRAZolam (XANAX) 1 MG tablet Take 0.5 tablets (0.5 mg total) by mouth at bedtime as needed for sleep. 20 tablet 0   Colchicine (MITIGARE) 0.6 MG CAPS Take 1 capsule by mouth 2 (two) times daily. 180 capsule 0   diltiazem (TIAZAC) 360 MG 24 hr capsule Take 1 capsule (360 mg total) by mouth every morning. 90 capsule 1   levocetirizine (XYZAL) 5 MG tablet Take 5 mg by mouth every evening.     lisinopril (ZESTRIL) 20 MG tablet TAKE 1 TABLET BY MOUTH EVERY DAY 90 tablet 0   metFORMIN (GLUCOPHAGE-XR) 500 MG 24 hr tablet TAKE 2 TABLETS .BY MOUTH AT BEDTIME. 180 tablet 0   triamcinolone ointment (KENALOG) 0.5 % Apply to rash on hands and shins sparingly twice daily. Not for face or private areas. 30 g 1   Vitamin D, Ergocalciferol, (DRISDOL) 1.25 MG (50000 UT) CAPS capsule TAKE 1 CAPSULE (50,000 UNITS TOTAL) BY MOUTH EVERY 7 (SEVEN) DAYS FOR 12 DOSES. 12 capsule 0   WELCHOL 625 MG tablet TAKE 3 TABLETS BY MOUTH 2 TIMES DAILY WITH A MEAL. 540 tablet 1   No facility-administered medications prior to visit.     Allergies  Allergen Reactions   Indomethacin    Statins     Heaviness in legs    Dexamethasone Palpitations and Rash    Heart races    ROS Review of Systems  Constitutional: Negative for diaphoresis, fatigue, fever and unexpected weight change.  HENT: Negative.   Eyes: Negative for photophobia and visual disturbance.  Respiratory: Negative.   Cardiovascular: Negative.   Gastrointestinal: Negative.   Genitourinary: Negative for difficulty urinating, dysuria, flank pain, frequency and hematuria.  Musculoskeletal: Positive for back pain and myalgias.  Skin: Negative for pallor and  rash.  Neurological: Negative for weakness and numbness.  Hematological: Does not bruise/bleed easily.  Psychiatric/Behavioral: Negative.       Objective:    Physical Exam  Constitutional: She is oriented to person, place, and time. She appears well-developed and well-nourished. No distress.  HENT:  Head: Normocephalic and atraumatic.  Right Ear: External ear normal.  Left Ear: External ear normal.  Eyes: Conjunctivae are normal. Right eye exhibits no discharge. Left eye exhibits no discharge. No scleral icterus.  Neck: No  JVD present. No tracheal deviation present.  Pulmonary/Chest: Effort normal. No stridor.  Abdominal: There is no CVA tenderness.  Musculoskeletal:     Right hip: She exhibits normal range of motion, normal strength and no tenderness.     Left hip: She exhibits decreased range of motion. She exhibits normal strength and no tenderness.     Lumbar back: She exhibits decreased range of motion. She exhibits no bony tenderness and no edema.  Neurological: She is alert and oriented to person, place, and time. She has normal strength.  Neg dural tension signs.   Skin: Skin is dry. She is not diaphoretic.  Psychiatric: She has a normal mood and affect. Her behavior is normal.    BP 138/90    Pulse 98    Ht 5\' 2"  (1.575 m)    Wt 225 lb (102.1 kg)    SpO2 95%    BMI 41.15 kg/m  Wt Readings from Last 3 Encounters:  04/05/19 225 lb (102.1 kg)  02/19/19 226 lb 6 oz (102.7 kg)  11/03/18 224 lb (101.6 kg)   BP Readings from Last 3 Encounters:  04/05/19 138/90  02/19/19 130/70  11/03/18 120/70   Guideline developer:  UpToDate (see UpToDate for funding source) Date Released: June 2014  Health Maintenance Due  Topic Date Due   HIV Screening  01/27/1969   FOOT EXAM  12/26/2017   OPHTHALMOLOGY EXAM  04/29/2018   DEXA SCAN  01/28/2019   INFLUENZA VACCINE  03/06/2019    There are no preventive care reminders to display for this patient.  Lab Results  Component  Value Date   TSH 2.02 01/28/2017   Lab Results  Component Value Date   WBC 8.2 02/19/2019   HGB 13.4 02/19/2019   HCT 41.3 02/19/2019   MCV 91.2 02/19/2019   PLT 328.0 02/19/2019   Lab Results  Component Value Date   NA 139 02/19/2019   K 4.1 02/19/2019   CO2 27 02/19/2019   GLUCOSE 106 (H) 02/19/2019   BUN 18 02/19/2019   CREATININE 0.79 02/19/2019   BILITOT 0.5 02/19/2019   ALKPHOS 75 02/19/2019   AST 17 02/19/2019   ALT 28 02/19/2019   PROT 6.6 02/19/2019   ALBUMIN 4.3 02/19/2019   CALCIUM 9.5 02/19/2019   ANIONGAP 8 03/18/2016   GFR 73.03 02/19/2019   Lab Results  Component Value Date   CHOL 169 02/19/2019   Lab Results  Component Value Date   HDL 40.00 02/19/2019   Lab Results  Component Value Date   LDLCALC 93 02/19/2019   Lab Results  Component Value Date   TRIG 177.0 (H) 02/19/2019   Lab Results  Component Value Date   CHOLHDL 4 02/19/2019   Lab Results  Component Value Date   HGBA1C 6.2 02/19/2019      Assessment & Plan:   Problem List Items Addressed This Visit    None    Visit Diagnoses    Low back pain, unspecified back pain laterality, unspecified chronicity, unspecified whether sciatica present    -  Primary   Relevant Medications   traMADol (ULTRAM) 50 MG tablet   methocarbamol (ROBAXIN) 500 MG tablet   Other Relevant Orders   POCT urinalysis dipstick (Completed)   Urinalysis, Routine w reflex microscopic      Meds ordered this encounter  Medications   traMADol (ULTRAM) 50 MG tablet    Sig: Take one at night as needed for pain.    Dispense:  15 tablet  Refill:  0   methocarbamol (ROBAXIN) 500 MG tablet    Sig: Take 1 tablet (500 mg total) by mouth every 8 (eight) hours as needed for muscle spasms.    Dispense:  30 tablet    Refill:  0    Follow-up: Return if symptoms worsen or fail to improve.

## 2019-04-06 LAB — URINALYSIS, ROUTINE W REFLEX MICROSCOPIC
Bilirubin Urine: NEGATIVE
Ketones, ur: NEGATIVE
Leukocytes,Ua: NEGATIVE
Nitrite: NEGATIVE
Specific Gravity, Urine: 1.015 (ref 1.000–1.030)
Total Protein, Urine: NEGATIVE
Urine Glucose: NEGATIVE
Urobilinogen, UA: 0.2 (ref 0.0–1.0)
pH: 6 (ref 5.0–8.0)

## 2019-04-14 ENCOUNTER — Other Ambulatory Visit: Payer: Self-pay | Admitting: Family Medicine

## 2019-04-14 MED ORDER — COLCHICINE 0.6 MG PO CAPS: 1.0000 | ORAL_CAPSULE | Freq: Two times a day (BID) | ORAL | 0 refills | Status: DC

## 2019-04-21 ENCOUNTER — Ambulatory Visit (INDEPENDENT_AMBULATORY_CARE_PROVIDER_SITE_OTHER): Payer: BC Managed Care – PPO | Admitting: Behavioral Health

## 2019-04-21 ENCOUNTER — Other Ambulatory Visit: Payer: Self-pay

## 2019-04-21 DIAGNOSIS — Z23 Encounter for immunization: Secondary | ICD-10-CM

## 2019-04-21 NOTE — Progress Notes (Signed)
Patient came in clinic today for Influenza vaccination. IM injection was given in the right deltoid. Patient tolerated the injection well. No signs or symptoms of a reaction were noted prior to patient leaving the nurse visit.

## 2019-05-06 ENCOUNTER — Ambulatory Visit: Payer: BC Managed Care – PPO | Admitting: Family Medicine

## 2019-05-06 ENCOUNTER — Other Ambulatory Visit: Payer: Self-pay

## 2019-05-06 VITALS — BP 128/100 | HR 83 | Wt 226.6 lb

## 2019-05-06 DIAGNOSIS — M545 Low back pain, unspecified: Secondary | ICD-10-CM

## 2019-05-06 DIAGNOSIS — R399 Unspecified symptoms and signs involving the genitourinary system: Secondary | ICD-10-CM

## 2019-05-06 HISTORY — DX: Low back pain, unspecified: M54.50

## 2019-05-06 HISTORY — DX: Unspecified symptoms and signs involving the genitourinary system: R39.9

## 2019-05-06 LAB — POCT URINALYSIS DIPSTICK
Bilirubin, UA: NEGATIVE
Glucose, UA: NEGATIVE
Ketones, UA: NEGATIVE
Leukocytes, UA: NEGATIVE
Nitrite, UA: NEGATIVE
Protein, UA: NEGATIVE
Spec Grav, UA: 1.015 (ref 1.010–1.025)
Urobilinogen, UA: 0.2 E.U./dL
pH, UA: 6 (ref 5.0–8.0)

## 2019-05-06 MED ORDER — SULFAMETHOXAZOLE-TRIMETHOPRIM 800-160 MG PO TABS: 1.0000 | ORAL_TABLET | Freq: Two times a day (BID) | ORAL | 0 refills | Status: AC

## 2019-05-06 NOTE — Progress Notes (Signed)
Established Patient Office Visit  Subjective:  Patient ID: Kathy [name], female    DOB: May 05, 1956  Age: 63 y.o. MRN: [id number]  CC:  Chief Complaint  Patient presents with  . Urinary Tract Infection    HPI [name] presents for evaluation and treatment of a one-week history of dysuria, increased frequency urgency and decreased emptying.  She is used AZO standard.  Denies vaginal discharge.  Status post total abdominal hysterectomy many years ago.  Lower back pain is improved but it still seems to bother him from time to time.  Distant history of UTI.  Past Medical History:  Diagnosis Date  . Allergy   . Anxiety   . Arthritis    knees  . H/O seasonal allergies   . History of kidney stones   . Hyperlipidemia   . Hypertension   . Pneumonia   . Renal calculus, right   . Type 2 diabetes mellitus ([name])   . Wears contact lenses     Past Surgical History:  Procedure Laterality Date  . ABDOMINAL HYSTERECTOMY    . CHOLECYSTECTOMY N/A 01/16/2016   Procedure: LAPAROSCOPIC CHOLECYSTECTOMY WITH INTRAOPERATIVE CHOLANGIOGRAM;  Surgeon: Alphonsa Overall, MD;  Location: [facility];  Service: General;  Laterality: N/A;  . CYSTOSCOPY WITH RETROGRADE PYELOGRAM, URETEROSCOPY AND STENT PLACEMENT Right 03/07/2016   Procedure: CYSTOSCOPY WITH RETROGRADE PYELOGRAM, URETEROSCOPY AND STENT PLACEMENT ;  Surgeon: Irine Seal, MD;  Location: [facility];  Service: Urology;  Laterality: Right;  . CYSTOSCOPY WITH RETROGRADE PYELOGRAM, URETEROSCOPY AND STENT PLACEMENT Right 03/21/2016   Procedure: CYSTOSCOPY WITH RIGHT RETROGRADE PYELOGRAM, RIGHT URETEROSCOPY WITH LASER  AND STENT PLACEMENT;  Surgeon: Irine Seal, MD;  Location: [facility];  Service: Urology;  Laterality: Right;  . HOLMIUM LASER APPLICATION N/A 09/06/5425   Procedure: HOLMIUM LASER APPLICATION;  Surgeon: Irine Seal, MD;  Location: [facility];  Service: Urology;  Laterality: N/A;  . TOTAL ABDOMINAL HYSTERECTOMY W/ BILATERAL  SALPINGOOPHORECTOMY  08/1996    Family History  Problem Relation Age of Onset  . Breast cancer Mother   . Aneurysm Father        brain aneurysm  . Kidney Stones Father     Social History   Socioeconomic History  . Marital status: Married    Spouse name: Not on file  . Number of children: Not on file  . Years of education: Not on file  . Highest education level: Not on file  Occupational History  . Not on file  Social Needs  . Financial resource strain: Not on file  . Food insecurity    Worry: Not on file    Inability: Not on file  . Transportation needs    Medical: Not on file    Non-medical: Not on file  Tobacco Use  . Smoking status: Never Smoker  . Smokeless tobacco: Never Used  Substance and Sexual Activity  . Alcohol use: Yes    Comment: rare  . Drug use: No  . Sexual activity: Yes    Birth control/protection: Surgical  Lifestyle  . Physical activity    Days per week: Not on file    Minutes per session: Not on file  . Stress: Not on file  Relationships  . Social Herbalist on phone: Not on file    Gets together: Not on file    Attends religious service: Not on file    Active member of club or organization: Not on file    Attends meetings  of clubs or organizations: Not on file    Relationship status: Not on file  . Intimate partner violence    Fear of current or ex partner: Not on file    Emotionally abused: Not on file    Physically abused: Not on file    Forced sexual activity: Not on file  Other Topics Concern  . Not on file  Social History Narrative  . Not on file    Outpatient Medications Prior to Visit  Medication Sig Dispense Refill  . allopurinol (ZYLOPRIM) 100 MG tablet Take 1 tablet (100 mg total) by mouth daily. 90 tablet 1  . ALPRAZolam (XANAX) 1 MG tablet Take 0.5 tablets (0.5 mg total) by mouth at bedtime as needed for sleep. 20 tablet 0  . Colchicine (MITIGARE) 0.6 MG CAPS Take 1 capsule by mouth 2 (two) times daily. 180  capsule 0  . diltiazem (TIAZAC) 360 MG 24 hr capsule Take 1 capsule (360 mg total) by mouth every morning. 90 capsule 1  . levocetirizine (XYZAL) 5 MG tablet Take 5 mg by mouth every evening.    Marland Kitchen lisinopril (ZESTRIL) 20 MG tablet TAKE 1 TABLET BY MOUTH EVERY DAY 90 tablet 0  . metFORMIN (GLUCOPHAGE-XR) 500 MG 24 hr tablet TAKE 2 TABLETS .BY MOUTH AT BEDTIME. 180 tablet 0  . methocarbamol (ROBAXIN) 500 MG tablet Take 1 tablet (500 mg total) by mouth every 8 (eight) hours as needed for muscle spasms. 30 tablet 0  . traMADol (ULTRAM) 50 MG tablet Take one at night as needed for pain. 15 tablet 0  . triamcinolone ointment (KENALOG) 0.5 % Apply to rash on hands and shins sparingly twice daily. Not for face or private areas. 30 g 1  . Vitamin D, Ergocalciferol, (DRISDOL) 1.25 MG (50000 UT) CAPS capsule TAKE 1 CAPSULE (50,000 UNITS TOTAL) BY MOUTH EVERY 7 (SEVEN) DAYS FOR 12 DOSES. 12 capsule 0  . WELCHOL 625 MG tablet TAKE 3 TABLETS BY MOUTH 2 TIMES DAILY WITH A MEAL. 540 tablet 1  . albuterol (PROVENTIL HFA;VENTOLIN HFA) 108 (90 Base) MCG/ACT inhaler Inhale 1 puff into the lungs every 4 (four) hours as needed for wheezing or shortness of breath. Or cough (Patient not taking: Reported on 05/06/2019) 1 Inhaler 2   No facility-administered medications prior to visit.     Allergies  Allergen Reactions  . Indomethacin   . Statins     Heaviness in legs   . Dexamethasone Palpitations and Rash    Heart races    ROS Review of Systems  Constitutional: Negative.   HENT: Negative.   Eyes: Negative for photophobia and visual disturbance.  Respiratory: Negative.   Cardiovascular: Negative.   Gastrointestinal: Negative.   Endocrine: Negative for polyphagia and polyuria.  Genitourinary: Positive for dysuria, frequency and urgency. Negative for vaginal bleeding and vaginal discharge.  Musculoskeletal: Negative for back pain and gait problem.  Skin: Negative for pallor and rash.  Allergic/Immunologic:  Negative for immunocompromised state.  Neurological: Negative for light-headedness and headaches.  Hematological: Does not bruise/bleed easily.  Psychiatric/Behavioral: Negative.       Objective:    Physical Exam  Constitutional: She is oriented to person, place, and time. She appears well-developed and well-nourished. No distress.  HENT:  Head: Normocephalic and atraumatic.  Right Ear: External ear normal.  Left Ear: External ear normal.  Eyes: Conjunctivae are normal. Right eye exhibits no discharge. Left eye exhibits no discharge. No scleral icterus.  Neck: No tracheal deviation present. No thyromegaly present.  Cardiovascular: Normal rate, regular rhythm and normal heart sounds.  Pulmonary/Chest: Breath sounds normal. No stridor.  Abdominal: There is no CVA tenderness.  Lymphadenopathy:    She has no cervical adenopathy.  Neurological: She is alert and oriented to person, place, and time.  Skin: Skin is warm and dry. She is not diaphoretic.  Psychiatric: She has a normal mood and affect.    BP (!) 128/100   Pulse 83   Wt 226 lb 9.6 oz (102.8 kg)   SpO2 94%   BMI 41.45 kg/m  Wt Readings from Last 3 Encounters:  05/06/19 226 lb 9.6 oz (102.8 kg)  04/05/19 225 lb (102.1 kg)  02/19/19 226 lb 6 oz (102.7 kg)     Health Maintenance Due  Topic Date Due  . HIV Screening  01/27/1969  . FOOT EXAM  12/26/2017  . OPHTHALMOLOGY EXAM  04/29/2018  . DEXA SCAN  01/28/2019    There are no preventive care reminders to display for this patient.  Lab Results  Component Value Date   TSH 2.02 01/28/2017   Lab Results  Component Value Date   WBC 8.2 02/19/2019   HGB 13.4 02/19/2019   HCT 41.3 02/19/2019   MCV 91.2 02/19/2019   PLT 328.0 02/19/2019   Lab Results  Component Value Date   NA 139 02/19/2019   K 4.1 02/19/2019   CO2 27 02/19/2019   GLUCOSE 106 (H) 02/19/2019   BUN 18 02/19/2019   CREATININE 0.79 02/19/2019   BILITOT 0.5 02/19/2019   ALKPHOS 75 02/19/2019    AST 17 02/19/2019   ALT 28 02/19/2019   PROT 6.6 02/19/2019   ALBUMIN 4.3 02/19/2019   CALCIUM 9.5 02/19/2019   ANIONGAP 8 03/18/2016   GFR 73.03 02/19/2019   Lab Results  Component Value Date   CHOL 169 02/19/2019   Lab Results  Component Value Date   HDL 40.00 02/19/2019   Lab Results  Component Value Date   LDLCALC 93 02/19/2019   Lab Results  Component Value Date   TRIG 177.0 (H) 02/19/2019   Lab Results  Component Value Date   CHOLHDL 4 02/19/2019   Lab Results  Component Value Date   HGBA1C 6.2 02/19/2019      Assessment & Plan:   Problem List Items Addressed This Visit      Other   Low back pain   UTI symptoms - Primary   Relevant Medications   sulfamethoxazole-trimethoprim (BACTRIM DS) 800-160 MG tablet   Other Relevant Orders   POCT Urinalysis Dipstick (Completed)   Urine Culture   Urinalysis, Routine w reflex microscopic      Meds ordered this encounter  Medications  . sulfamethoxazole-trimethoprim (BACTRIM DS) 800-160 MG tablet    Sig: Take 1 tablet by mouth 2 (two) times daily for 5 days.    Dispense:  10 tablet    Refill:  0    Follow-up: Return if symptoms worsen or fail to improve.  Patient was given information on UTIs and advised to return if persistent symptoms do not respond to the above therapy.   Mliss SaxWilliam Alfred Danniela Mcbrearty, MD

## 2019-05-06 NOTE — Patient Instructions (Signed)

## 2019-05-08 LAB — URINE CULTURE
MICRO NUMBER:: 944032
SPECIMEN QUALITY:: ADEQUATE

## 2019-05-13 ENCOUNTER — Other Ambulatory Visit: Payer: Self-pay

## 2019-05-13 ENCOUNTER — Ambulatory Visit (HOSPITAL_BASED_OUTPATIENT_CLINIC_OR_DEPARTMENT_OTHER)
Admission: RE | Admit: 2019-05-13 | Discharge: 2019-05-13 | Disposition: A | Discharge status: Home / Self Care | Payer: BC Managed Care – PPO | Source: Ambulatory Visit | Attending: Family Medicine | Admitting: Family Medicine

## 2019-05-13 DIAGNOSIS — Z13820 Encounter for screening for osteoporosis: Secondary | ICD-10-CM | POA: Diagnosis not present

## 2019-05-13 DIAGNOSIS — H43813 Vitreous degeneration, bilateral: Secondary | ICD-10-CM | POA: Diagnosis not present

## 2019-05-13 DIAGNOSIS — H5203 Hypermetropia, bilateral: Secondary | ICD-10-CM | POA: Diagnosis not present

## 2019-05-13 DIAGNOSIS — Z780 Asymptomatic menopausal state: Secondary | ICD-10-CM | POA: Diagnosis not present

## 2019-05-13 DIAGNOSIS — Z0000 Encounter for general adult medical examination without abnormal findings: Secondary | ICD-10-CM | POA: Insufficient documentation

## 2019-05-13 DIAGNOSIS — H04123 Dry eye syndrome of bilateral lacrimal glands: Secondary | ICD-10-CM | POA: Diagnosis not present

## 2019-05-13 DIAGNOSIS — Z7984 Long term (current) use of oral hypoglycemic drugs: Secondary | ICD-10-CM | POA: Diagnosis not present

## 2019-05-13 DIAGNOSIS — H524 Presbyopia: Secondary | ICD-10-CM | POA: Diagnosis not present

## 2019-05-13 DIAGNOSIS — H52203 Unspecified astigmatism, bilateral: Secondary | ICD-10-CM | POA: Diagnosis not present

## 2019-05-13 DIAGNOSIS — E119 Type 2 diabetes mellitus without complications: Secondary | ICD-10-CM | POA: Diagnosis not present

## 2019-05-17 ENCOUNTER — Other Ambulatory Visit: Payer: Self-pay | Admitting: Family Medicine

## 2019-05-17 DIAGNOSIS — E790 Hyperuricemia without signs of inflammatory arthritis and tophaceous disease: Secondary | ICD-10-CM

## 2019-05-17 DIAGNOSIS — M10072 Idiopathic gout, left ankle and foot: Secondary | ICD-10-CM

## 2019-05-19 ENCOUNTER — Other Ambulatory Visit (HOSPITAL_BASED_OUTPATIENT_CLINIC_OR_DEPARTMENT_OTHER): Payer: BC Managed Care – PPO

## 2019-06-10 ENCOUNTER — Encounter: Payer: Self-pay | Admitting: Family Medicine

## 2019-06-10 ENCOUNTER — Other Ambulatory Visit: Payer: Self-pay | Admitting: Family Medicine

## 2019-06-10 DIAGNOSIS — E559 Vitamin D deficiency, unspecified: Secondary | ICD-10-CM

## 2019-06-10 DIAGNOSIS — F5102 Adjustment insomnia: Secondary | ICD-10-CM

## 2019-06-11 MED ORDER — VITAMIN D (ERGOCALCIFEROL) 1.25 MG (50000 UNIT) PO CAPS: 50000.0000 [IU] | ORAL_CAPSULE | ORAL | 2 refills | Status: DC

## 2019-06-11 MED ORDER — ALPRAZOLAM 1 MG PO TABS: 0.5000 mg | ORAL_TABLET | Freq: Every evening | ORAL | 0 refills | Status: DC | PRN

## 2019-06-26 ENCOUNTER — Other Ambulatory Visit: Payer: Self-pay | Admitting: Family Medicine

## 2019-06-26 DIAGNOSIS — E119 Type 2 diabetes mellitus without complications: Secondary | ICD-10-CM

## 2019-06-26 DIAGNOSIS — I10 Essential (primary) hypertension: Secondary | ICD-10-CM

## 2019-07-07 ENCOUNTER — Other Ambulatory Visit: Payer: Self-pay

## 2019-07-08 ENCOUNTER — Other Ambulatory Visit: Payer: Self-pay

## 2019-07-08 ENCOUNTER — Encounter: Payer: Self-pay | Admitting: Family Medicine

## 2019-07-08 ENCOUNTER — Ambulatory Visit (INDEPENDENT_AMBULATORY_CARE_PROVIDER_SITE_OTHER): Payer: BC Managed Care – PPO | Admitting: Family Medicine

## 2019-07-08 VITALS — BP 130/78 | HR 88 | Ht 62.0 in | Wt 227.0 lb

## 2019-07-08 DIAGNOSIS — E790 Hyperuricemia without signs of inflammatory arthritis and tophaceous disease: Secondary | ICD-10-CM | POA: Diagnosis not present

## 2019-07-08 DIAGNOSIS — M25561 Pain in right knee: Secondary | ICD-10-CM | POA: Diagnosis not present

## 2019-07-08 DIAGNOSIS — E782 Mixed hyperlipidemia: Secondary | ICD-10-CM | POA: Diagnosis not present

## 2019-07-08 MED ORDER — DICLOFENAC SODIUM 75 MG PO TBEC: 75.0000 mg | DELAYED_RELEASE_TABLET | Freq: Two times a day (BID) | ORAL | 0 refills | Status: DC

## 2019-07-08 NOTE — Progress Notes (Signed)
Established Patient Office Visit  Subjective:  Patient ID: Kathy [name], female    DOB: [birth year]  Age: 63 y.o. MRN: [id number]  CC:  Chief Complaint  Patient presents with  . Knee Pain  . Hip Pain    HPI Kathy [name] presents for ongoing pain in her right knee.  Knee has felt "loose" at times but denies specific locking or giving way.  Injury with a fall in a parking lot about 6 years ago.  It is not bothering her since that time until relatively recently.  Films taken back in 2019 showed preserved joint space in the medial and lateral compartments.  Topical Voltaren gel is not helping.  Pharmacy has been sent advisory that patient had not been filling her WelChol.  She is taking it but at the lower dose.  Cholesterol is been well controlled with the lower dose.  She is taking her allopurinol and she has not had a gouty attack since starting it.  Past Medical History:  Diagnosis Date  . Allergy   . Anxiety   . Arthritis    knees  . H/O seasonal allergies   . History of kidney stones   . Hyperlipidemia   . Hypertension   . Pneumonia   . Renal calculus, right   . Type 2 diabetes mellitus ([facility])   . Wears contact lenses     Past Surgical History:  Procedure Laterality Date  . ABDOMINAL HYSTERECTOMY    . CHOLECYSTECTOMY N/A 01/16/2016   Procedure: LAPAROSCOPIC CHOLECYSTECTOMY WITH INTRAOPERATIVE CHOLANGIOGRAM;  Surgeon: Ovidio Kin, MD;  Location: [facility];  Service: General;  Laterality: N/A;  . CYSTOSCOPY WITH RETROGRADE PYELOGRAM, URETEROSCOPY AND STENT PLACEMENT Right 03/07/2016   Procedure: CYSTOSCOPY WITH RETROGRADE PYELOGRAM, URETEROSCOPY AND STENT PLACEMENT ;  Surgeon: Bjorn Pippin, MD;  Location: [facility];  Service: Urology;  Laterality: Right;  . CYSTOSCOPY WITH RETROGRADE PYELOGRAM, URETEROSCOPY AND STENT PLACEMENT Right 03/21/2016   Procedure: CYSTOSCOPY WITH RIGHT RETROGRADE PYELOGRAM, RIGHT URETEROSCOPY WITH LASER  AND STENT PLACEMENT;   Surgeon: Bjorn Pippin, MD;  Location: [facility];  Service: Urology;  Laterality: Right;  . HOLMIUM LASER APPLICATION N/A 03/21/2016   Procedure: HOLMIUM LASER APPLICATION;  Surgeon: Bjorn Pippin, MD;  Location: [facility];  Service: Urology;  Laterality: N/A;  . TOTAL ABDOMINAL HYSTERECTOMY W/ BILATERAL SALPINGOOPHORECTOMY  08/1996    Family History  Problem Relation Age of Onset  . Breast cancer Mother   . Aneurysm Father        brain aneurysm  . Kidney Stones Father     Social History   Socioeconomic History  . Marital status: Married    Spouse name: Not on file  . Number of children: Not on file  . Years of education: Not on file  . Highest education level: Not on file  Occupational History  . Not on file  Social Needs  . Financial resource strain: Not on file  . Food insecurity    Worry: Not on file    Inability: Not on file  . Transportation needs    Medical: Not on file    Non-medical: Not on file  Tobacco Use  . Smoking status: Never Smoker  . Smokeless tobacco: Never Used  Substance and Sexual Activity  . Alcohol use: Yes    Comment: rare  . Drug use: No  . Sexual activity: Yes    Birth control/protection: Surgical  Lifestyle  . Physical activity    Days per week: Not  on file    Minutes per session: Not on file  . Stress: Not on file  Relationships  . Social Musicianconnections    Talks on phone: Not on file    Gets together: Not on file    Attends religious service: Not on file    Active member of club or organization: Not on file    Attends meetings of clubs or organizations: Not on file    Relationship status: Not on file  . Intimate partner violence    Fear of current or ex partner: Not on file    Emotionally abused: Not on file    Physically abused: Not on file    Forced sexual activity: Not on file  Other Topics Concern  . Not on file  Social History Narrative  . Not on file    Outpatient Medications Prior to Visit  Medication Sig Dispense Refill  .  allopurinol (ZYLOPRIM) 100 MG tablet TAKE 1 TABLET BY MOUTH EVERY DAY 90 tablet 3  . ALPRAZolam (XANAX) 1 MG tablet Take 0.5 tablets (0.5 mg total) by mouth at bedtime as needed for sleep. 20 tablet 0  . Colchicine (MITIGARE) 0.6 MG CAPS Take 1 capsule by mouth 2 (two) times daily. 180 capsule 0  . diltiazem (TIAZAC) 360 MG 24 hr capsule Take 1 capsule (360 mg total) by mouth every morning. 90 capsule 1  . levocetirizine (XYZAL) 5 MG tablet Take 5 mg by mouth every evening.    Marland Kitchen. lisinopril (ZESTRIL) 20 MG tablet TAKE 1 TABLET BY MOUTH EVERY DAY 90 tablet 1  . metFORMIN (GLUCOPHAGE-XR) 500 MG 24 hr tablet TAKE 2 TABLETS .BY MOUTH AT BEDTIME. 180 tablet 1  . methocarbamol (ROBAXIN) 500 MG tablet Take 1 tablet (500 mg total) by mouth every 8 (eight) hours as needed for muscle spasms. 30 tablet 0  . traMADol (ULTRAM) 50 MG tablet Take one at night as needed for pain. 15 tablet 0  . triamcinolone ointment (KENALOG) 0.5 % Apply to rash on hands and shins sparingly twice daily. Not for face or private areas. 30 g 1  . Vitamin D, Ergocalciferol, (DRISDOL) 1.25 MG (50000 UT) CAPS capsule Take 1 capsule (50,000 Units total) by mouth every 7 (seven) days. 5 capsule 2  . WELCHOL 625 MG tablet TAKE 3 TABLETS BY MOUTH 2 TIMES DAILY WITH A MEAL. 540 tablet 1  . albuterol (PROVENTIL HFA;VENTOLIN HFA) 108 (90 Base) MCG/ACT inhaler Inhale 1 puff into the lungs every 4 (four) hours as needed for wheezing or shortness of breath. Or cough (Patient not taking: Reported on 05/06/2019) 1 Inhaler 2   No facility-administered medications prior to visit.     Allergies  Allergen Reactions  . Indomethacin   . Statins     Heaviness in legs   . Dexamethasone Palpitations and Rash    Heart races    ROS Review of Systems  Constitutional: Negative.   HENT: Negative.   Eyes: Negative for photophobia and visual disturbance.  Respiratory: Negative.   Cardiovascular: Negative.   Gastrointestinal: Negative.    Genitourinary: Negative.   Musculoskeletal: Positive for arthralgias, back pain and myalgias.  Skin: Negative for pallor and rash.  Neurological: Negative for weakness and numbness.  Hematological: Negative.   Psychiatric/Behavioral: Negative.       Objective:    Physical Exam  Constitutional: She is oriented to person, place, and time. She appears well-developed and well-nourished. No distress.  HENT:  Head: Normocephalic and atraumatic.  Right Ear: External ear  normal.  Left Ear: External ear normal.  Eyes: Right eye exhibits no discharge. Left eye exhibits no discharge. No scleral icterus.  Pulmonary/Chest: Effort normal.  Musculoskeletal:     Right hip: She exhibits normal range of motion.     Left hip: She exhibits normal range of motion.     Right knee: She exhibits decreased range of motion. She exhibits no swelling and no effusion. Tenderness found. Medial joint line tenderness noted.  Neurological: She is alert and oriented to person, place, and time.  Skin: Skin is warm and dry. She is not diaphoretic.  Psychiatric: She has a normal mood and affect. Her behavior is normal.    BP 130/78   Pulse 88   Ht 5\' 2"  (1.575 m)   Wt 227 lb (103 kg)   SpO2 96%   BMI 41.52 kg/m  Wt Readings from Last 3 Encounters:  07/08/19 227 lb (103 kg)  05/06/19 226 lb 9.6 oz (102.8 kg)  04/05/19 225 lb (102.1 kg)   BP Readings from Last 3 Encounters:  07/08/19 130/78  05/06/19 (!) 128/100  04/05/19 138/90   Guideline developer:  UpToDate (see UpToDate for funding source) Date Released: June 2014  Health Maintenance Due  Topic Date Due  . HIV Screening  01/27/1969  . FOOT EXAM  12/26/2017  . OPHTHALMOLOGY EXAM  04/29/2018    There are no preventive care reminders to display for this patient.  Lab Results  Component Value Date   TSH 2.02 01/28/2017   Lab Results  Component Value Date   WBC 8.2 02/19/2019   HGB 13.4 02/19/2019   HCT 41.3 02/19/2019   MCV 91.2  02/19/2019   PLT 328.0 02/19/2019   Lab Results  Component Value Date   NA 139 02/19/2019   K 4.1 02/19/2019   CO2 27 02/19/2019   GLUCOSE 106 (H) 02/19/2019   BUN 18 02/19/2019   CREATININE 0.79 02/19/2019   BILITOT 0.5 02/19/2019   ALKPHOS 75 02/19/2019   AST 17 02/19/2019   ALT 28 02/19/2019   PROT 6.6 02/19/2019   ALBUMIN 4.3 02/19/2019   CALCIUM 9.5 02/19/2019   ANIONGAP 8 03/18/2016   GFR 73.03 02/19/2019   Lab Results  Component Value Date   CHOL 169 02/19/2019   Lab Results  Component Value Date   HDL 40.00 02/19/2019   Lab Results  Component Value Date   LDLCALC 93 02/19/2019   Lab Results  Component Value Date   TRIG 177.0 (H) 02/19/2019   Lab Results  Component Value Date   CHOLHDL 4 02/19/2019   Lab Results  Component Value Date   HGBA1C 6.2 02/19/2019      Assessment & Plan:   Problem List Items Addressed This Visit      Other   Mixed hyperlipidemia   Right knee pain - Primary   Relevant Medications   diclofenac (VOLTAREN) 75 MG EC tablet   Other Relevant Orders   Ambulatory referral to Sports Medicine   Elevated uric acid in blood      Meds ordered this encounter  Medications  . diclofenac (VOLTAREN) 75 MG EC tablet    Sig: Take 1 tablet (75 mg total) by mouth 2 (two) times daily. With food for 10 days and the prn.    Dispense:  30 tablet    Refill:  0    Follow-up: Return if symptoms worsen or fail to improve.

## 2019-07-13 ENCOUNTER — Encounter: Payer: Self-pay | Admitting: Family Medicine

## 2019-07-14 ENCOUNTER — Encounter: Payer: Self-pay | Admitting: Family Medicine

## 2019-07-14 ENCOUNTER — Other Ambulatory Visit: Payer: Self-pay

## 2019-07-14 ENCOUNTER — Ambulatory Visit: Payer: BC Managed Care – PPO | Admitting: Family Medicine

## 2019-07-14 ENCOUNTER — Ambulatory Visit: Payer: Self-pay

## 2019-07-14 VITALS — Ht 62.0 in | Wt 226.0 lb

## 2019-07-14 DIAGNOSIS — M25561 Pain in right knee: Secondary | ICD-10-CM | POA: Diagnosis not present

## 2019-07-14 MED ORDER — PREDNISONE 5 MG PO TABS: ORAL_TABLET | ORAL | 0 refills | Status: DC

## 2019-07-14 NOTE — Progress Notes (Signed)
Kathy [name] - 63 y.o. female MRN [id number]  Date of birth: [birth year]  SUBJECTIVE:  Including CC & ROS.  Chief Complaint  Patient presents with  . Knee Pain    right knee x 1 month    Kathy [name] is a 63 y.o. female that is presenting with acute right knee pain.  The pain is occurring over the medial joint line.  She denies any specific inciting event.  The pain is become moderate to severe.  It is throbbing.  Is localized to the area.  She feels like the knee wants to give out at times.  Denies any history of surgery.  The pain seems to be worse with ambulation as well as at night.  Has not received any injections..  Independent review of the right knee x-ray from 2019 shows mild degenerative changes.  Review of Systems  Constitutional: Negative for fever.  HENT: Negative for congestion.   Respiratory: Negative for cough.   Cardiovascular: Negative for chest pain.  Gastrointestinal: Negative for abdominal pain.  Musculoskeletal: Positive for arthralgias and back pain.  Skin: Negative for color change.  Neurological: Negative for weakness.  Hematological: Negative for adenopathy.    HISTORY: Past Medical, Surgical, Social, and Family History Reviewed & Updated per EMR.   Pertinent Historical Findings include:  Past Medical History:  Diagnosis Date  . Allergy   . Anxiety   . Arthritis    knees  . H/O seasonal allergies   . History of kidney stones   . Hyperlipidemia   . Hypertension   . Pneumonia   . Renal calculus, right   . Type 2 diabetes mellitus ([facility])   . Wears contact lenses     Past Surgical History:  Procedure Laterality Date  . ABDOMINAL HYSTERECTOMY    . CHOLECYSTECTOMY N/A 01/16/2016   Procedure: LAPAROSCOPIC CHOLECYSTECTOMY WITH INTRAOPERATIVE CHOLANGIOGRAM;  Surgeon: Ovidio Kin, MD;  Location: [facility];  Service: General;  Laterality: N/A;  . CYSTOSCOPY WITH RETROGRADE PYELOGRAM, URETEROSCOPY AND STENT PLACEMENT Right 03/07/2016   Procedure:  CYSTOSCOPY WITH RETROGRADE PYELOGRAM, URETEROSCOPY AND STENT PLACEMENT ;  Surgeon: Bjorn Pippin, MD;  Location: [facility];  Service: Urology;  Laterality: Right;  . CYSTOSCOPY WITH RETROGRADE PYELOGRAM, URETEROSCOPY AND STENT PLACEMENT Right 03/21/2016   Procedure: CYSTOSCOPY WITH RIGHT RETROGRADE PYELOGRAM, RIGHT URETEROSCOPY WITH LASER  AND STENT PLACEMENT;  Surgeon: Bjorn Pippin, MD;  Location: [facility];  Service: Urology;  Laterality: Right;  . HOLMIUM LASER APPLICATION N/A 03/21/2016   Procedure: HOLMIUM LASER APPLICATION;  Surgeon: Bjorn Pippin, MD;  Location: [facility];  Service: Urology;  Laterality: N/A;  . TOTAL ABDOMINAL HYSTERECTOMY W/ BILATERAL SALPINGOOPHORECTOMY  08/1996    Allergies  Allergen Reactions  . Indomethacin   . Statins     Heaviness in legs   . Dexamethasone Palpitations and Rash    Heart races    Family History  Problem Relation Age of Onset  . Breast cancer Mother   . Aneurysm Father        brain aneurysm  . Kidney Stones Father      Social History   Socioeconomic History  . Marital status: Married    Spouse name: Not on file  . Number of children: Not on file  . Years of education: Not on file  . Highest education level: Not on file  Occupational History  . Not on file  Social Needs  . Financial resource strain: Not on file  . Food insecurity  Worry: Not on file    Inability: Not on file  . Transportation needs    Medical: Not on file    Non-medical: Not on file  Tobacco Use  . Smoking status: Never Smoker  . Smokeless tobacco: Never Used  Substance and Sexual Activity  . Alcohol use: Yes    Comment: rare  . Drug use: No  . Sexual activity: Yes    Birth control/protection: Surgical  Lifestyle  . Physical activity    Days per week: Not on file    Minutes per session: Not on file  . Stress: Not on file  Relationships  . Social Herbalist on phone: Not on file    Gets together: Not on file    Attends religious  service: Not on file    Active member of club or organization: Not on file    Attends meetings of clubs or organizations: Not on file    Relationship status: Not on file  . Intimate partner violence    Fear of current or ex partner: Not on file    Emotionally abused: Not on file    Physically abused: Not on file    Forced sexual activity: Not on file  Other Topics Concern  . Not on file  Social History Narrative  . Not on file     PHYSICAL EXAM:  VS: Ht 5\' 2"  (1.575 m)   Wt 226 lb (102.5 kg)   BMI 41.34 kg/m  Physical Exam Gen: NAD, alert, cooperative with exam, well-appearing ENT: normal lips, normal nasal mucosa,  Eye: normal EOM, normal conjunctiva and lids CV:  no edema, +2 pedal pulses   Resp: no accessory muscle use, non-labored,   Skin: no rashes, no areas of induration  Neuro: normal tone, normal sensation to touch Psych:  normal insight, alert and oriented MSK:  Right knee: No obvious effusion. Tenderness to palpation over the medial joint line. No tenderness to palpation over the lateral joint line. Normal range of motion. No instability with valgus or varus stress testing. Negative McMurray's test. No pain with patellar grind. Neurovascularly intact  Limited ultrasound: Right knee:  Mild effusion within the suprapatellar pouch. Normal-appearing quadricep and patellar tendon. Medial joint space narrowing and degenerative changes of the medial meniscus. Normal-appearing lateral joint space and meniscus. No changes of the MCL appreciated  Summary: Findings suggestive of degenerative changes of the medial joint space and meniscus  Ultrasound and interpretation by Clearance Coots, MD   ASSESSMENT & PLAN:   Right knee pain Symptoms likely associated with degenerative changes of the meniscus and joint space.  May have a component of patellofemoral syndrome as well. -Prednisone. -Counseled on home exercise therapy and supportive care. -If no improvement  can consider imaging, injection or physical therapy.

## 2019-07-14 NOTE — Patient Instructions (Signed)
Nice to meet you Please try ice and tylenol  Please try the exercises   Please send me a message in MyChart with any questions or updates.  Please see me back in 4 weeks or sooner if no better.   --Dr. Raeford Razor

## 2019-07-14 NOTE — Assessment & Plan Note (Signed)
Symptoms likely associated with degenerative changes of the meniscus and joint space.  May have a component of patellofemoral syndrome as well. -Prednisone. -Counseled on home exercise therapy and supportive care. -If no improvement can consider imaging, injection or physical therapy.

## 2019-07-20 ENCOUNTER — Encounter: Payer: Self-pay | Admitting: Family Medicine

## 2019-08-11 ENCOUNTER — Ambulatory Visit: Payer: Self-pay

## 2019-08-11 ENCOUNTER — Other Ambulatory Visit: Payer: Self-pay

## 2019-08-11 ENCOUNTER — Ambulatory Visit (HOSPITAL_BASED_OUTPATIENT_CLINIC_OR_DEPARTMENT_OTHER)
Admission: RE | Admit: 2019-08-11 | Discharge: 2019-08-11 | Disposition: A | Discharge status: Home / Self Care | Payer: BC Managed Care – PPO | Source: Ambulatory Visit | Attending: Family Medicine | Admitting: Family Medicine

## 2019-08-11 ENCOUNTER — Ambulatory Visit: Payer: BC Managed Care – PPO | Admitting: Family Medicine

## 2019-08-11 ENCOUNTER — Encounter: Payer: Self-pay | Admitting: Family Medicine

## 2019-08-11 VITALS — Ht 62.0 in | Wt 226.0 lb

## 2019-08-11 DIAGNOSIS — M25561 Pain in right knee: Secondary | ICD-10-CM | POA: Diagnosis not present

## 2019-08-11 DIAGNOSIS — M1711 Unilateral primary osteoarthritis, right knee: Secondary | ICD-10-CM | POA: Diagnosis not present

## 2019-08-11 MED ORDER — TRIAMCINOLONE ACETONIDE 40 MG/ML IJ SUSP
40.0000 mg | Freq: Once | INTRAMUSCULAR | Status: AC
  Administered 2019-08-11: 16:00:00 40 mg via INTRA_ARTICULAR

## 2019-08-11 NOTE — Assessment & Plan Note (Signed)
Acute worsening of right knee.  Likely more degenerative and patellofemoral in nature. -Injection today. -X-ray. -Counseled on home exercise therapy and supportive care. -Could consider gel injections if limited improvement.

## 2019-08-11 NOTE — Progress Notes (Signed)
Kathy [name] - 64 y.o. female MRN [id number]  Date of birth: [birth year]  SUBJECTIVE:  Including CC & ROS.  Chief Complaint  Patient presents with  . Follow-up    follow up for right knee    Kathy [name] is a 64 y.o. female that is presenting with acute worsening of her right knee.  She has tried prednisone with limited improvement.  She has tried home modalities with limited improvement.  The pain is becoming more constant and severe.  It is keeping her up at night.  She has pain that is worse at the end of the day.  Denies any mechanical symptoms.  No previous injections.   Review of Systems See HPI  HISTORY: Past Medical, Surgical, Social, and Family History Reviewed & Updated per EMR.   Pertinent Historical Findings include:  Past Medical History:  Diagnosis Date  . Allergy   . Anxiety   . Arthritis    knees  . H/O seasonal allergies   . History of kidney stones   . Hyperlipidemia   . Hypertension   . Pneumonia   . Renal calculus, right   . Type 2 diabetes mellitus ([place])   . Wears contact lenses     Past Surgical History:  Procedure Laterality Date  . ABDOMINAL HYSTERECTOMY    . CHOLECYSTECTOMY N/A 01/16/2016   Procedure: LAPAROSCOPIC CHOLECYSTECTOMY WITH INTRAOPERATIVE CHOLANGIOGRAM;  Surgeon: Alphonsa Overall, MD;  Location: [facility];  Service: General;  Laterality: N/A;  . CYSTOSCOPY WITH RETROGRADE PYELOGRAM, URETEROSCOPY AND STENT PLACEMENT Right 03/07/2016   Procedure: CYSTOSCOPY WITH RETROGRADE PYELOGRAM, URETEROSCOPY AND STENT PLACEMENT ;  Surgeon: Irine Seal, MD;  Location: [facility];  Service: Urology;  Laterality: Right;  . CYSTOSCOPY WITH RETROGRADE PYELOGRAM, URETEROSCOPY AND STENT PLACEMENT Right 03/21/2016   Procedure: CYSTOSCOPY WITH RIGHT RETROGRADE PYELOGRAM, RIGHT URETEROSCOPY WITH LASER  AND STENT PLACEMENT;  Surgeon: Irine Seal, MD;  Location: [facility];  Service: Urology;  Laterality: Right;  . HOLMIUM LASER APPLICATION N/A 9/92/4268     Procedure: HOLMIUM LASER APPLICATION;  Surgeon: Irine Seal, MD;  Location: [facility];  Service: Urology;  Laterality: N/A;  . TOTAL ABDOMINAL HYSTERECTOMY W/ BILATERAL SALPINGOOPHORECTOMY  08/1996    Allergies  Allergen Reactions  . Indomethacin   . Statins     Heaviness in legs   . Dexamethasone Palpitations and Rash    Heart races    Family History  Problem Relation Age of Onset  . Breast cancer Mother   . Aneurysm Father        brain aneurysm  . Kidney Stones Father      Social History   Socioeconomic History  . Marital status: Married    Spouse name: Not on file  . Number of children: Not on file  . Years of education: Not on file  . Highest education level: Not on file  Occupational History  . Not on file  Tobacco Use  . Smoking status: Never Smoker  . Smokeless tobacco: Never Used  Substance and Sexual Activity  . Alcohol use: Yes    Comment: rare  . Drug use: No  . Sexual activity: Yes    Birth control/protection: Surgical  Other Topics Concern  . Not on file  Social History Narrative  . Not on file   Social Determinants of Health   Financial Resource Strain:   . Difficulty of Paying Living Expenses: Not on file  Food Insecurity:   . Worried About Charity fundraiser  in the Last Year: Not on file  . Ran Out of Food in the Last Year: Not on file  Transportation Needs:   . Lack of Transportation (Medical): Not on file  . Lack of Transportation (Non-Medical): Not on file  Physical Activity:   . Days of Exercise per Week: Not on file  . Minutes of Exercise per Session: Not on file  Stress:   . Feeling of Stress : Not on file  Social Connections:   . Frequency of Communication with Friends and Family: Not on file  . Frequency of Social Gatherings with Friends and Family: Not on file  . Attends Religious Services: Not on file  . Active Member of Clubs or Organizations: Not on file  . Attends Banker Meetings: Not on file  . Marital  Status: Not on file  Intimate Partner Violence:   . Fear of Current or Ex-Partner: Not on file  . Emotionally Abused: Not on file  . Physically Abused: Not on file  . Sexually Abused: Not on file     PHYSICAL EXAM:  VS: Ht 5\' 2"  (1.575 m)   Wt 226 lb (102.5 kg)   BMI 41.34 kg/m  Physical Exam Gen: NAD, alert, cooperative with exam, well-appearing ENT: normal lips, normal nasal mucosa,  Eye: normal EOM, normal conjunctiva and lids CV:  no edema, +2 pedal pulses   Resp: no accessory muscle use, non-labored,  Skin: no rashes, no areas of induration  Neuro: normal tone, normal sensation to touch Psych:  normal insight, alert and oriented MSK:  Right knee: No obvious effusion. Normal range of motion. Normal strength resistance. Instability valgus varus stress testing. Neurovascularly intact   Aspiration/Injection Procedure Note Kathy [name] 11-01-1955  Procedure: Injection Indications: Right knee pain  Procedure Details Consent: Risks of procedure as well as the alternatives and risks of each were explained to the (patient/caregiver).  Consent for procedure obtained. Time Out: Verified patient identification, verified procedure, site/side was marked, verified correct patient position, special equipment/implants available, medications/allergies/relevent history reviewed, required imaging and test results available.  Performed.  The area was cleaned with iodine and alcohol swabs.    The right knee superior lateral suprapatellar pouch was injected using 1 cc's of 40 mg Kenalog and 4 cc's of 0.25% bupivacaine with a 22 1 1/2" needle.  Ultrasound was used. Images were obtained in long views showing the injection.     A sterile dressing was applied.  Patient did tolerate procedure well.     ASSESSMENT & PLAN:   OA (osteoarthritis) of knee Acute worsening of right knee.  Likely more degenerative and patellofemoral in nature. -Injection today. -X-ray. -Counseled on  home exercise therapy and supportive care. -Could consider gel injections if limited improvement.

## 2019-08-11 NOTE — Patient Instructions (Signed)
Good to see you Please try ice  Please try the pennsaid if needed Please try the exercises   Please send me a message in MyChart with any questions or updates.  Please see me back in 4-6 weeks.   --Dr. Jordan Likes

## 2019-08-12 ENCOUNTER — Telehealth: Payer: Self-pay | Admitting: Family Medicine

## 2019-08-12 NOTE — Telephone Encounter (Signed)
Informed of results.   Myra Rude, MD [facility] 08/12/2019, 10:05 AM

## 2019-08-29 ENCOUNTER — Encounter: Payer: Self-pay | Admitting: Family Medicine

## 2019-09-07 ENCOUNTER — Other Ambulatory Visit: Payer: Self-pay

## 2019-09-07 ENCOUNTER — Ambulatory Visit: Payer: BC Managed Care – PPO | Admitting: Family Medicine

## 2019-09-07 ENCOUNTER — Ambulatory Visit: Payer: Self-pay

## 2019-09-07 ENCOUNTER — Encounter: Payer: Self-pay | Admitting: Family Medicine

## 2019-09-07 VITALS — BP 169/86 | Ht 62.0 in | Wt 226.0 lb

## 2019-09-07 DIAGNOSIS — M1711 Unilateral primary osteoarthritis, right knee: Secondary | ICD-10-CM | POA: Diagnosis not present

## 2019-09-07 NOTE — Patient Instructions (Signed)
Good to see you Please try ice  Please continue the exercises   Please send me a message in MyChart with any questions or updates.  Please see me back in 4 weeks.   --Dr. Cherre Kothari  

## 2019-09-07 NOTE — Progress Notes (Addendum)
Kathy [name] - 64 y.o. female MRN [id number]  Date of birth: June 02, 1956  SUBJECTIVE:  Including CC & ROS.  Chief Complaint  Patient presents with  . Knee Pain    right knee    Kathy [name] is a 64 y.o. female that is presenting with ongoing knee pain.  Has tried a steroid injection with limited improvement.  Imaging is showed degenerative change of the knee.   Review of Systems See HPI   HISTORY: Past Medical, Surgical, Social, and Family History Reviewed & Updated per EMR.   Pertinent Historical Findings include:  Past Medical History:  Diagnosis Date  . Allergy   . Anxiety   . Arthritis    knees  . H/O seasonal allergies   . History of kidney stones   . Hyperlipidemia   . Hypertension   . Pneumonia   . Renal calculus, right   . Type 2 diabetes mellitus ([place])   . Wears contact lenses     Past Surgical History:  Procedure Laterality Date  . ABDOMINAL HYSTERECTOMY    . CHOLECYSTECTOMY N/A 01/16/2016   Procedure: LAPAROSCOPIC CHOLECYSTECTOMY WITH INTRAOPERATIVE CHOLANGIOGRAM;  Surgeon: Alphonsa Overall, MD;  Location: [facility];  Service: General;  Laterality: N/A;  . CYSTOSCOPY WITH RETROGRADE PYELOGRAM, URETEROSCOPY AND STENT PLACEMENT Right 03/07/2016   Procedure: CYSTOSCOPY WITH RETROGRADE PYELOGRAM, URETEROSCOPY AND STENT PLACEMENT ;  Surgeon: Irine Seal, MD;  Location: [facility];  Service: Urology;  Laterality: Right;  . CYSTOSCOPY WITH RETROGRADE PYELOGRAM, URETEROSCOPY AND STENT PLACEMENT Right 03/21/2016   Procedure: CYSTOSCOPY WITH RIGHT RETROGRADE PYELOGRAM, RIGHT URETEROSCOPY WITH LASER  AND STENT PLACEMENT;  Surgeon: Irine Seal, MD;  Location: [facility];  Service: Urology;  Laterality: Right;  . HOLMIUM LASER APPLICATION N/A 11/11/8117   Procedure: HOLMIUM LASER APPLICATION;  Surgeon: Irine Seal, MD;  Location: [facility];  Service: Urology;  Laterality: N/A;  . TOTAL ABDOMINAL HYSTERECTOMY W/ BILATERAL SALPINGOOPHORECTOMY  08/1996    Allergies    Allergen Reactions  . Indomethacin   . Statins     Heaviness in legs   . Dexamethasone Palpitations and Rash    Heart races    Family History  Problem Relation Age of Onset  . Breast cancer Mother   . Aneurysm Father        brain aneurysm  . Kidney Stones Father      Social History   Socioeconomic History  . Marital status: Married    Spouse name: Not on file  . Number of children: Not on file  . Years of education: Not on file  . Highest education level: Not on file  Occupational History  . Not on file  Tobacco Use  . Smoking status: Never Smoker  . Smokeless tobacco: Never Used  Substance and Sexual Activity  . Alcohol use: Yes    Comment: rare  . Drug use: No  . Sexual activity: Yes    Birth control/protection: Surgical  Other Topics Concern  . Not on file  Social History Narrative  . Not on file   Social Determinants of Health   Financial Resource Strain:   . Difficulty of Paying Living Expenses: Not on file  Food Insecurity:   . Worried About Charity fundraiser in the Last Year: Not on file  . Ran Out of Food in the Last Year: Not on file  Transportation Needs:   . Lack of Transportation (Medical): Not on file  . Lack of Transportation (Non-Medical): Not on file  Physical Activity:   . Days of Exercise per Week: Not on file  . Minutes of Exercise per Session: Not on file  Stress:   . Feeling of Stress : Not on file  Social Connections:   . Frequency of Communication with Friends and Family: Not on file  . Frequency of Social Gatherings with Friends and Family: Not on file  . Attends Religious Services: Not on file  . Active Member of Clubs or Organizations: Not on file  . Attends Banker Meetings: Not on file  . Marital Status: Not on file  Intimate Partner Violence:   . Fear of Current or Ex-Partner: Not on file  . Emotionally Abused: Not on file  . Physically Abused: Not on file  . Sexually Abused: Not on file     PHYSICAL  EXAM:  VS: BP (!) 169/86   Ht 5\' 2"  (1.575 m)   Wt 226 lb (102.5 kg)   BMI 41.34 kg/m  Physical Exam Gen: NAD, alert, cooperative with exam, well-appearing ENT: normal lips, normal nasal mucosa,  Eye: normal EOM, normal conjunctiva and lids Skin: no rashes, no areas of induration  Neuro: normal tone, normal sensation to touch Psych:  normal insight, alert and oriented MSK:  Right knee:  No obvious effusion. Instability with valgus and varus stress testing. Normal range of motion. Neurovascularly intact   Aspiration/Injection Procedure Note Kathy [name] 11-Aug-1955  Procedure: Injection Indications: right knee pain   Procedure Details Consent: Risks of procedure as well as the alternatives and risks of each were explained to the (patient/caregiver).  Consent for procedure obtained. Time Out: Verified patient identification, verified procedure, site/side was marked, verified correct patient position, special equipment/implants available, medications/allergies/relevent history reviewed, required imaging and test results available.  Performed.  The area was cleaned with iodine and alcohol swabs.    The right knee superior lateral suprapatellar pouch was injected using 4 cc's of 1% lidocaine with a 22 1 1/2" needle.  The syringe was switched and a Durolane 60 mg/ 3 mL was injected. Ultrasound was used. Images were obtained in  Long views showing the injection.    A sterile dressing was applied.  Patient did tolerate procedure well.    ASSESSMENT & PLAN:   OA (osteoarthritis) of knee Pain is still ongoing.  Little improvement with the steroid injection we will try the gel injection today. - durolane  - counseled on HEp and supportive care -Could consider medial unloader brace due to thigh to calf ratio.  Could consider physical therapy

## 2019-09-07 NOTE — Assessment & Plan Note (Signed)
Pain is still ongoing.  Little improvement with the steroid injection we will try the gel injection today. - durolane  - counseled on HEp and supportive care -Could consider medial unloader brace due to thigh to calf ratio.  Could consider physical therapy

## 2019-09-09 ENCOUNTER — Encounter: Payer: Self-pay | Admitting: Family Medicine

## 2019-09-09 ENCOUNTER — Other Ambulatory Visit: Payer: Self-pay | Admitting: *Deleted

## 2019-09-09 DIAGNOSIS — M545 Low back pain, unspecified: Secondary | ICD-10-CM

## 2019-09-09 MED ORDER — TRAMADOL HCL 50 MG PO TABS: ORAL_TABLET | ORAL | 0 refills | Status: DC

## 2019-09-13 ENCOUNTER — Encounter: Payer: Self-pay | Admitting: Family Medicine

## 2019-09-13 ENCOUNTER — Ambulatory Visit: Payer: BC Managed Care – PPO

## 2019-09-14 ENCOUNTER — Ambulatory Visit (INDEPENDENT_AMBULATORY_CARE_PROVIDER_SITE_OTHER): Payer: BC Managed Care – PPO | Admitting: Family Medicine

## 2019-09-14 ENCOUNTER — Encounter: Payer: Self-pay | Admitting: Family Medicine

## 2019-09-14 ENCOUNTER — Other Ambulatory Visit: Payer: Self-pay

## 2019-09-14 VITALS — BP 144/90 | HR 110 | Temp 97.6°F | Ht 62.0 in | Wt 225.2 lb

## 2019-09-14 DIAGNOSIS — E790 Hyperuricemia without signs of inflammatory arthritis and tophaceous disease: Secondary | ICD-10-CM

## 2019-09-14 DIAGNOSIS — E782 Mixed hyperlipidemia: Secondary | ICD-10-CM | POA: Diagnosis not present

## 2019-09-14 DIAGNOSIS — R0789 Other chest pain: Secondary | ICD-10-CM | POA: Diagnosis not present

## 2019-09-14 DIAGNOSIS — E119 Type 2 diabetes mellitus without complications: Secondary | ICD-10-CM

## 2019-09-14 DIAGNOSIS — I10 Essential (primary) hypertension: Secondary | ICD-10-CM

## 2019-09-14 DIAGNOSIS — M10072 Idiopathic gout, left ankle and foot: Secondary | ICD-10-CM | POA: Diagnosis not present

## 2019-09-14 DIAGNOSIS — R079 Chest pain, unspecified: Secondary | ICD-10-CM

## 2019-09-14 HISTORY — DX: Chest pain, unspecified: R07.9

## 2019-09-14 LAB — URINALYSIS, ROUTINE W REFLEX MICROSCOPIC
Bilirubin Urine: NEGATIVE
Hgb urine dipstick: NEGATIVE
Ketones, ur: NEGATIVE
Leukocytes,Ua: NEGATIVE
Nitrite: NEGATIVE
RBC / HPF: NONE SEEN (ref 0–?)
Specific Gravity, Urine: 1.015 (ref 1.000–1.030)
Total Protein, Urine: NEGATIVE
Urine Glucose: NEGATIVE
Urobilinogen, UA: 0.2 (ref 0.0–1.0)
pH: 6 (ref 5.0–8.0)

## 2019-09-14 LAB — COMPREHENSIVE METABOLIC PANEL
ALT: 24 U/L (ref 0–35)
AST: 15 U/L (ref 0–37)
Albumin: 4.7 g/dL (ref 3.5–5.2)
Alkaline Phosphatase: 90 U/L (ref 39–117)
BUN: 16 mg/dL (ref 6–23)
CO2: 29 mEq/L (ref 19–32)
Calcium: 10.3 mg/dL (ref 8.4–10.5)
Chloride: 102 mEq/L (ref 96–112)
Creatinine, Ser: 0.83 mg/dL (ref 0.40–1.20)
GFR: 68.86 mL/min (ref >60.00)
Glucose, Bld: 128 mg/dL — ABNORMAL HIGH (ref 70–99)
Potassium: 4.2 mEq/L (ref 3.5–5.1)
Sodium: 140 mEq/L (ref 135–145)
Total Bilirubin: 0.4 mg/dL (ref 0.2–1.2)
Total Protein: 7.4 g/dL (ref 6.0–8.3)

## 2019-09-14 LAB — CBC
HCT: 47.4 % — ABNORMAL HIGH (ref 36.0–46.0)
Hemoglobin: 15.6 g/dL — ABNORMAL HIGH (ref 12.0–15.0)
MCHC: 33 g/dL (ref 30.0–36.0)
MCV: 92.5 fl (ref 78.0–100.0)
Platelets: 363 10*3/uL (ref 150.0–400.0)
RBC: 5.12 Mil/uL — ABNORMAL HIGH (ref 3.87–5.11)
RDW: 13.9 % (ref 11.5–15.5)
WBC: 10.3 10*3/uL (ref 4.0–10.5)

## 2019-09-14 LAB — LDL CHOLESTEROL, DIRECT: Direct LDL: 179 mg/dL

## 2019-09-14 LAB — URIC ACID: Uric Acid, Serum: 5.9 mg/dL (ref 2.4–7.0)

## 2019-09-14 LAB — HEMOGLOBIN A1C: Hgb A1c MFr Bld: 6.6 % — ABNORMAL HIGH (ref 4.6–6.5)

## 2019-09-14 LAB — TSH: TSH: 1.73 u[IU]/mL (ref 0.35–4.50)

## 2019-09-14 MED ORDER — ALLOPURINOL 100 MG PO TABS: 100.0000 mg | ORAL_TABLET | Freq: Every day | ORAL | 3 refills | Status: DC

## 2019-09-14 MED ORDER — METOPROLOL SUCCINATE ER 50 MG PO TB24: 50.0000 mg | ORAL_TABLET | Freq: Every day | ORAL | 3 refills | Status: DC

## 2019-09-14 MED ORDER — DILTIAZEM HCL ER BEADS 360 MG PO CP24: 360.0000 mg | ORAL_CAPSULE | Freq: Every morning | ORAL | 1 refills | Status: DC

## 2019-09-14 MED ORDER — LISINOPRIL 20 MG PO TABS: 20.0000 mg | ORAL_TABLET | Freq: Every day | ORAL | 1 refills | Status: DC

## 2019-09-14 MED ORDER — CLONIDINE HCL 0.1 MG PO TABS: 0.1000 mg | ORAL_TABLET | Freq: Two times a day (BID) | ORAL | 1 refills | Status: DC

## 2019-09-14 NOTE — Progress Notes (Signed)
Established Patient Office Visit  Subjective:  Patient ID: Kathy [name], female    DOB: [birth year]  Age: 64 y.o. MRN: [id number]  CC:  Chief Complaint  Patient presents with  . Chest Pain    pt c/o elevated BP and chest pains this moring highest BP 240/120. Patient states that there are no more chest pains she was advised to be evaluated. Seen EMS this morning.      HPI Kathy [name] presents for follow-up status post acute episode of left anterior chest pain.  EMS was called.  Upon arrival her blood pressure was 190/100 EKG showed regular rate and rhythm Q waves in aVF and poor R wave progression V2 through V6.  She has not felt well over the last few days.  Appetite has been diminished. Chest pain has subsided. Denies sob or diaphoresis.  Past Medical History:  Diagnosis Date  . Allergy   . Anxiety   . Arthritis    knees  . H/O seasonal allergies   . History of kidney stones   . Hyperlipidemia   . Hypertension   . Pneumonia   . Renal calculus, right   . Type 2 diabetes mellitus ([place])   . Wears contact lenses     Past Surgical History:  Procedure Laterality Date  . ABDOMINAL HYSTERECTOMY    . CHOLECYSTECTOMY N/A 01/16/2016   Procedure: LAPAROSCOPIC CHOLECYSTECTOMY WITH INTRAOPERATIVE CHOLANGIOGRAM;  Surgeon: Alphonsa Overall, MD;  Location: [facility];  Service: General;  Laterality: N/A;  . CYSTOSCOPY WITH RETROGRADE PYELOGRAM, URETEROSCOPY AND STENT PLACEMENT Right 03/07/2016   Procedure: CYSTOSCOPY WITH RETROGRADE PYELOGRAM, URETEROSCOPY AND STENT PLACEMENT ;  Surgeon: Irine Seal, MD;  Location: [facility];  Service: Urology;  Laterality: Right;  . CYSTOSCOPY WITH RETROGRADE PYELOGRAM, URETEROSCOPY AND STENT PLACEMENT Right 03/21/2016   Procedure: CYSTOSCOPY WITH RIGHT RETROGRADE PYELOGRAM, RIGHT URETEROSCOPY WITH LASER  AND STENT PLACEMENT;  Surgeon: Irine Seal, MD;  Location: [facility];  Service: Urology;  Laterality: Right;  . HOLMIUM LASER APPLICATION N/A  08/05/7508   Procedure: HOLMIUM LASER APPLICATION;  Surgeon: Irine Seal, MD;  Location: [facility];  Service: Urology;  Laterality: N/A;  . TOTAL ABDOMINAL HYSTERECTOMY W/ BILATERAL SALPINGOOPHORECTOMY  08/1996    Family History  Problem Relation Age of Onset  . Breast cancer Mother   . Aneurysm Father        brain aneurysm  . Kidney Stones Father     Social History   Socioeconomic History  . Marital status: Married    Spouse name: Not on file  . Number of children: Not on file  . Years of education: Not on file  . Highest education level: Not on file  Occupational History  . Not on file  Tobacco Use  . Smoking status: Never Smoker  . Smokeless tobacco: Never Used  Substance and Sexual Activity  . Alcohol use: Yes    Comment: rare  . Drug use: No  . Sexual activity: Yes    Birth control/protection: Surgical  Other Topics Concern  . Not on file  Social History Narrative  . Not on file   Social Determinants of Health   Financial Resource Strain:   . Difficulty of Paying Living Expenses: Not on file  Food Insecurity:   . Worried About Charity fundraiser in the Last Year: Not on file  . Ran Out of Food in the Last Year: Not on file  Transportation Needs:   . Lack of Transportation (Medical): Not  on file  . Lack of Transportation (Non-Medical): Not on file  Physical Activity:   . Days of Exercise per Week: Not on file  . Minutes of Exercise per Session: Not on file  Stress:   . Feeling of Stress : Not on file  Social Connections:   . Frequency of Communication with Friends and Family: Not on file  . Frequency of Social Gatherings with Friends and Family: Not on file  . Attends Religious Services: Not on file  . Active Member of Clubs or Organizations: Not on file  . Attends Banker Meetings: Not on file  . Marital Status: Not on file  Intimate Partner Violence:   . Fear of Current or Ex-Partner: Not on file  . Emotionally Abused: Not on file  .  Physically Abused: Not on file  . Sexually Abused: Not on file    Outpatient Medications Prior to Visit  Medication Sig Dispense Refill  . ALPRAZolam (XANAX) 1 MG tablet Take 0.5 tablets (0.5 mg total) by mouth at bedtime as needed for sleep. 20 tablet 0  . Colchicine (MITIGARE) 0.6 MG CAPS Take 1 capsule by mouth 2 (two) times daily. 180 capsule 0  . levocetirizine (XYZAL) 5 MG tablet Take 5 mg by mouth every evening.    . metFORMIN (GLUCOPHAGE-XR) 500 MG 24 hr tablet TAKE 2 TABLETS .BY MOUTH AT BEDTIME. 180 tablet 1  . traMADol (ULTRAM) 50 MG tablet Take one at night as needed for pain. 15 tablet 0  . Vitamin D, Ergocalciferol, (DRISDOL) 1.25 MG (50000 UT) CAPS capsule Take 1 capsule (50,000 Units total) by mouth every 7 (seven) days. 5 capsule 2  . WELCHOL 625 MG tablet TAKE 3 TABLETS BY MOUTH 2 TIMES DAILY WITH A MEAL. 540 tablet 1  . allopurinol (ZYLOPRIM) 100 MG tablet TAKE 1 TABLET BY MOUTH EVERY DAY 90 tablet 3  . diltiazem (TIAZAC) 360 MG 24 hr capsule Take 1 capsule (360 mg total) by mouth every morning. 90 capsule 1  . lisinopril (ZESTRIL) 20 MG tablet TAKE 1 TABLET BY MOUTH EVERY DAY 90 tablet 1  . albuterol (PROVENTIL HFA;VENTOLIN HFA) 108 (90 Base) MCG/ACT inhaler Inhale 1 puff into the lungs every 4 (four) hours as needed for wheezing or shortness of breath. Or cough (Patient not taking: Reported on 05/06/2019) 1 Inhaler 2  . diclofenac (VOLTAREN) 75 MG EC tablet Take 1 tablet (75 mg total) by mouth 2 (two) times daily. With food for 10 days and the prn. 30 tablet 0  . methocarbamol (ROBAXIN) 500 MG tablet Take 1 tablet (500 mg total) by mouth every 8 (eight) hours as needed for muscle spasms. 30 tablet 0  . predniSONE (DELTASONE) 5 MG tablet Take 6 pills for first day, 5 pills second day, 4 pills third day, 3 pills fourth day, 2 pills the fifth day, and 1 pill sixth day. 21 tablet 0  . triamcinolone ointment (KENALOG) 0.5 % Apply to rash on hands and shins sparingly twice daily.  Not for face or private areas. (Patient not taking: Reported on 09/14/2019) 30 g 1   No facility-administered medications prior to visit.    Allergies  Allergen Reactions  . Indomethacin   . Statins     Heaviness in legs   . Dexamethasone Palpitations and Rash    Heart races    ROS Review of Systems    Objective:    Physical Exam  BP (!) 144/90   Pulse (!) 110   Temp 97.6  F (36.4 C) (Tympanic)   Ht 5\' 2"  (1.575 m)   Wt 225 lb 3.2 oz (102.2 kg)   SpO2 97%   BMI 41.19 kg/m  Wt Readings from Last 3 Encounters:  09/14/19 225 lb 3.2 oz (102.2 kg)  09/07/19 226 lb (102.5 kg)  08/11/19 226 lb (102.5 kg)     Health Maintenance Due  Topic Date Due  . HIV Screening  01/27/1969  . FOOT EXAM  12/26/2017  . OPHTHALMOLOGY EXAM  04/29/2018  . HEMOGLOBIN A1C  08/22/2019    There are no preventive care reminders to display for this patient.  Lab Results  Component Value Date   TSH 2.02 01/28/2017   Lab Results  Component Value Date   WBC 8.2 02/19/2019   HGB 13.4 02/19/2019   HCT 41.3 02/19/2019   MCV 91.2 02/19/2019   PLT 328.0 02/19/2019   Lab Results  Component Value Date   NA 139 02/19/2019   K 4.1 02/19/2019   CO2 27 02/19/2019   GLUCOSE 106 (H) 02/19/2019   BUN 18 02/19/2019   CREATININE 0.79 02/19/2019   BILITOT 0.5 02/19/2019   ALKPHOS 75 02/19/2019   AST 17 02/19/2019   ALT 28 02/19/2019   PROT 6.6 02/19/2019   ALBUMIN 4.3 02/19/2019   CALCIUM 9.5 02/19/2019   ANIONGAP 8 03/18/2016   GFR 73.03 02/19/2019   Lab Results  Component Value Date   CHOL 169 02/19/2019   Lab Results  Component Value Date   HDL 40.00 02/19/2019   Lab Results  Component Value Date   LDLCALC 93 02/19/2019   Lab Results  Component Value Date   TRIG 177.0 (H) 02/19/2019   Lab Results  Component Value Date   CHOLHDL 4 02/19/2019   Lab Results  Component Value Date   HGBA1C 6.2 02/19/2019      Assessment & Plan:   Problem List Items Addressed This  Visit      Cardiovascular and Mediastinum   Essential hypertension - Primary   Relevant Medications   diltiazem (TIAZAC) 360 MG 24 hr capsule   lisinopril (ZESTRIL) 20 MG tablet   cloNIDine (CATAPRES) 0.1 MG tablet   Other Relevant Orders   CBC   Comprehensive metabolic panel   TSH   Urinalysis, Routine w reflex microscopic   EKG 12-Lead (Completed)     Endocrine   Diabetes mellitus without complication (HCC)   Relevant Medications   lisinopril (ZESTRIL) 20 MG tablet   Other Relevant Orders   Hemoglobin A1c     Musculoskeletal and Integument   Acute idiopathic gout involving toe of left foot   Relevant Medications   allopurinol (ZYLOPRIM) 100 MG tablet     Other   Mixed hyperlipidemia   Relevant Medications   diltiazem (TIAZAC) 360 MG 24 hr capsule   lisinopril (ZESTRIL) 20 MG tablet   cloNIDine (CATAPRES) 0.1 MG tablet   Other Relevant Orders   LDL cholesterol, direct   Elevated uric acid in blood   Relevant Medications   allopurinol (ZYLOPRIM) 100 MG tablet   Other Relevant Orders   Uric acid   Chest pain   Relevant Orders   Ambulatory referral to Cardiology      Meds ordered this encounter  Medications  . DISCONTD: metoprolol succinate (TOPROL-XL) 50 MG 24 hr tablet    Sig: Take 1 tablet (50 mg total) by mouth daily. Take with or immediately following a meal.    Dispense:  90 tablet    Refill:  3  . allopurinol (ZYLOPRIM) 100 MG tablet    Sig: Take 1 tablet (100 mg total) by mouth daily.    Dispense:  90 tablet    Refill:  3    DX Code Needed  .  . diltiazem (TIAZAC) 360 MG 24 hr capsule    Sig: Take 1 capsule (360 mg total) by mouth every morning.    Dispense:  90 capsule    Refill:  1  . lisinopril (ZESTRIL) 20 MG tablet    Sig: Take 1 tablet (20 mg total) by mouth daily.    Dispense:  90 tablet    Refill:  1  . cloNIDine (CATAPRES) 0.1 MG tablet    Sig: Take 1 tablet (0.1 mg total) by mouth 2 (two) times daily.    Dispense:  60 tablet     Refill:  1    Follow-up: Return in about 1 week (around 09/21/2019).   Our EKG in the office did not show a significant Q waves in aVF but did suggest poor R wave progression in the chest leads.  There was no ST segment elevation or T wave inversion.  Am concerned about the possibility of angina and this patient with multiple risk factors.  Have asked for an urgent referral to cardiology.  Patient is to call [phone] with any further chest pain and proceed immediately to the emergency room.  Have added clonidine 0.1 mg twice daily. [name], MD

## 2019-09-15 ENCOUNTER — Emergency Department (HOSPITAL_BASED_OUTPATIENT_CLINIC_OR_DEPARTMENT_OTHER)
Admission: EM | Admit: 2019-09-15 | Discharge: 2019-09-15 | Disposition: A | Discharge status: Home / Self Care | Payer: BC Managed Care – PPO | Attending: Emergency Medicine | Admitting: Emergency Medicine

## 2019-09-15 ENCOUNTER — Other Ambulatory Visit: Payer: BC Managed Care – PPO

## 2019-09-15 ENCOUNTER — Encounter: Payer: Self-pay | Admitting: *Deleted

## 2019-09-15 ENCOUNTER — Emergency Department (HOSPITAL_BASED_OUTPATIENT_CLINIC_OR_DEPARTMENT_OTHER): Payer: BC Managed Care – PPO

## 2019-09-15 ENCOUNTER — Other Ambulatory Visit: Payer: Self-pay

## 2019-09-15 ENCOUNTER — Ambulatory Visit (INDEPENDENT_AMBULATORY_CARE_PROVIDER_SITE_OTHER): Payer: BC Managed Care – PPO | Admitting: Cardiology

## 2019-09-15 ENCOUNTER — Encounter (HOSPITAL_BASED_OUTPATIENT_CLINIC_OR_DEPARTMENT_OTHER): Payer: Self-pay | Admitting: Emergency Medicine

## 2019-09-15 ENCOUNTER — Telehealth: Payer: Self-pay | Admitting: *Deleted

## 2019-09-15 VITALS — BP 158/102 | HR 96 | Ht 62.0 in | Wt 225.0 lb

## 2019-09-15 DIAGNOSIS — R079 Chest pain, unspecified: Secondary | ICD-10-CM

## 2019-09-15 DIAGNOSIS — E119 Type 2 diabetes mellitus without complications: Secondary | ICD-10-CM | POA: Diagnosis not present

## 2019-09-15 DIAGNOSIS — Z79899 Other long term (current) drug therapy: Secondary | ICD-10-CM | POA: Insufficient documentation

## 2019-09-15 DIAGNOSIS — Z7984 Long term (current) use of oral hypoglycemic drugs: Secondary | ICD-10-CM | POA: Insufficient documentation

## 2019-09-15 DIAGNOSIS — I10 Essential (primary) hypertension: Secondary | ICD-10-CM | POA: Insufficient documentation

## 2019-09-15 DIAGNOSIS — Z888 Allergy status to other drugs, medicaments and biological substances status: Secondary | ICD-10-CM | POA: Insufficient documentation

## 2019-09-15 DIAGNOSIS — R0789 Other chest pain: Secondary | ICD-10-CM | POA: Insufficient documentation

## 2019-09-15 DIAGNOSIS — E782 Mixed hyperlipidemia: Secondary | ICD-10-CM

## 2019-09-15 LAB — CBC WITH DIFFERENTIAL/PLATELET
Abs Immature Granulocytes: 0.05 10*3/uL (ref 0.00–0.07)
Basophils Absolute: 0 10*3/uL (ref 0.0–0.1)
Basophils Relative: 1 %
Eosinophils Absolute: 0 10*3/uL (ref 0.0–0.5)
Eosinophils Relative: 1 %
HCT: 46.3 % — ABNORMAL HIGH (ref 36.0–46.0)
Hemoglobin: 15 g/dL (ref 12.0–15.0)
Immature Granulocytes: 1 %
Lymphocytes Relative: 21 %
Lymphs Abs: 1.8 10*3/uL (ref 0.7–4.0)
MCH: 30.1 pg (ref 26.0–34.0)
MCHC: 32.4 g/dL (ref 30.0–36.0)
MCV: 92.8 fL (ref 80.0–100.0)
Monocytes Absolute: 0.6 10*3/uL (ref 0.1–1.0)
Monocytes Relative: 6 %
Neutro Abs: 6.3 10*3/uL (ref 1.7–7.7)
Neutrophils Relative %: 70 %
Platelets: 366 10*3/uL (ref 150–400)
RBC: 4.99 MIL/uL (ref 3.87–5.11)
RDW: 13.1 % (ref 11.5–15.5)
WBC: 8.8 10*3/uL (ref 4.0–10.5)
nRBC: 0 % (ref 0.0–0.2)

## 2019-09-15 LAB — BASIC METABOLIC PANEL
Anion gap: 10 (ref 5–15)
BUN: 22 mg/dL (ref 8–23)
CO2: 26 mmol/L (ref 22–32)
Calcium: 9.7 mg/dL (ref 8.9–10.3)
Chloride: 102 mmol/L (ref 98–111)
Creatinine, Ser: 0.94 mg/dL (ref 0.44–1.00)
GFR calc Af Amer: >60 mL/min (ref >60)
GFR calc non Af Amer: >60 mL/min (ref >60)
Glucose, Bld: 107 mg/dL — ABNORMAL HIGH (ref 70–99)
Potassium: 4.1 mmol/L (ref 3.5–5.1)
Sodium: 138 mmol/L (ref 135–145)

## 2019-09-15 LAB — TROPONIN I (HIGH SENSITIVITY)
High Sens Troponin I: 4 ng/L (ref 2–17)
Troponin I (High Sensitivity): 3 ng/L (ref <18)

## 2019-09-15 LAB — D-DIMER, QUANTITATIVE: D-Dimer, Quant: <0.27 ug/mL-FEU (ref 0.00–0.50)

## 2019-09-15 MED ORDER — METOPROLOL SUCCINATE ER 50 MG PO TB24: 50.0000 mg | ORAL_TABLET | Freq: Every day | ORAL | 3 refills | Status: DC

## 2019-09-15 NOTE — Patient Instructions (Addendum)
PLEASE GO TO THE EMERGENCY ROOM DOWNSTAIRS  Medication Instructions:  Your physician has recommended you make the following change in your medication:  1.  START Metoprolol XL 50 mg taking 1 tablet daily  *If you need a refill on your cardiac medications before your next appointment, please call your pharmacy*  Lab Work: None ordered  If you have labs (blood work) drawn today and your tests are completely normal, you will receive your results only by: Marland Kitchen MyChart Message (if you have MyChart) OR . A paper copy in the mail If you have any lab test that is abnormal or we need to change your treatment, we will call you to review the results.  Testing/Procedures: None ordered today.  If they send you home from the Emergency Room, call the office so we can order a stress test.  Follow-Up: At [facility], you and your health needs are our priority.  As part of our continuing mission to provide you with exceptional heart care, we have created designated Provider Care Teams.  These Care Teams include your primary Cardiologist (physician) and Advanced Practice Providers (APPs -  Physician Assistants and Nurse Practitioners) who all work together to provide you with the care you need, when you need it.  Your next appointment:   Will be determined.

## 2019-09-15 NOTE — Addendum Note (Signed)
Addended by: Varney Biles on: 09/15/2019 08:26 AM   Modules accepted: Orders

## 2019-09-15 NOTE — Telephone Encounter (Signed)
Called pt re: visit to the ED. Pt would like to pursue the [facility].  Pt aware place a order and someone will contact her to arrange a time and date.  Instruction letter is done and pending.

## 2019-09-15 NOTE — Progress Notes (Signed)
Cardiology Office Note:    Date:  09/15/2019   ID:  Kathy, [name] 02-19-1956, MRN [id number]  PCP:  Kathy Maw, MD  Cardiologist:  Shirlee More, MD   Referring MD: Kathy [name],*  ASSESSMENT:    1. Chest pain, unspecified type   2. Essential hypertension   3. Mixed hyperlipidemia   4. Diabetes mellitus without complication ([place])    PLAN:    In order of problems listed above:  1. She presents with concerning symptoms for acute coronary syndrome will check EKG before leaving the office regardless I had like her to go the emergency room have high-sensitivity troponin.  I called reviewed with the ED physician who agrees.  [place] diagnosis also includes venous thromboembolism check D-dimer may benefit from a CT of the chest regardless with her severe hypertension exclude aortopathy. 2. Is poorly controlled she was started on clonidine yesterday I would not alter her medications at this time except to start her on a low-dose beta-blocker like Toprol-XL 50 mg daily 3. Continue her statin 4. Managed by her PCP  Next appointment to be determined after ED visit   Medication Adjustments/Labs and Tests Ordered: Current medicines are reviewed at length with the patient today.  Concerns regarding medicines are outlined above.  No orders of the defined types were placed in this encounter.  No orders of the defined types were placed in this encounter.    No chief complaint on file.   History of Present Illness:    Kathy [name] is a 64 y.o. female tension hyperlipidemia and type 2 diabetes who is being seen today for the evaluation of chest pain at the request of Kathy [name],*. She was seen yesterday with her PCP accelerated hypertension blood pressure 190/100 and an episode of left anterior chest pain evaluated by EMS at her home.  She has a history of hypertension hyperlipidemia and type 2 diabetes.  Blood pressure is 144/90 with a pulse  of 110 bpm.  She takes WelChol for dyslipidemia and is on 2 antihypertensive drugs with relative heart rate slowing effect clonidine and diltiazem.  History of thyroid disease a TSH was ordered yesterday and 2018 was normal.  She is seen today in follow-up to yesterday's visit.  She does not feel well and is apprehensive.  Blood pressure remains elevated today my office 158/102.  She told me she had recent orthopedic surgery visits and her blood pressure was consistently elevated and she said she thinks at one time it was in the range of 193 systolic.  Chest pain the evening prior occurred at rest she describes as a sharp discomfort moderate severity located in the left sternal area not pleuritic but she was short of breath with it and very apprehensive total episode lasted 10 to 15 minutes and by the time EMS arrived it had abated.  They encouraged her to go the emergency room she decided not to.  Since that time she just vaguely does not feel well and she is apprehensive.  She had palpitation associated with it.  She is a very high risk patient and despite a normal EKG I think she needs to have high-sensitivity troponin checked and if is elevated she needs to be treated as an acute coronary syndrome patient.  She also needs a D-dimer and if elevated would need a cardiac CTA.  No discrete risk factors for deep vein thrombosis although she has had knee pain and has had orthopedic injections.  She has  had no recent surgery no family history of venous thromboembolism not take hormone replacement therapy.  Past Medical History:  Diagnosis Date  . Allergy   . Anxiety   . Arthritis    knees  . H/O seasonal allergies   . History of kidney stones   . Hyperlipidemia   . Hypertension   . Pneumonia   . Renal calculus, right   . Type 2 diabetes mellitus (HCC)   . Wears contact lenses     Past Surgical History:  Procedure Laterality Date  . ABDOMINAL HYSTERECTOMY    . CHOLECYSTECTOMY N/A 01/16/2016    Procedure: LAPAROSCOPIC CHOLECYSTECTOMY WITH INTRAOPERATIVE CHOLANGIOGRAM;  Surgeon: Ovidio Kin, MD;  Location: [facility];  Service: General;  Laterality: N/A;  . CYSTOSCOPY WITH RETROGRADE PYELOGRAM, URETEROSCOPY AND STENT PLACEMENT Right 03/07/2016   Procedure: CYSTOSCOPY WITH RETROGRADE PYELOGRAM, URETEROSCOPY AND STENT PLACEMENT ;  Surgeon: Bjorn Pippin, MD;  Location: [facility];  Service: Urology;  Laterality: Right;  . CYSTOSCOPY WITH RETROGRADE PYELOGRAM, URETEROSCOPY AND STENT PLACEMENT Right 03/21/2016   Procedure: CYSTOSCOPY WITH RIGHT RETROGRADE PYELOGRAM, RIGHT URETEROSCOPY WITH LASER  AND STENT PLACEMENT;  Surgeon: Bjorn Pippin, MD;  Location: [facility];  Service: Urology;  Laterality: Right;  . HOLMIUM LASER APPLICATION N/A 03/21/2016   Procedure: HOLMIUM LASER APPLICATION;  Surgeon: Bjorn Pippin, MD;  Location: [facility];  Service: Urology;  Laterality: N/A;  . TOTAL ABDOMINAL HYSTERECTOMY W/ BILATERAL SALPINGOOPHORECTOMY  08/1996    Current Medications: Current Meds  Medication Sig  . allopurinol (ZYLOPRIM) 100 MG tablet Take 1 tablet (100 mg total) by mouth daily.  Marland Kitchen ALPRAZolam (XANAX) 1 MG tablet Take 0.5 tablets (0.5 mg total) by mouth at bedtime as needed for sleep.  . cloNIDine (CATAPRES) 0.1 MG tablet Take 1 tablet (0.1 mg total) by mouth 2 (two) times daily.  . Colchicine (MITIGARE) 0.6 MG CAPS Take 1 capsule by mouth 2 (two) times daily. (Patient taking differently: Take 1 capsule by mouth 2 (two) times daily as needed. )  . diltiazem (TIAZAC) 360 MG 24 hr capsule Take 1 capsule (360 mg total) by mouth every morning.  Marland Kitchen levocetirizine (XYZAL) 5 MG tablet Take 5 mg by mouth every evening.  Marland Kitchen lisinopril (ZESTRIL) 20 MG tablet Take 1 tablet (20 mg total) by mouth daily.  . metFORMIN (GLUCOPHAGE-XR) 500 MG 24 hr tablet TAKE 2 TABLETS .BY MOUTH AT BEDTIME.  . traMADol (ULTRAM) 50 MG tablet Take one at night as needed for pain.  . Vitamin D, Ergocalciferol, (DRISDOL) 1.25 MG  (50000 UT) CAPS capsule Take 1 capsule (50,000 Units total) by mouth every 7 (seven) days.  . WELCHOL 625 MG tablet TAKE 3 TABLETS BY MOUTH 2 TIMES DAILY WITH A MEAL. (Patient taking differently: daily. )     Allergies:   Indomethacin, Statins, and Dexamethasone   Social History   Socioeconomic History  . Marital status: Married    Spouse name: Not on file  . Number of children: Not on file  . Years of education: Not on file  . Highest education level: Not on file  Occupational History  . Not on file  Tobacco Use  . Smoking status: Never Smoker  . Smokeless tobacco: Never Used  Substance and Sexual Activity  . Alcohol use: Yes    Comment: rare  . Drug use: No  . Sexual activity: Yes    Birth control/protection: Surgical  Other Topics Concern  . Not on file  Social History Narrative  . Not on  file   Social Determinants of Health   Financial Resource Strain:   . Difficulty of Paying Living Expenses: Not on file  Food Insecurity:   . Worried About Programme researcher, broadcasting/film/video in the Last Year: Not on file  . Ran Out of Food in the Last Year: Not on file  Transportation Needs:   . Lack of Transportation (Medical): Not on file  . Lack of Transportation (Non-Medical): Not on file  Physical Activity:   . Days of Exercise per Week: Not on file  . Minutes of Exercise per Session: Not on file  Stress:   . Feeling of Stress : Not on file  Social Connections:   . Frequency of Communication with Friends and Family: Not on file  . Frequency of Social Gatherings with Friends and Family: Not on file  . Attends Religious Services: Not on file  . Active Member of Clubs or Organizations: Not on file  . Attends Banker Meetings: Not on file  . Marital Status: Not on file     Family History: The patient's family history includes Aneurysm in her father; Breast cancer in her mother; Kidney Stones in her father.  ROS:   Review of Systems  Constitution: Positive for  malaise/fatigue.  HENT: Negative.   Eyes: Negative.   Cardiovascular: Positive for chest pain and palpitations.  Respiratory: Positive for shortness of breath.   Endocrine: Negative.   Hematologic/Lymphatic: Negative.   Skin: Negative.   Musculoskeletal: Positive for joint pain.  Gastrointestinal: Negative.   Genitourinary: Negative.   Neurological: Negative.   Psychiatric/Behavioral: Negative.   Allergic/Immunologic: Negative.    Please see the history of present illness.     All other systems reviewed and are negative.  EKGs/Labs/Other Studies Reviewed:    The following studies were reviewed today:   EKG:  EKG is  ordered today.  The ekg ordered today is personally reviewed and demonstrates sinus rhythm 94 bpm poor R wave progression versus anterolateral MI EKG yesterday 09/14/2019 independently reviewed sinus rhythm first-degree AV block Q waves are present in leads III and aVF consider preceding inferior infarction but acute ischemic changes.  The EKG is similar to that of 01/15/2016 however first-degree AV block is new Recent Labs: 09/14/2019: ALT 24; BUN 16; Creatinine, Ser 0.83; Hemoglobin 15.6; Platelets 363.0; Potassium 4.2; Sodium 140; TSH 1.73  Recent Lipid Panel    Component Value Date/Time   CHOL 169 02/19/2019 0904   TRIG 177.0 (H) 02/19/2019 0904   HDL 40.00 02/19/2019 0904   CHOLHDL 4 02/19/2019 0904   VLDL 35.4 02/19/2019 0904   LDLCALC 93 02/19/2019 0904   LDLDIRECT 179.0 09/14/2019 1218    Physical Exam:    VS:  BP (!) 158/102   Pulse 96   Ht 5\' 2"  (1.575 m)   Wt 225 lb (102.1 kg)   BMI 41.15 kg/m     Wt Readings from Last 3 Encounters:  09/15/19 225 lb (102.1 kg)  09/14/19 225 lb 3.2 oz (102.2 kg)  09/07/19 226 lb (102.5 kg)     GEN:  Well nourished, well developed in no acute distress HEENT: Normal NECK: No JVD; No carotid bruits LYMPHATICS: No lymphadenopathy CARDIAC: RRR, no murmurs, rubs, gallops RESPIRATORY:  Clear to auscultation  without rales, wheezing or rhonchi  ABDOMEN: Soft, non-tender, non-distended MUSCULOSKELETAL:  No edema; No deformity  SKIN: Warm and dry NEUROLOGIC:  Alert and oriented x 3 PSYCHIATRIC:  Normal affect     Signed, 11/05/19, MD  09/15/2019 11:04 AM    [facility]

## 2019-09-15 NOTE — ED Triage Notes (Addendum)
Pt here with chest pain since yesterday and was sent by cardiologist upstairs. Left side and sharp. Denies radiation. Pain is resolved.

## 2019-09-15 NOTE — Discharge Instructions (Signed)
If you develop recurrent, continued, or worsening chest pain, shortness of breath, fever, vomiting, abdominal or back pain, or any other new/concerning symptoms then return to the ER for evaluation.  

## 2019-09-15 NOTE — ED Provider Notes (Signed)
[facility] EMERGENCY DEPARTMENT Provider Note   CSN: [id number] Arrival date & time: 09/15/19  1119     History Chief Complaint  Patient presents with  . Chest Pain    Kathy [name] is a 64 y.o. female.  HPI 63 year old female sent in by her cardiologist for chest pain.  Developed chest pain yesterday morning at around 6:30 AM.  Started spontaneously.  She noticed sharp chest pain for about 10 or 15 minutes.  Ambulance was called and her blood pressure was 240/120.  Felt a little short of breath but was not sure if this was anxiety.  Felt a little clammy.  No nausea or vomiting.  Pain spontaneously resolved and has not had any recurrence since.  Saw her cardiologist today who recommended she come to the ER and asked for D-dimer as well as troponin. Since the chest pain patient states she's had some fatigue and mild headache.  Past Medical History:  Diagnosis Date  . Allergy   . Anxiety   . Arthritis    knees  . H/O seasonal allergies   . History of kidney stones   . Hyperlipidemia   . Hypertension   . Pneumonia   . Renal calculus, right   . Type 2 diabetes mellitus (HCC)   . Wears contact lenses     Patient Active Problem List   Diagnosis Date Noted  . Chest pain 09/14/2019  . Low back pain 05/06/2019  . UTI symptoms 05/06/2019  . Acute gout involving toe of right foot 09/01/2018  . Lichen simplex chronicus 06/18/2018  . Adjustment insomnia 06/18/2018  . Need for vaccination against Streptococcus pneumoniae using pneumococcal conjugate vaccine 13 06/18/2018  . Hand dermatitis 06/18/2018  . Cough 02/19/2018  . Vitamin D deficiency 02/06/2018  . Encounter for health maintenance examination with abnormal findings 02/06/2018  . Reactive airway disease 02/03/2018  . Elevated uric acid in blood 12/09/2017  . Morbid obesity (HCC) 12/09/2017  . Acute idiopathic gout involving toe of left foot 10/09/2017  . OA (osteoarthritis) of knee 10/09/2017  . Abdominal  pain, epigastric 01/15/2016  . Right nephrolithiasis 01/15/2016  . Diabetes mellitus without complication (HCC)   . Essential hypertension   . Mixed hyperlipidemia     Past Surgical History:  Procedure Laterality Date  . ABDOMINAL HYSTERECTOMY    . CHOLECYSTECTOMY N/A 01/16/2016   Procedure: LAPAROSCOPIC CHOLECYSTECTOMY WITH INTRAOPERATIVE CHOLANGIOGRAM;  Surgeon: Ovidio Kin, MD;  Location: [facility];  Service: General;  Laterality: N/A;  . CYSTOSCOPY WITH RETROGRADE PYELOGRAM, URETEROSCOPY AND STENT PLACEMENT Right 03/07/2016   Procedure: CYSTOSCOPY WITH RETROGRADE PYELOGRAM, URETEROSCOPY AND STENT PLACEMENT ;  Surgeon: Bjorn Pippin, MD;  Location: [facility];  Service: Urology;  Laterality: Right;  . CYSTOSCOPY WITH RETROGRADE PYELOGRAM, URETEROSCOPY AND STENT PLACEMENT Right 03/21/2016   Procedure: CYSTOSCOPY WITH RIGHT RETROGRADE PYELOGRAM, RIGHT URETEROSCOPY WITH LASER  AND STENT PLACEMENT;  Surgeon: Bjorn Pippin, MD;  Location: [facility];  Service: Urology;  Laterality: Right;  . HOLMIUM LASER APPLICATION N/A 03/21/2016   Procedure: HOLMIUM LASER APPLICATION;  Surgeon: Bjorn Pippin, MD;  Location: [facility];  Service: Urology;  Laterality: N/A;  . TOTAL ABDOMINAL HYSTERECTOMY W/ BILATERAL SALPINGOOPHORECTOMY  08/1996     OB History   No obstetric history on file.     Family History  Problem Relation Age of Onset  . Breast cancer Mother   . Aneurysm Father        brain aneurysm  . Kidney Stones Father  Social History   Tobacco Use  . Smoking status: Never Smoker  . Smokeless tobacco: Never Used  Substance Use Topics  . Alcohol use: Yes    Comment: rare  . Drug use: No    Home Medications Prior to Admission medications   Medication Sig Start Date End Date Taking? Authorizing Provider  allopurinol (ZYLOPRIM) 100 MG tablet Take 1 tablet (100 mg total) by mouth daily. 09/14/19   Libby Maw, MD  ALPRAZolam Duanne Moron) 1 MG tablet Take 0.5 tablets (0.5 mg total)  by mouth at bedtime as needed for sleep. 06/11/19   Libby Maw, MD  cloNIDine (CATAPRES) 0.1 MG tablet Take 1 tablet (0.1 mg total) by mouth 2 (two) times daily. 09/14/19   Libby Maw, MD  Colchicine (MITIGARE) 0.6 MG CAPS Take 1 capsule by mouth 2 (two) times daily. Patient taking differently: Take 1 capsule by mouth 2 (two) times daily as needed.  04/14/19   Libby Maw, MD  diltiazem [facility]) 360 MG 24 hr capsule Take 1 capsule (360 mg total) by mouth every morning. 09/14/19   Libby Maw, MD  levocetirizine (XYZAL) 5 MG tablet Take 5 mg by mouth every evening.    [provider]  lisinopril (ZESTRIL) 20 MG tablet Take 1 tablet (20 mg total) by mouth daily. 09/14/19   Libby Maw, MD  metFORMIN (GLUCOPHAGE-XR) 500 MG 24 hr tablet TAKE 2 TABLETS .BY MOUTH AT BEDTIME. 06/28/19   Libby Maw, MD  metoprolol succinate (TOPROL-XL) 50 MG 24 hr tablet Take 1 tablet (50 mg total) by mouth daily. Take with or immediately following a meal. 09/15/19 12/14/19  Richardo Priest, MD  traMADol (ULTRAM) 50 MG tablet Take one at night as needed for pain. 09/09/19   Thurman Coyer, DO  Vitamin D, Ergocalciferol, (DRISDOL) 1.25 MG (50000 UT) CAPS capsule Take 1 capsule (50,000 Units total) by mouth every 7 (seven) days. 06/11/19   Libby Maw, MD  WELCHOL 625 MG tablet TAKE 3 TABLETS BY MOUTH 2 TIMES DAILY WITH A MEAL. Patient taking differently: daily.  10/08/18   Libby Maw, MD    Allergies    Indomethacin, Statins, and Dexamethasone  Review of Systems   Review of Systems  Constitutional: Negative for fever.  Respiratory: Positive for shortness of breath. Negative for cough.   Cardiovascular: Positive for chest pain.  Neurological: Positive for headaches.  All other systems reviewed and are negative.   Physical Exam Updated Vital Signs BP (!) 159/88   Pulse 94   Temp 98.8 F (37.1 C) (Oral)   Resp 20   Ht 5\' 2"   (1.575 m)   Wt 103.6 kg   SpO2 97%   BMI 41.76 kg/m   Physical Exam Vitals and nursing note reviewed.  Constitutional:      General: She is not in acute distress.    Appearance: She is well-developed. She is not ill-appearing or diaphoretic.  HENT:     Head: Normocephalic and atraumatic.     Right Ear: External ear normal.     Left Ear: External ear normal.     Nose: Nose normal.  Eyes:     General:        Right eye: No discharge.        Left eye: No discharge.  Cardiovascular:     Rate and Rhythm: Normal rate and regular rhythm.     Heart sounds: Normal heart sounds.  Pulmonary:  Effort: Pulmonary effort is normal.     Breath sounds: Normal breath sounds.  Abdominal:     Palpations: Abdomen is soft.     Tenderness: There is no abdominal tenderness.  Skin:    General: Skin is warm and dry.  Neurological:     Mental Status: She is alert.  Psychiatric:        Mood and Affect: Mood is not anxious.     ED Results / Procedures / Treatments   Labs (all labs ordered are listed, but only abnormal results are displayed) Labs Reviewed  CBC WITH DIFFERENTIAL/PLATELET - Abnormal; Notable for the following components:      Result Value   HCT 46.3 (*)    All other components within normal limits  BASIC METABOLIC PANEL - Abnormal; Notable for the following components:   Glucose, Bld 107 (*)    All other components within normal limits  D-DIMER, QUANTITATIVE (NOT AT [facility])  TROPONIN I (HIGH SENSITIVITY)    EKG EKG Interpretation  Date/Time:  Wednesday September 15 2019 12:38:20 EST Ventricular Rate:  91 PR Interval:    QRS Duration: 85 QT Interval:  359 QTC Calculation: 442 R Axis:   23 Text Interpretation: Sinus rhythm no acute ST/T changes no significant change since June 2017 Confirmed by Pricilla Loveless [id number]) on 09/15/2019 12:43:14 PM   Radiology DG Chest 2 View  Result Date: 09/15/2019 CLINICAL DATA:  Chest pain and hypertension EXAM: CHEST - 2 VIEW  COMPARISON:  February 19, 2018 FINDINGS: The lungs are clear. The heart size and pulmonary vascularity are normal. No adenopathy. No pneumothorax. No bone lesions. IMPRESSION: No abnormality noted. Electronically Signed   By: Bretta Bang III M.D.   On: 09/15/2019 12:23    Procedures Procedures (including critical care time)  Medications Ordered in ED Medications - No data to display  ED Course  I have reviewed the triage vital signs and the nursing notes.  Pertinent labs & imaging results that were available during my care of the patient were reviewed by me and considered in my medical decision making (see chart for details).    MDM Rules/Calculators/A&P                      Patient's work-up is unremarkable.  ECG without acute ischemia.  Troponin negative.  Given symptoms were very brief and well over 12 hours ago I do not think repeat troponin testing is needed.  We discussed return precautions.  Her blood pressure is trending lower and now is 142/82.  Could have been hypertension driven but it is hard to say.  My suspicion this was a dissection is pretty low as I would expect her to continue to have symptoms.  Doubt PE.  Follow-up with cardiology. Final Clinical Impression(s) / ED Diagnoses Final diagnoses:  Nonspecific chest pain    Rx / DC Orders ED Discharge Orders    None       Pricilla Loveless, MD 09/15/19 1329

## 2019-09-17 ENCOUNTER — Ambulatory Visit: Payer: BC Managed Care – PPO | Attending: Internal Medicine

## 2019-09-17 DIAGNOSIS — Z23 Encounter for immunization: Secondary | ICD-10-CM

## 2019-09-17 NOTE — Progress Notes (Signed)
   Covid-19 Vaccination Clinic  Name:  DONYELLE ENYEART    MRN: [id number] DOB: [birth year]  09/17/2019  Ms. Mosher was observed post Covid-19 immunization for 15 minutes without incidence. She was provided with Vaccine Information Sheet and instruction to access the V-Safe system.   Ms. Mayberry was instructed to call 911 with any severe reactions post vaccine: Marland Kitchen Difficulty breathing  . Swelling of your face and throat  . A fast heartbeat  . A bad rash all over your body  . Dizziness and weakness    Immunizations Administered    Name Date Dose VIS Date Route   Pfizer COVID-19 Vaccine 09/17/2019  2:52 PM 0.3 mL 07/16/2019 Intramuscular   Manufacturer: [employer], [employer]   Lot: [id number]   NDC: [address]-1

## 2019-09-21 ENCOUNTER — Encounter: Payer: Self-pay | Admitting: Family Medicine

## 2019-09-21 ENCOUNTER — Telehealth (HOSPITAL_COMMUNITY): Payer: Self-pay

## 2019-09-21 NOTE — Telephone Encounter (Signed)
Encounter complete. 

## 2019-09-22 ENCOUNTER — Ambulatory Visit: Payer: BC Managed Care – PPO | Admitting: Family Medicine

## 2019-09-22 ENCOUNTER — Other Ambulatory Visit: Payer: Self-pay | Admitting: Family Medicine

## 2019-09-22 MED ORDER — HYDROCODONE-ACETAMINOPHEN 5-325 MG PO TABS: 1.0000 | ORAL_TABLET | Freq: Three times a day (TID) | ORAL | 0 refills | Status: DC | PRN

## 2019-09-22 NOTE — Progress Notes (Signed)
Provided norco for pain.   Myra Rude, MD [facility] 09/22/2019, 4:14 PM

## 2019-09-23 ENCOUNTER — Other Ambulatory Visit: Payer: Self-pay | Admitting: Family Medicine

## 2019-09-23 ENCOUNTER — Ambulatory Visit: Payer: BC Managed Care – PPO | Admitting: Family Medicine

## 2019-09-23 ENCOUNTER — Ambulatory Visit (HOSPITAL_COMMUNITY)
Admission: RE | Admit: 2019-09-23 | Payer: BC Managed Care – PPO | Source: Ambulatory Visit | Attending: Cardiology | Admitting: Cardiology

## 2019-09-23 DIAGNOSIS — E559 Vitamin D deficiency, unspecified: Secondary | ICD-10-CM

## 2019-09-24 ENCOUNTER — Ambulatory Visit (HOSPITAL_COMMUNITY): Payer: BC Managed Care – PPO

## 2019-10-01 ENCOUNTER — Telehealth (HOSPITAL_COMMUNITY): Payer: Self-pay

## 2019-10-01 NOTE — Telephone Encounter (Signed)
Encounter complete. 

## 2019-10-04 ENCOUNTER — Encounter: Payer: Self-pay | Admitting: Family Medicine

## 2019-10-05 ENCOUNTER — Other Ambulatory Visit: Payer: Self-pay

## 2019-10-05 ENCOUNTER — Ambulatory Visit: Payer: BC Managed Care – PPO | Admitting: Family Medicine

## 2019-10-05 DIAGNOSIS — E782 Mixed hyperlipidemia: Secondary | ICD-10-CM

## 2019-10-05 MED ORDER — COLESEVELAM HCL 625 MG PO TABS: ORAL_TABLET | ORAL | 0 refills | Status: DC

## 2019-10-06 ENCOUNTER — Ambulatory Visit (HOSPITAL_COMMUNITY)
Admission: RE | Admit: 2019-10-06 | Discharge: 2019-10-06 | Disposition: A | Discharge status: Home / Self Care | Payer: BC Managed Care – PPO | Source: Ambulatory Visit | Attending: Internal Medicine | Admitting: Internal Medicine

## 2019-10-06 ENCOUNTER — Other Ambulatory Visit: Payer: Self-pay | Admitting: Family Medicine

## 2019-10-06 ENCOUNTER — Other Ambulatory Visit: Payer: Self-pay

## 2019-10-06 DIAGNOSIS — I10 Essential (primary) hypertension: Secondary | ICD-10-CM

## 2019-10-06 DIAGNOSIS — R079 Chest pain, unspecified: Secondary | ICD-10-CM | POA: Insufficient documentation

## 2019-10-06 MED ORDER — REGADENOSON 0.4 MG/5ML IV SOLN
0.4000 mg | Freq: Once | INTRAVENOUS | Status: AC
  Administered 2019-10-06: 0.4 mg via INTRAVENOUS

## 2019-10-06 MED ORDER — TECHNETIUM TC 99M TETROFOSMIN IV KIT
30.1000 | PACK | Freq: Once | INTRAVENOUS | Status: DC | PRN
  Filled 2019-10-06: qty 31

## 2019-10-06 MED ORDER — REGADENOSON 0.4 MG/5ML IV SOLN: 0.4000 mg | Freq: Once | INTRAVENOUS | Status: DC

## 2019-10-06 MED ORDER — TECHNETIUM TC 99M TETROFOSMIN IV KIT
30.1000 | PACK | Freq: Once | INTRAVENOUS | Status: AC | PRN
  Administered 2019-10-06: 30.1 via INTRAVENOUS
  Filled 2019-10-06: qty 31

## 2019-10-07 ENCOUNTER — Ambulatory Visit (HOSPITAL_COMMUNITY)
Admission: RE | Admit: 2019-10-07 | Discharge: 2019-10-07 | Disposition: A | Discharge status: Home / Self Care | Payer: BC Managed Care – PPO | Source: Ambulatory Visit | Attending: Cardiology | Admitting: Cardiology

## 2019-10-07 LAB — MYOCARDIAL PERFUSION IMAGING
LV dias vol: 104 mL (ref 46–106)
LV sys vol: 38 mL
Peak HR: 87 {beats}/min
Rest HR: 58 {beats}/min
SDS: 2
SRS: 1
SSS: 3
TID: 0.94

## 2019-10-07 MED ORDER — TECHNETIUM TC 99M TETROFOSMIN IV KIT
31.4000 | PACK | Freq: Once | INTRAVENOUS | Status: AC | PRN
  Administered 2019-10-07: 31.4 via INTRAVENOUS

## 2019-10-08 ENCOUNTER — Telehealth: Payer: Self-pay | Admitting: Emergency Medicine

## 2019-10-08 NOTE — Telephone Encounter (Signed)
Left message for patient to return call regarding results and needs to be scheduled for a follow up with Dr. Dulce Sellar.

## 2019-10-10 ENCOUNTER — Ambulatory Visit: Payer: BC Managed Care – PPO | Attending: Internal Medicine

## 2019-10-10 ENCOUNTER — Other Ambulatory Visit: Payer: Self-pay

## 2019-10-10 DIAGNOSIS — Z23 Encounter for immunization: Secondary | ICD-10-CM | POA: Insufficient documentation

## 2019-10-10 NOTE — Progress Notes (Signed)
   Covid-19 Vaccination Clinic  Name:  Kathy [name]    MRN: [id number] DOB: [birth year]  10/10/2019  Kathy [name] was observed post Covid-19 immunization for 15 minutes without incident. She was provided with Vaccine Information Sheet and instruction to access the V-Safe system.   Kathy [name] was instructed to call 911 with any severe reactions post vaccine: Marland Kitchen Difficulty breathing  . Swelling of face and throat  . A fast heartbeat  . A bad rash all over body  . Dizziness and weakness   Immunizations Administered    Name Date Dose VIS Date Route   Pfizer COVID-19 Vaccine 10/10/2019 11:44 AM 0.3 mL 07/16/2019 Intramuscular   Manufacturer: [employer], [employer]   Lot: [id number]   NDC: [address]-0

## 2019-10-12 ENCOUNTER — Encounter: Payer: Self-pay | Admitting: Family Medicine

## 2019-10-12 ENCOUNTER — Ambulatory Visit (HOSPITAL_BASED_OUTPATIENT_CLINIC_OR_DEPARTMENT_OTHER)
Admission: RE | Admit: 2019-10-12 | Discharge: 2019-10-12 | Disposition: A | Discharge status: Home / Self Care | Payer: BC Managed Care – PPO | Source: Ambulatory Visit | Attending: Family Medicine | Admitting: Family Medicine

## 2019-10-12 ENCOUNTER — Other Ambulatory Visit: Payer: Self-pay

## 2019-10-12 ENCOUNTER — Telehealth: Payer: Self-pay | Admitting: Family Medicine

## 2019-10-12 ENCOUNTER — Ambulatory Visit: Payer: BC Managed Care – PPO | Admitting: Family Medicine

## 2019-10-12 VITALS — BP 188/105 | HR 61 | Ht 62.0 in | Wt 226.0 lb

## 2019-10-12 DIAGNOSIS — M1711 Unilateral primary osteoarthritis, right knee: Secondary | ICD-10-CM | POA: Diagnosis not present

## 2019-10-12 DIAGNOSIS — M25551 Pain in right hip: Secondary | ICD-10-CM | POA: Diagnosis not present

## 2019-10-12 NOTE — Assessment & Plan Note (Signed)
Little improvement thus far.  She does have a history of gout but her level most recently was 5.9 and had no improvement with recent colchicine use.  She does lack hip flexion on the left which could contribute to worsening torsion or strain on the right knee.  She did get an improvement with the steroid injection but only lasted a few weeks. -Placed in green sport insoles with small scaphoid pads. -Hip x-rays. -Could consider bracing -If no improvement can consider MRI

## 2019-10-12 NOTE — Progress Notes (Signed)
Kathy [name] - 64 y.o. female MRN [id number]  Date of birth: July 29, 1956  SUBJECTIVE:  Including CC & ROS.  Chief Complaint  Patient presents with  . Follow-up    follow up for right knee    Kathy [name] is a 64 y.o. female that is following up for her right knee pain.  We have tried steroid injection as well as gel injection.  Pain is still ongoing and is occurring over the medial joint line.     Review of Systems See HPI   HISTORY: Past Medical, Surgical, Social, and Family History Reviewed & Updated per EMR.   Pertinent Historical Findings include:  Past Medical History:  Diagnosis Date  . Allergy   . Anxiety   . Arthritis    knees  . H/O seasonal allergies   . History of kidney stones   . Hyperlipidemia   . Hypertension   . Pneumonia   . Renal calculus, right   . Type 2 diabetes mellitus ([place])   . Wears contact lenses     Past Surgical History:  Procedure Laterality Date  . ABDOMINAL HYSTERECTOMY    . CHOLECYSTECTOMY N/A 01/16/2016   Procedure: LAPAROSCOPIC CHOLECYSTECTOMY WITH INTRAOPERATIVE CHOLANGIOGRAM;  Surgeon: Alphonsa Overall, MD;  Location: [facility];  Service: General;  Laterality: N/A;  . CYSTOSCOPY WITH RETROGRADE PYELOGRAM, URETEROSCOPY AND STENT PLACEMENT Right 03/07/2016   Procedure: CYSTOSCOPY WITH RETROGRADE PYELOGRAM, URETEROSCOPY AND STENT PLACEMENT ;  Surgeon: Irine Seal, MD;  Location: [facility];  Service: Urology;  Laterality: Right;  . CYSTOSCOPY WITH RETROGRADE PYELOGRAM, URETEROSCOPY AND STENT PLACEMENT Right 03/21/2016   Procedure: CYSTOSCOPY WITH RIGHT RETROGRADE PYELOGRAM, RIGHT URETEROSCOPY WITH LASER  AND STENT PLACEMENT;  Surgeon: Irine Seal, MD;  Location: [facility];  Service: Urology;  Laterality: Right;  . HOLMIUM LASER APPLICATION N/A 2/68/3419   Procedure: HOLMIUM LASER APPLICATION;  Surgeon: Irine Seal, MD;  Location: [facility];  Service: Urology;  Laterality: N/A;  . TOTAL ABDOMINAL HYSTERECTOMY W/ BILATERAL  SALPINGOOPHORECTOMY  08/1996    Family History  Problem Relation Age of Onset  . Breast cancer Mother   . Aneurysm Father        brain aneurysm  . Kidney Stones Father     Social History   Socioeconomic History  . Marital status: Married    Spouse name: Not on file  . Number of children: Not on file  . Years of education: Not on file  . Highest education level: Not on file  Occupational History  . Not on file  Tobacco Use  . Smoking status: Never Smoker  . Smokeless tobacco: Never Used  Substance and Sexual Activity  . Alcohol use: Yes    Comment: rare  . Drug use: No  . Sexual activity: Yes    Birth control/protection: Surgical  Other Topics Concern  . Not on file  Social History Narrative  . Not on file   Social Determinants of Health   Financial Resource Strain:   . Difficulty of Paying Living Expenses: Not on file  Food Insecurity:   . Worried About Charity fundraiser in the Last Year: Not on file  . Ran Out of Food in the Last Year: Not on file  Transportation Needs:   . Lack of Transportation (Medical): Not on file  . Lack of Transportation (Non-Medical): Not on file  Physical Activity:   . Days of Exercise per Week: Not on file  . Minutes of Exercise per Session: Not on  file  Stress:   . Feeling of Stress : Not on file  Social Connections:   . Frequency of Communication with Friends and Family: Not on file  . Frequency of Social Gatherings with Friends and Family: Not on file  . Attends Religious Services: Not on file  . Active Member of Clubs or Organizations: Not on file  . Attends Banker Meetings: Not on file  . Marital Status: Not on file  Intimate Partner Violence:   . Fear of Current or Ex-Partner: Not on file  . Emotionally Abused: Not on file  . Physically Abused: Not on file  . Sexually Abused: Not on file     PHYSICAL EXAM:  VS: BP (!) 188/105   Pulse 61   Ht 5\' 2"  (1.575 m)   Wt 226 lb (102.5 kg)   BMI 41.34 kg/m    Physical Exam Gen: NAD, alert, cooperative with exam, well-appearing MSK:  Right knee: Tenderness palpation of the medial joint line. Normal range of motion. Neurovascularly intact     ASSESSMENT & PLAN:   OA (osteoarthritis) of knee Little improvement thus far.  She does have a history of gout but her level most recently was 5.9 and had no improvement with recent colchicine use.  She does lack hip flexion on the left which could contribute to worsening torsion or strain on the right knee.  She did get an improvement with the steroid injection but only lasted a few weeks. -Placed in green sport insoles with small scaphoid pads. -Hip x-rays. -Could consider bracing -If no improvement can consider MRI

## 2019-10-12 NOTE — Patient Instructions (Signed)
Good to see you Please try the brace  Please try the insoles  I will call with the results   Please send me a message in MyChart with any questions or updates.  Please see me back in 6-8 weeks.   --Dr. Jordan Likes

## 2019-10-12 NOTE — Telephone Encounter (Signed)
Informed of results.   Myra Rude, MD [facility] 10/12/2019, 4:29 PM

## 2019-10-13 ENCOUNTER — Encounter: Payer: Self-pay | Admitting: Family Medicine

## 2019-10-13 NOTE — Progress Notes (Signed)
Cardiology Office Note:    Date:  10/14/2019   ID:  Kathy [name], DOB 1956-01-04, MRN [id number]  PCP:  Kathy Maw, MD  Cardiologist:  Kathy More, MD    Referring MD: Kathy [name],*    ASSESSMENT:    1. Essential hypertension   2. Chest pain of uncertain etiology   3. Mixed hyperlipidemia   4. Diabetes mellitus without complication ([place])    PLAN:    In order of problems listed above:  1. Predominant problem is worsened and resistant hypertension.  See discussion below further evaluation therapeutic changes and follow-up and if not at target she will need a sleep study.  Check labs today including renal function aldosterone renal level and check renal vascular duplex that she is at risk for fibromuscular dysplasia monitor BP at home and was given instructions in proper technique 2. Stable normal myocardial perfusion study 3. Continue lipid-lowering treatment 4. Managed by her PCP   Next appointment: 6 weeks   Medication Adjustments/Labs and Tests Ordered: Current medicines are reviewed at length with the patient today.  Concerns regarding medicines are outlined above.  Orders Placed This Encounter  Procedures  . Basic metabolic panel  . Aldosterone + renin activity w/ ratio  . VAS US RENAL ARTERY DUPLEX   Meds ordered this encounter  Medications  . spironolactone (ALDACTONE) 25 MG tablet    Sig: Take 1 tablet (25 mg total) by mouth daily.    Dispense:  90 tablet    Refill:  3  . carvedilol (COREG) 12.5 MG tablet    Sig: Take 1 tablet (12.5 mg total) by mouth 2 (two) times daily.    Dispense:  180 tablet    Refill:  3  . telmisartan (MICARDIS) 40 MG tablet    Sig: Take 1 tablet (40 mg total) by mouth daily.    Dispense:  90 tablet    Refill:  3  . cloNIDine (CATAPRES) 0.1 MG tablet    Sig: Take 2 tablets (0.2 mg total) by mouth at bedtime.    Dispense:  180 tablet    Refill:  3    Chief Complaint  Patient presents with  .  Follow-up    For myocardial perfusion study performed for  . Chest Pain    History of Present Illness:    Kathy [name] is a 64 y.o. female with a hx of lipidemia and chest pain last seen 09/15/2019. Compliance with diet, lifestyle and medications: Yes  He is reassured by the Myoview findings she is not having typical angina.  Unfortunate hypertension has become very refractory to treatment Home blood pressures run greater than 170 greater than 110 and by me in the office today she is 190/104.  She is on clonidine and is having sedation will switch to bedtime is at risk for sleep apnea may radiate need a sleep study.  To achieve control will add distal diuretic spironolactone switch from metoprolol to carvedilol potent antihypertensive and to switch from lisinopril to a potent ARB.  To mitigate fatigue she will take both doses of clonidine at bedtime she will continue to follow blood pressure at home and she will be evaluated for aldosterone excess with an aldosterone renal level and for renal artery fibromuscular dysplasia she is from [place] with a renal vascular duplex.  No edema shortness of breath anginal discomfort palpitation or syncope.  This time I do not think she requires coronary angiography  Myoview, 10/07/2019: Stress Findings  ECG Baseline  ECG exhibits normal sinus rhythm.Baseline ECG indicates non-specific ST-T wave changes. .  Stress Findings A pharmacological stress test was performed using IV Lexiscan 0.4mg  over 10 seconds performed without concurrent submaximal exercise.   The patient reported shortness of breath during the stress test. Patient also reported feeling light-headed and jittery.   Test was stopped per protocol.  Response to Stress There was no ST segment deviation noted during stress.  Arrhythmias during stress:  none.   Arrhythmias during recovery:  none.     There were no significant arrhythmias noted during the test.   ECG was interpretable and there  was no significant change from baseline.  Nuclear Stress Findings  Isotope administration Rest isotope was administered  with an IV injection of 31.4 mCi Tc65m Tetrofosmin.  Rest SPECT images were obtained approximately 45 minutes post tracer injection.  Stress isotope was administered  with an IV injection of 30.1 mCi Tc56m Tetrofosmin   Stress SPECT images were obtained approximately 60 minutes post tracer injection.  Nuclear Study Quality Overall image quality is good.  Breast attenuation artifact was present.  Nuclear Measurements Study was gated.  Rest Perfusion Rest perfusion normal.  Stress Perfusion Stress perfusion normal.  Overall Study Impression Myocardial perfusion is normal.   The study is normal.   This is a low risk study.  Overall left ventricular systolic function was normal.    LV cavity size is normal.  Nuclear stress EF:  63%.  The left ventricular ejection fraction is normal (55-65%). There is no prior study for comparison.   Past Medical History:  Diagnosis Date  . Allergy   . Anxiety   . Arthritis    knees  . H/O seasonal allergies   . History of kidney stones   . Hyperlipidemia   . Hypertension   . Pneumonia   . Renal calculus, right   . Type 2 diabetes mellitus (H[facility])   . Wears contact lenses     Past Surgical History:  Procedure Laterality Date  . ABDOMINAL HYSTERECTOMY    . CHOLECYSTECTOMY N/A 01/16/2016   Procedure: LAPAROSCOPIC CHOLECYSTECTOMY WITH INTRAOPERATIVE CHOLANGIOGRAM;  Surgeon: Ovidio Kin, MD;  Location: [facility];  Service: General;  Laterality: N/A;  . CYSTOSCOPY WITH RETROGRADE PYELOGRAM, URETEROSCOPY AND STENT PLACEMENT Right 03/07/2016   Procedure: CYSTOSCOPY WITH RETROGRADE PYELOGRAM, URETEROSCOPY AND STENT PLACEMENT ;  Surgeon: Bjorn Pippin, MD;  [facility]: [facility];  Service: Urology;  Laterality: Right;  . CYSTOSCOPY WITH RETROGRADE PYELOGRAM, URETEROSCOPY AND STENT PLACEMENT Right 03/21/2016   Procedure: CYSTOSCOPY WITH  RIGHT RETROGRADE PYELOGRAM, RIGHT URETEROSCOPY WITH LASER  AND STENT PLACEMENT;  Surgeon: Bjorn Pippin, MD;  Location: [facility];  Service: Urology;  Laterality: Right;  . HOLMIUM LASER APPLICATION N/A 03/21/2016   Procedure: HOLMIUM LASER APPLICATION;  Surgeon: Bjorn Pippin, MD;  Location: [facility];  Service: Urology;  Laterality: N/A;  . TOTAL ABDOMINAL HYSTERECTOMY W/ BILATERAL SALPINGOOPHORECTOMY  08/1996    Current Medications: Current Meds  Medication Sig  . allopurinol (ZYLOPRIM) 100 MG tablet Take 1 tablet (100 mg total) by mouth daily.  Marland Kitchen ALPRAZolam (XANAX) 1 MG tablet Take 0.5 tablets (0.5 mg total) by mouth at bedtime as needed for sleep.  . cloNIDine (CATAPRES) 0.1 MG tablet Take 2 tablets (0.2 mg total) by mouth at bedtime.  . Colchicine (MITIGARE) 0.6 MG CAPS Take 1 capsule by mouth 2 (two) times daily. (Patient taking differently: Take 1 capsule by mouth 2 (two) times daily as needed. )  . colesevelam [facility])  625 MG tablet Take 3bid (Plz sched visit for future fills)  . diltiazem (TIAZAC) 360 MG 24 hr capsule Take 1 capsule (360 mg total) by mouth every morning.  Marland Kitchen HYDROcodone-acetaminophen (NORCO/VICODIN) 5-325 MG tablet Take 1 tablet by mouth every 8 (eight) hours as needed for moderate pain.  Marland Kitchen levocetirizine (XYZAL) 5 MG tablet Take 5 mg by mouth every evening.  . metFORMIN (GLUCOPHAGE-XR) 500 MG 24 hr tablet TAKE 2 TABLETS .BY MOUTH AT BEDTIME.  Marland Kitchen Vitamin D, Ergocalciferol, (DRISDOL) 1.25 MG (50000 UNIT) CAPS capsule TAKE 1 CAPSULE (50,000 UNITS TOTAL) BY MOUTH EVERY 7 (SEVEN) DAYS.  . [DISCONTINUED] cloNIDine (CATAPRES) 0.1 MG tablet Take 1bid (Plz sched visit for future fills)  . [DISCONTINUED] lisinopril (ZESTRIL) 20 MG tablet Take 1 tablet (20 mg total) by mouth daily.  . [DISCONTINUED] metoprolol succinate (TOPROL-XL) 50 MG 24 hr tablet Take 1 tablet (50 mg total) by mouth daily. Take with or immediately following a meal.     Allergies:   Indomethacin, Statins, and  Dexamethasone   Social History   Socioeconomic History  . Marital status: Married    Spouse name: Not on file  . Number of children: Not on file  . Years of education: Not on file  . Highest education level: Not on file  Occupational History  . Not on file  Tobacco Use  . Smoking status: Never Smoker  . Smokeless tobacco: Never Used  Substance and Sexual Activity  . Alcohol use: Yes    Comment: rare  . Drug use: No  . Sexual activity: Yes    Birth control/protection: Surgical  Other Topics Concern  . Not on file  Social History Narrative  . Not on file   Social Determinants of Health   Financial Resource Strain:   . Difficulty of Paying Living Expenses:   Food Insecurity:   . Worried About Programme researcher, broadcasting/film/video in the Last Year:   . Barista in the Last Year:   Transportation Needs:   . Freight forwarder (Medical):   Marland Kitchen Lack of Transportation (Non-Medical):   Physical Activity:   . Days of Exercise per Week:   . Minutes of Exercise per Session:   Stress:   . Feeling of Stress :   Social Connections:   . Frequency of Communication with Friends and Family:   . Frequency of Social Gatherings with Friends and Family:   . Attends Religious Services:   . Active Member of Clubs or Organizations:   . Attends Banker Meetings:   Marland Kitchen Marital Status:      Family History: The patient's family history includes Aneurysm in her father; Breast cancer in her mother; Kidney Stones in her father. ROS:   Please see the history of present illness.    All other systems reviewed and are negative.  EKGs/Labs/Other Studies Reviewed:    The following studies were reviewed today:  She has normal renal function  Recent Labs: 09/14/2019: ALT 24; TSH 1.73 09/15/2019: BUN 22; Creatinine, Ser 0.94; Hemoglobin 15.0; Platelets 366; Potassium 4.1; Sodium 138  Recent Lipid Panel    Component Value Date/Time   CHOL 169 02/19/2019 0904   TRIG 177.0 (H) 02/19/2019 0904     HDL 40.00 02/19/2019 0904   CHOLHDL 4 02/19/2019 0904   VLDL 35.4 02/19/2019 0904   LDLCALC 93 02/19/2019 0904   LDLDIRECT 179.0 09/14/2019 1218    Physical Exam:    VS:  BP (!) 176/98   Pulse  79   Temp 97.9 F (36.6 C)   Ht 5\' 2"  (1.575 m)   Wt 229 lb (103.9 kg)   SpO2 97%   BMI 41.88 kg/m     Wt Readings from Last 3 Encounters:  10/14/19 229 lb (103.9 kg)  10/12/19 226 lb (102.5 kg)  10/06/19 228 lb (103.4 kg)     GEN: Obese BMI greater than 40 well nourished, well developed in no acute distress HEENT: Normal NECK: No JVD; No carotid bruits LYMPHATICS: No lymphadenopathy CARDIAC: RRR, no murmurs, rubs, gallops RESPIRATORY:  Clear to auscultation without rales, wheezing or rhonchi  ABDOMEN: Soft, non-tender, non-distended MUSCULOSKELETAL:  No edema; No deformity  SKIN: Warm and dry NEUROLOGIC:  Alert and oriented x 3 PSYCHIATRIC:  Normal affect    Signed, 12/06/19, MD  10/14/2019 9:47 AM    [facility]

## 2019-10-14 ENCOUNTER — Encounter: Payer: Self-pay | Admitting: Cardiology

## 2019-10-14 ENCOUNTER — Other Ambulatory Visit: Payer: Self-pay

## 2019-10-14 ENCOUNTER — Ambulatory Visit: Payer: BC Managed Care – PPO | Admitting: Cardiology

## 2019-10-14 VITALS — BP 176/98 | HR 79 | Temp 97.9°F | Ht 62.0 in | Wt 229.0 lb

## 2019-10-14 DIAGNOSIS — R079 Chest pain, unspecified: Secondary | ICD-10-CM

## 2019-10-14 DIAGNOSIS — I10 Essential (primary) hypertension: Secondary | ICD-10-CM | POA: Diagnosis not present

## 2019-10-14 DIAGNOSIS — E119 Type 2 diabetes mellitus without complications: Secondary | ICD-10-CM

## 2019-10-14 DIAGNOSIS — E782 Mixed hyperlipidemia: Secondary | ICD-10-CM | POA: Diagnosis not present

## 2019-10-14 MED ORDER — SPIRONOLACTONE 25 MG PO TABS: 25.0000 mg | ORAL_TABLET | Freq: Every day | ORAL | 3 refills | Status: DC

## 2019-10-14 MED ORDER — TELMISARTAN 40 MG PO TABS: 40.0000 mg | ORAL_TABLET | Freq: Every day | ORAL | 3 refills | Status: DC

## 2019-10-14 MED ORDER — CLONIDINE HCL 0.1 MG PO TABS: 0.2000 mg | ORAL_TABLET | Freq: Every day | ORAL | 3 refills | Status: DC

## 2019-10-14 MED ORDER — CARVEDILOL 12.5 MG PO TABS: 12.5000 mg | ORAL_TABLET | Freq: Two times a day (BID) | ORAL | 3 refills | Status: DC

## 2019-10-14 NOTE — Patient Instructions (Addendum)
Medication Instructions:  Your physician has recommended you make the following change in your medication:   1. STOP: metoprolol succinate (Toprol-XL)  2. STOP: lisinopril  3. START: spironolactone 25 mg tablet: Take 1 tablet by mouth once a day  4. START: carvedilol (coreg) 12.5 mg tablet: Take 1 tablet by mouth twice a day  5. START: temisartan 40 mg tablet: Take 1 tablet by mouth once a day  5. CHANGE: clonidine 0.1 mg tablet: Take 2 tablets (0.2 mg) at night *If you need a refill on your cardiac medications before your next appointment, please call your pharmacy*   Lab Work: TODAY: BMET, Aldonsterone/Renin If you have labs (blood work) drawn today and your tests are completely normal, you will receive your results only by: Marland Kitchen MyChart Message (if you have MyChart) OR . A paper copy in the mail If you have any lab test that is abnormal or we need to change your treatment, we will call you to review the results.   Testing/Procedures: Your physician has requested that you have a renal artery duplex. During this test, an ultrasound is used to evaluate blood flow to the kidneys. Allow one hour for this exam. Do not eat after midnight the day before and avoid carbonated beverages. Take your medications as you usually do.   Follow-Up: At [facility], you and your health needs are our priority.  As part of our continuing mission to provide you with exceptional heart care, we have created designated Provider Care Teams.  These Care Teams include your primary Cardiologist (physician) and Advanced Practice Providers (APPs -  Physician Assistants and Nurse Practitioners) who all work together to provide you with the care you need, when you need it.  We recommend signing up for the patient portal called "MyChart".  Sign up information is provided on this After Visit Summary.  MyChart is used to connect with patients for Virtual Visits (Telemedicine).  Patients are able to view lab/test  results, encounter notes, upcoming appointments, etc.  Non-urgent messages can be sent to your provider as well.   To learn more about what you can do with MyChart, go to [email or web address].    Your next appointment:   4 week(s)  The format for your next appointment:   In Person  Provider:   Norman Herrlich, MD   Other Instructions  [facility]beat  Tips to measure your blood pressure correctly  To determine whether you have hypertension, a medical professional will take a blood pressure reading. How you prepare for the test, the position of your arm, and other factors can change a blood pressure reading by 10% or more. That could be enough to hide high blood pressure, start you on a drug you don't really need, or lead your doctor to incorrectly adjust your medications. National and international guidelines offer specific instructions for measuring blood pressure. If a doctor, nurse, or medical assistant isn't doing it right, don't hesitate to ask him or her to get with the guidelines. Here's what you can do to ensure a correct reading: . Don't drink a caffeinated beverage or smoke during the 30 minutes before the test. . Sit quietly for five minutes before the test begins. . During the measurement, sit in a chair with your feet on the floor and your arm supported so your elbow is at about heart level. . The inflatable part of the cuff should completely cover at least 80% of your upper arm, and the cuff should be placed on bare skin, not  over a shirt. . Don't talk during the measurement. . Have your blood pressure measured twice, with a brief break in between. If the readings are different by 5 points or more, have it done a third time. There are times to break these rules. If you sometimes feel lightheaded when getting out of bed in the morning or when you stand after sitting, you should have your blood pressure checked while seated and then while standing to see if it falls from one  position to the next. Because blood pressure varies throughout the day, your doctor will rarely diagnose hypertension on the basis of a single reading. Instead, he or she will want to confirm the measurements on at least two occasions, usually within a few weeks of one another. The exception to this rule is if you have a blood pressure reading of 180/110 mm Hg or higher. A result this high usually calls for prompt treatment. It's also a good idea to have your blood pressure measured in both arms at least once, since the reading in one arm (usually the right) may be higher than that in the left. A 2014 study in The American Journal of Medicine of nearly 3,400 people found average arm- to-arm differences in systolic blood pressure of about 5 points. The higher number should be used to make treatment decisions. In 2017, new guidelines from the [place], the [employer] of Cardiology, and nine other health organizations lowered the diagnosis of high blood pressure to 130/80 mm Hg or higher for all adults. The guidelines also redefined the various blood pressure categories to now include normal, elevated, Stage 1 hypertension, Stage 2 hypertension, and hypertensive crisis (see "Blood pressure categories"). Blood pressure categories  Blood pressure category SYSTOLIC (upper number)  DIASTOLIC (lower number)  Normal Less than 120 mm Hg and Less than 80 mm Hg  Elevated 120-129 mm Hg and Less than 80 mm Hg  High blood pressure: Stage 1 hypertension 130-139 mm Hg or 80-89 mm Hg  High blood pressure: Stage 2 hypertension 140 mm Hg or higher or 90 mm Hg or higher  Hypertensive crisis (consult your doctor immediately) Higher than 180 mm Hg and/or Higher than 120 mm Hg  Source: American Heart Association and American Stroke Association. For more on getting your blood pressure under control, buy Controlling Your Blood Pressure, a Special Health Report from [facility].

## 2019-10-20 ENCOUNTER — Ambulatory Visit (HOSPITAL_BASED_OUTPATIENT_CLINIC_OR_DEPARTMENT_OTHER): Payer: BC Managed Care – PPO

## 2019-10-21 ENCOUNTER — Other Ambulatory Visit: Payer: Self-pay | Admitting: Family Medicine

## 2019-10-21 MED ORDER — HYDROCODONE-ACETAMINOPHEN 5-325 MG PO TABS: 1.0000 | ORAL_TABLET | Freq: Three times a day (TID) | ORAL | 0 refills | Status: DC | PRN

## 2019-10-21 NOTE — Progress Notes (Signed)
Provided norco.   Myra Rude, MD [facility] 10/21/2019, 3:29 PM

## 2019-10-22 LAB — ALDOSTERONE + RENIN ACTIVITY W/ RATIO
ALDOS/RENIN RATIO: 10.8 (ref 0.0–30.0)
ALDOSTERONE: 5.1 ng/dL (ref 0.0–30.0)
Renin: 0.472 ng/mL/hr (ref 0.167–5.380)

## 2019-10-22 LAB — BASIC METABOLIC PANEL
BUN/Creatinine Ratio: 15 (ref 12–28)
BUN: 16 mg/dL (ref 8–27)
CO2: 22 mmol/L (ref 20–29)
Calcium: 10.3 mg/dL (ref 8.7–10.3)
Chloride: 102 mmol/L (ref 96–106)
Creatinine, Ser: 1.07 mg/dL — ABNORMAL HIGH (ref 0.57–1.00)
GFR calc Af Amer: 63 mL/min/{1.73_m2} (ref >59)
GFR calc non Af Amer: 55 mL/min/{1.73_m2} — ABNORMAL LOW (ref >59)
Glucose: 150 mg/dL — ABNORMAL HIGH (ref 65–99)
Potassium: 4.4 mmol/L (ref 3.5–5.2)
Sodium: 142 mmol/L (ref 134–144)

## 2019-10-24 ENCOUNTER — Encounter: Payer: Self-pay | Admitting: Family Medicine

## 2019-10-25 ENCOUNTER — Other Ambulatory Visit: Payer: Self-pay | Admitting: Family Medicine

## 2019-10-25 DIAGNOSIS — M1711 Unilateral primary osteoarthritis, right knee: Secondary | ICD-10-CM

## 2019-10-27 NOTE — Addendum Note (Signed)
Addended by: Kathi Simpers F on: 10/27/2019 01:44 PM   Modules accepted: Orders

## 2019-10-27 NOTE — Addendum Note (Signed)
Addended by: Kathi Simpers F on: 10/27/2019 11:19 AM   Modules accepted: Orders

## 2019-11-08 ENCOUNTER — Ambulatory Visit (INDEPENDENT_AMBULATORY_CARE_PROVIDER_SITE_OTHER): Payer: BC Managed Care – PPO | Admitting: Family Medicine

## 2019-11-08 ENCOUNTER — Other Ambulatory Visit: Payer: Self-pay

## 2019-11-08 ENCOUNTER — Ambulatory Visit (HOSPITAL_BASED_OUTPATIENT_CLINIC_OR_DEPARTMENT_OTHER)
Admission: RE | Admit: 2019-11-08 | Discharge: 2019-11-08 | Disposition: A | Discharge status: Home / Self Care | Payer: BC Managed Care – PPO | Source: Ambulatory Visit | Attending: Cardiology | Admitting: Cardiology

## 2019-11-08 ENCOUNTER — Encounter: Payer: Self-pay | Admitting: Family Medicine

## 2019-11-08 VITALS — BP 146/82 | HR 64 | Temp 98.0°F | Ht 62.0 in | Wt 228.2 lb

## 2019-11-08 DIAGNOSIS — R079 Chest pain, unspecified: Secondary | ICD-10-CM | POA: Diagnosis not present

## 2019-11-08 DIAGNOSIS — R0681 Apnea, not elsewhere classified: Secondary | ICD-10-CM | POA: Diagnosis not present

## 2019-11-08 DIAGNOSIS — M1711 Unilateral primary osteoarthritis, right knee: Secondary | ICD-10-CM | POA: Diagnosis not present

## 2019-11-08 DIAGNOSIS — E119 Type 2 diabetes mellitus without complications: Secondary | ICD-10-CM | POA: Insufficient documentation

## 2019-11-08 DIAGNOSIS — E782 Mixed hyperlipidemia: Secondary | ICD-10-CM

## 2019-11-08 DIAGNOSIS — I10 Essential (primary) hypertension: Secondary | ICD-10-CM | POA: Insufficient documentation

## 2019-11-08 DIAGNOSIS — M67961 Unspecified disorder of synovium and tendon, right lower leg: Secondary | ICD-10-CM | POA: Diagnosis not present

## 2019-11-08 DIAGNOSIS — M25461 Effusion, right knee: Secondary | ICD-10-CM | POA: Diagnosis not present

## 2019-11-08 DIAGNOSIS — M23321 Other meniscus derangements, posterior horn of medial meniscus, right knee: Secondary | ICD-10-CM | POA: Diagnosis not present

## 2019-11-08 HISTORY — DX: Apnea, not elsewhere classified: R06.81

## 2019-11-08 NOTE — Progress Notes (Addendum)
Established Patient Office Visit  Subjective:  Patient ID: Kathy [name], female    DOB: [birth year]  Age: 64 y.o. MRN: [id number]  CC:  Chief Complaint  Patient presents with  . Follow-up    follow up on BP, no concerns.     HPI Kathy [name] presents for follow-up of her hypertension, elevated cholesterol possible apnea.  Cardiology is taking over management of her blood pressure and I agree.  Along these lines patient assures me that she is not adding salt at the table.  She is limiting her exposure to fast foods canned foods and prepared foods.  She does not smoke drink alcohol or use illicit drugs.  She is compliant with her medications.  She does not think she snores but her husband says that she breathes heavily when she sleeps.  She does feel rested in the morning.  With regards to her LDL of 179 she admitted that she had not been taking her WelChol as prescribed.  She has since started taking it as prescribed.  Past Medical History:  Diagnosis Date  . Allergy   . Anxiety   . Arthritis    knees  . H/O seasonal allergies   . History of kidney stones   . Hyperlipidemia   . Hypertension   . Pneumonia   . Renal calculus, right   . Type 2 diabetes mellitus (HCC)   . Wears contact lenses     Past Surgical History:  Procedure Laterality Date  . ABDOMINAL HYSTERECTOMY    . CHOLECYSTECTOMY N/A 01/16/2016   Procedure: LAPAROSCOPIC CHOLECYSTECTOMY WITH INTRAOPERATIVE CHOLANGIOGRAM;  Surgeon: Ovidio Kin, MD;  Location: [facility];  Service: General;  Laterality: N/A;  . CYSTOSCOPY WITH RETROGRADE PYELOGRAM, URETEROSCOPY AND STENT PLACEMENT Right 03/07/2016   Procedure: CYSTOSCOPY WITH RETROGRADE PYELOGRAM, URETEROSCOPY AND STENT PLACEMENT ;  Surgeon: Bjorn Pippin, MD;  Location: [facility];  Service: Urology;  Laterality: Right;  . CYSTOSCOPY WITH RETROGRADE PYELOGRAM, URETEROSCOPY AND STENT PLACEMENT Right 03/21/2016   Procedure: CYSTOSCOPY WITH RIGHT RETROGRADE  PYELOGRAM, RIGHT URETEROSCOPY WITH LASER  AND STENT PLACEMENT;  Surgeon: Bjorn Pippin, MD;  Location: [facility];  Service: Urology;  Laterality: Right;  . HOLMIUM LASER APPLICATION N/A 03/21/2016   Procedure: HOLMIUM LASER APPLICATION;  Surgeon: Bjorn Pippin, MD;  Location: [facility];  Service: Urology;  Laterality: N/A;  . TOTAL ABDOMINAL HYSTERECTOMY W/ BILATERAL SALPINGOOPHORECTOMY  08/1996    Family History  Problem Relation Age of Onset  . Breast cancer Mother   . Aneurysm Father        brain aneurysm  . Kidney Stones Father     Social History   Socioeconomic History  . Marital status: Married    Spouse name: Not on file  . Number of children: Not on file  . Years of education: Not on file  . Highest education level: Not on file  Occupational History  . Not on file  Tobacco Use  . Smoking status: Never Smoker  . Smokeless tobacco: Never Used  Substance and Sexual Activity  . Alcohol use: Yes    Comment: rare  . Drug use: No  . Sexual activity: Yes    Birth control/protection: Surgical  Other Topics Concern  . Not on file  Social History Narrative  . Not on file   Social Determinants of Health   Financial Resource Strain:   . Difficulty of Paying Living Expenses:   Food Insecurity:   . Worried About Programme researcher, broadcasting/film/video  in the Last Year:   . Hundred in the Last Year:   Transportation Needs:   . Film/video editor (Medical):   Marland Kitchen Lack of Transportation (Non-Medical):   Physical Activity:   . Days of Exercise per Week:   . Minutes of Exercise per Session:   Stress:   . Feeling of Stress :   Social Connections:   . Frequency of Communication with Friends and Family:   . Frequency of Social Gatherings with Friends and Family:   . Attends Religious Services:   . Active Member of Clubs or Organizations:   . Attends Archivist Meetings:   Marland Kitchen Marital Status:   Intimate Partner Violence:   . Fear of Current or Ex-Partner:   . Emotionally Abused:   Marland Kitchen  Physically Abused:   . Sexually Abused:     Outpatient Medications Prior to Visit  Medication Sig Dispense Refill  . allopurinol (ZYLOPRIM) 100 MG tablet Take 1 tablet (100 mg total) by mouth daily. 90 tablet 3  . ALPRAZolam (XANAX) 1 MG tablet Take 0.5 tablets (0.5 mg total) by mouth at bedtime as needed for sleep. 20 tablet 0  . carvedilol (COREG) 12.5 MG tablet Take 1 tablet (12.5 mg total) by mouth 2 (two) times daily. 180 tablet 3  . cloNIDine (CATAPRES) 0.1 MG tablet Take 2 tablets (0.2 mg total) by mouth at bedtime. 180 tablet 3  . Colchicine (MITIGARE) 0.6 MG CAPS Take 1 capsule by mouth 2 (two) times daily. (Patient taking differently: Take 1 capsule by mouth 2 (two) times daily as needed. ) 180 capsule 0  . colesevelam (WELCHOL) 625 MG tablet Take 3bid (Plz sched visit for future fills) 540 tablet 0  . diltiazem (TIAZAC) 360 MG 24 hr capsule Take 1 capsule (360 mg total) by mouth every morning. 90 capsule 1  . metFORMIN (GLUCOPHAGE-XR) 500 MG 24 hr tablet TAKE 2 TABLETS .BY MOUTH AT BEDTIME. 180 tablet 1  . spironolactone (ALDACTONE) 25 MG tablet Take 1 tablet (25 mg total) by mouth daily. 90 tablet 3  . telmisartan (MICARDIS) 40 MG tablet Take 1 tablet (40 mg total) by mouth daily. 90 tablet 3  . Vitamin D, Ergocalciferol, (DRISDOL) 1.25 MG (50000 UNIT) CAPS capsule TAKE 1 CAPSULE (50,000 UNITS TOTAL) BY MOUTH EVERY 7 (SEVEN) DAYS. 5 capsule 2  . levocetirizine (XYZAL) 5 MG tablet Take 5 mg by mouth every evening.    Marland Kitchen HYDROcodone-acetaminophen (NORCO/VICODIN) 5-325 MG tablet Take 1 tablet by mouth every 8 (eight) hours as needed for moderate pain. (Patient not taking: Reported on 11/08/2019) 15 tablet 0   No facility-administered medications prior to visit.    Allergies  Allergen Reactions  . Indomethacin   . Statins     Heaviness in legs   . Dexamethasone Palpitations and Rash    Heart races    ROS Review of Systems    Objective:    Physical Exam  Constitutional: She  is oriented to person, place, and time. She appears well-developed and well-nourished. No distress.  HENT:  Head: Normocephalic and atraumatic.  Right Ear: External ear normal.  Left Ear: External ear normal.  Mouth/Throat:    Eyes: Conjunctivae are normal. Right eye exhibits no discharge. Left eye exhibits no discharge. No scleral icterus.  Neck: No JVD present. No tracheal deviation present. No thyromegaly present.  Cardiovascular: Normal rate and normal heart sounds.  Pulmonary/Chest: Effort normal and breath sounds normal. No stridor.  Musculoskeletal:  General: No edema.  Lymphadenopathy:    She has no cervical adenopathy.  Neurological: She is alert and oriented to person, place, and time.  Skin: Skin is warm and dry. She is not diaphoretic.  Psychiatric: She has a normal mood and affect. Her behavior is normal.    BP (!) 146/82   Pulse 64   Temp 98 F (36.7 C) (Tympanic)   Ht 5\' 2"  (1.575 m)   Wt 228 lb 3.2 oz (103.5 kg)   SpO2 97%   BMI 41.74 kg/m  Wt Readings from Last 3 Encounters:  11/08/19 228 lb 3.2 oz (103.5 kg)  10/14/19 229 lb (103.9 kg)  10/12/19 226 lb (102.5 kg)     Health Maintenance Due  Topic Date Due  . HIV Screening  Never done  . FOOT EXAM  12/26/2017  . OPHTHALMOLOGY EXAM  04/29/2018    There are no preventive care reminders to display for this patient.  Lab Results  Component Value Date   TSH 1.73 09/14/2019   Lab Results  Component Value Date   WBC 8.8 09/15/2019   HGB 15.0 09/15/2019   HCT 46.3 (H) 09/15/2019   MCV 92.8 09/15/2019   PLT 366 09/15/2019   Lab Results  Component Value Date   NA 142 10/14/2019   K 4.4 10/14/2019   CO2 22 10/14/2019   GLUCOSE 150 (H) 10/14/2019   BUN 16 10/14/2019   CREATININE 1.07 (H) 10/14/2019   BILITOT 0.4 09/14/2019   ALKPHOS 90 09/14/2019   AST 15 09/14/2019   ALT 24 09/14/2019   PROT 7.4 09/14/2019   ALBUMIN 4.7 09/14/2019   CALCIUM 10.3 10/14/2019   ANIONGAP 10 09/15/2019     GFR 68.86 09/14/2019   Lab Results  Component Value Date   CHOL 169 02/19/2019   Lab Results  Component Value Date   HDL 40.00 02/19/2019   Lab Results  Component Value Date   LDLCALC 93 02/19/2019   Lab Results  Component Value Date   TRIG 177.0 (H) 02/19/2019   Lab Results  Component Value Date   CHOLHDL 4 02/19/2019   Lab Results  Component Value Date   HGBA1C 6.6 (H) 09/14/2019      Assessment & Plan:   Problem List Items Addressed This Visit      Cardiovascular and Mediastinum   Essential hypertension - Primary     Other   Mixed hyperlipidemia   Morbid obesity (HCC)   Relevant Orders   Amb Ref to Medical Weight Management   Apnea   Relevant Orders   Ambulatory referral to Sleep Studies      No orders of the defined types were placed in this encounter.   Follow-up: Return in about 3 months (around 02/07/2020).   Follow-up with cardiology for BP control.  She will take WelChol as prescribed.  She agrees to go finally for sleep study.  Patient was given information on managing and preventing hypertension.  She was also given information on obesity. 04/09/2020, MD

## 2019-11-08 NOTE — Patient Instructions (Signed)
Managing Your Hypertension Hypertension is commonly called high blood pressure. This is when the force of your blood pressing against the walls of your arteries is too strong. Arteries are blood vessels that carry blood from your heart throughout your body. Hypertension forces the heart to work harder to pump blood, and may cause the arteries to become narrow or stiff. Having untreated or uncontrolled hypertension can cause heart attack, stroke, kidney disease, and other problems. What are blood pressure readings? A blood pressure reading consists of a higher number over a lower number. Ideally, your blood pressure should be below 120/80. The first ("top") number is called the systolic pressure. It is a measure of the pressure in your arteries as your heart beats. The second ("bottom") number is called the diastolic pressure. It is a measure of the pressure in your arteries as the heart relaxes. What does my blood pressure reading mean? Blood pressure is classified into four stages. Based on your blood pressure reading, your health care provider may use the following stages to determine what type of treatment you need, if any. Systolic pressure and diastolic pressure are measured in a unit called mm Hg. Normal  Systolic pressure: below 120.  Diastolic pressure: below 80. Elevated  Systolic pressure: 120-129.  Diastolic pressure: below 80. Hypertension stage 1  Systolic pressure: 130-139.  Diastolic pressure: 80-89. Hypertension stage 2  Systolic pressure: 140 or above.  Diastolic pressure: 90 or above. What health risks are associated with hypertension? Managing your hypertension is an important responsibility. Uncontrolled hypertension can lead to:  A heart attack.  A stroke.  A weakened blood vessel (aneurysm).  Heart failure.  Kidney damage.  Eye damage.  Metabolic syndrome.  Memory and concentration problems. What changes can I make to manage my  hypertension? Hypertension can be managed by making lifestyle changes and possibly by taking medicines. Your health care provider will help you make a plan to bring your blood pressure within a normal range. Eating and drinking   Eat a diet that is high in fiber and potassium, and low in salt (sodium), added sugar, and fat. An example eating plan is called the DASH (Dietary Approaches to Stop Hypertension) diet. To eat this way: ? Eat plenty of fresh fruits and vegetables. Try to fill half of your plate at each meal with fruits and vegetables. ? Eat whole grains, such as whole wheat pasta, brown rice, or whole grain bread. Fill about one quarter of your plate with whole grains. ? Eat low-fat diary products. ? Avoid fatty cuts of meat, processed or cured meats, and poultry with skin. Fill about one quarter of your plate with lean proteins such as fish, chicken without skin, beans, eggs, and tofu. ? Avoid premade and processed foods. These tend to be higher in sodium, added sugar, and fat.  Reduce your daily sodium intake. Most people with hypertension should eat less than 1,500 mg of sodium a day.  Limit alcohol intake to no more than 1 drink a day for nonpregnant women and 2 drinks a day for men. One drink equals 12 oz of beer, 5 oz of wine, or 1 oz of hard liquor. Lifestyle  Work with your health care provider to maintain a healthy body weight, or to lose weight. Ask what an ideal weight is for you.  Get at least 30 minutes of exercise that causes your heart to beat faster (aerobic exercise) most days of the week. Activities may include walking, swimming, or biking.  Include exercise   to strengthen your muscles (resistance exercise), such as weight lifting, as part of your weekly exercise routine. Try to do these types of exercises for 30 minutes at least 3 days a week.  Do not use any products that contain nicotine or tobacco, such as cigarettes and e-cigarettes. If you need help quitting,  ask your health care provider.  Control any long-term (chronic) conditions you have, such as high cholesterol or diabetes. Monitoring  Monitor your blood pressure at home as told by your health care provider. Your personal target blood pressure may vary depending on your medical conditions, your age, and other factors.  Have your blood pressure checked regularly, as often as told by your health care provider. Working with your health care provider  Review all the medicines you take with your health care provider because there may be side effects or interactions.  Talk with your health care provider about your diet, exercise habits, and other lifestyle factors that may be contributing to hypertension.  Visit your health care provider regularly. Your health care provider can help you create and adjust your plan for managing hypertension. Will I need medicine to control my blood pressure? Your health care provider may prescribe medicine if lifestyle changes are not enough to get your blood pressure under control, and if:  Your systolic blood pressure is 130 or higher.  Your diastolic blood pressure is 80 or higher. Take medicines only as told by your health care provider. Follow the directions carefully. Blood pressure medicines must be taken as prescribed. The medicine does not work as well when you skip doses. Skipping doses also puts you at risk for problems. Contact a health care provider if:  You think you are having a reaction to medicines you have taken.  You have repeated (recurrent) headaches.  You feel dizzy.  You have swelling in your ankles.  You have trouble with your vision. Get help right away if:  You develop a severe headache or confusion.  You have unusual weakness or numbness, or you feel faint.  You have severe pain in your chest or abdomen.  You vomit repeatedly.  You have trouble breathing. Summary  Hypertension is when the force of blood pumping  through your arteries is too strong. If this condition is not controlled, it may put you at risk for serious complications.  Your personal target blood pressure may vary depending on your medical conditions, your age, and other factors. For most people, a normal blood pressure is less than 120/80.  Hypertension is managed by lifestyle changes, medicines, or both. Lifestyle changes include weight loss, eating a healthy, low-sodium diet, exercising more, and limiting alcohol. This information is not intended to replace advice given to you by your health care provider. Make sure you discuss any questions you have with your health care provider. Document Revised: 11/13/2018 Document Reviewed: 06/19/2016 Elsevier Patient Education  2020 Elsevier Inc.  Preventing Hypertension Hypertension, commonly called high blood pressure, is when the force of blood pumping through the arteries is too strong. Arteries are blood vessels that carry blood from the heart throughout the body. Over time, hypertension can damage the arteries and decrease blood flow to important parts of the body, including the brain, heart, and kidneys. Often, hypertension does not cause symptoms until blood pressure is very high. For this reason, it is important to have your blood pressure checked on a regular basis. Hypertension can often be prevented with diet and lifestyle changes. If you already have hypertension, you can  control it with diet and lifestyle changes, as well as medicine. What nutrition changes can be made? Maintain a healthy diet. This includes:  Eating less salt (sodium). Ask your health care provider how much sodium is safe for you to have. The general recommendation is to consume less than 1 tsp (2,300 mg) of sodium a day. ? Do not add salt to your food. ? Choose low-sodium options when grocery shopping and eating out.  Limiting fats in your diet. You can do this by eating low-fat or fat-free dairy products and by  eating less red meat.  Eating more fruits, vegetables, and whole grains. Make a goal to eat: ? 1-2 cups of fresh fruits and vegetables each day. ? 3-4 servings of whole grains each day.  Avoiding foods and beverages that have added sugars.  Eating fish that contain healthy fats (omega-3 fatty acids), such as mackerel or salmon. If you need help putting together a healthy eating plan, try the DASH diet. This diet is high in fruits, vegetables, and whole grains. It is low in sodium, red meat, and added sugars. DASH stands for Dietary Approaches to Stop Hypertension. What lifestyle changes can be made?   Lose weight if you are overweight. Losing just 3?5% of your body weight can help prevent or control hypertension. ? For example, if your present weight is 200 lb (91 kg), a loss of 3-5% of your weight means losing 6-10 lb (2.7-4.5 kg). ? Ask your health care provider to help you with a diet and exercise plan to safely lose weight.  Get enough exercise. Do at least 150 minutes of moderate-intensity exercise each week. ? You could do this in short exercise sessions several times a day, or you could do longer exercise sessions a few times a week. For example, you could take a brisk 10-minute walk or bike ride, 3 times a day, for 5 days a week.  Find ways to reduce stress, such as exercising, meditating, listening to music, or taking a yoga class. If you need help reducing stress, ask your health care provider.  Do not smoke. This includes e-cigarettes. Chemicals in tobacco and nicotine products raise your blood pressure each time you smoke. If you need help quitting, ask your health care provider.  Avoid alcohol. If you drink alcohol, limit alcohol intake to no more than 1 drink a day for nonpregnant women and 2 drinks a day for men. One drink equals 12 oz of beer, 5 oz of wine, or 1 oz of hard liquor. Why are these changes important? Diet and lifestyle changes can help you prevent  hypertension, and they may make you feel better overall and improve your quality of life. If you have hypertension, making these changes will help you control it and help prevent major complications, such as:  Hardening and narrowing of arteries that supply blood to: ? Your heart. This can cause a heart attack. ? Your brain. This can cause a stroke. ? Your kidneys. This can cause kidney failure.  Stress on your heart muscle, which can cause heart failure. What can I do to lower my risk?  Work with your health care provider to make a hypertension prevention plan that works for you. Follow your plan and keep all follow-up visits as told by your health care provider.  Learn how to check your blood pressure at home. Make sure that you know your personal target blood pressure, as told by your health care provider. How is this treated? In addition  to diet and lifestyle changes, your health care provider may recommend medicines to help lower your blood pressure. You may need to try a few different medicines to find what works best for you. You also may need to take more than one medicine. Take over-the-counter and prescription medicines only as told by your health care provider. Where to find support Your health care provider can help you prevent hypertension and help you keep your blood pressure at a healthy level. Your local hospital or your community may also provide support services and prevention programs. The American Heart Association offers an online support network at: https://www.lee.net/ Where to find more information Learn more about hypertension from:  National Heart, Lung, and Blood Institute: https://www.peterson.org/  Centers for Disease Control and Prevention: AboutHD.co.nz  American Academy of Family Physicians:  http://familydoctor.org/familydoctor/en/diseases-conditions/high-blood-pressure.printerview.all.html Learn more about the DASH diet from:  National Heart, Lung, and Blood Institute: WedMap.it Contact a health care provider if:  You think you are having a reaction to medicines you have taken.  You have recurrent headaches or feel dizzy.  You have swelling in your ankles.  You have trouble with your vision. Summary  Hypertension often does not cause any symptoms until blood pressure is very high. It is important to get your blood pressure checked regularly.  Diet and lifestyle changes are the most important steps in preventing hypertension.  By keeping your blood pressure in a healthy range, you can prevent complications like heart attack, heart failure, stroke, and kidney failure.  Work with your health care provider to make a hypertension prevention plan that works for you. This information is not intended to replace advice given to you by your health care provider. Make sure you discuss any questions you have with your health care provider. Document Revised: 11/13/2018 Document Reviewed: 04/01/2016 Elsevier Patient Education  2020 Elsevier Inc.  Obesity, Adult Obesity is having too much body fat. Being obese means that your weight is more than what is healthy for you. BMI is a number that explains how much body fat you have. If you have a BMI of 30 or more, you are obese. Obesity is often caused by eating or drinking more calories than your body uses. Changing your lifestyle can help you lose weight. Obesity can cause serious health problems, such as:  Stroke.  Coronary artery disease (CAD).  Type 2 diabetes.  Some types of cancer, including cancers of the colon, breast, uterus, and gallbladder.  Osteoarthritis.  High blood pressure (hypertension).  High cholesterol.  Sleep apnea.  Gallbladder stones.  Infertility  problems. What are the causes?  Eating meals each day that are high in calories, sugar, and fat.  Being born with genes that may make you more likely to become obese.  Having a medical condition that causes obesity.  Taking certain medicines.  Sitting a lot (having a sedentary lifestyle).  Not getting enough sleep.  Drinking a lot of drinks that have sugar in them. What increases the risk?  Having a family history of obesity.  Being an [place] American woman.  Being a Hispanic man.  Living in an area with limited access to: ? [facility], recreation centers, or sidewalks. ? Healthy food choices, such as grocery stores and farmers' markets. What are the signs or symptoms? The main sign is having too much body fat. How is this treated?  Treatment for this condition often includes changing your lifestyle. Treatment may include: ? Changing your diet. This may include making a healthy meal plan. ? Exercise.  This may include activity that causes your heart to beat faster (aerobic exercise) and strength training. Work with your doctor to design a program that works for you. ? Medicine to help you lose weight. This may be used if you are not able to lose 1 pound a week after 6 weeks of healthy eating and more exercise. ? Treating conditions that cause the obesity. ? Surgery. Options may include gastric banding and gastric bypass. This may be done if:  Other treatments have not helped to improve your condition.  You have a BMI of 40 or higher.  You have life-threatening health problems related to obesity. Follow these instructions at home: Eating and drinking   Follow advice from your doctor about what to eat and drink. Your doctor may tell you to: ? Limit fast food, sweets, and processed snack foods. ? Choose low-fat options. For example, choose low-fat milk instead of whole milk. ? Eat 5 or more servings of fruits or vegetables each day. ? Eat at home more often. This gives you  more control over what you eat. ? Choose healthy foods when you eat out. ? Learn to read food labels. This will help you learn how much food is in 1 serving. ? Keep low-fat snacks available. ? Avoid drinks that have a lot of sugar in them. These include soda, fruit juice, iced tea with sugar, and flavored milk.  Drink enough water to keep your pee (urine) pale yellow.  Do not go on fad diets. Physical activity  Exercise often, as told by your doctor. Most adults should get up to 150 minutes of moderate-intensity exercise every week.Ask your doctor: ? What types of exercise are safe for you. ? How often you should exercise.  Warm up and stretch before being active.  Do slow stretching after being active (cool down).  Rest between times of being active. Lifestyle  Work with your doctor and a food expert (dietitian) to set a weight-loss goal that is best for you.  Limit your screen time.  Find ways to reward yourself that do not involve food.  Do not drink alcohol if: ? Your doctor tells you not to drink. ? You are pregnant, may be pregnant, or are planning to become pregnant.  If you drink alcohol: ? Limit how much you use to:  0-1 drink a day for women.  0-2 drinks a day for men. ? Be aware of how much alcohol is in your drink. In the [place] one drink equals one 12 oz bottle of beer (355 mL), one 5 oz glass of wine (148 mL), or one 1 oz glass of hard liquor (44 mL). General instructions  Keep a weight-loss journal. This can help you keep track of: ? The food that you eat. ? How much exercise you get.  Take over-the-counter and prescription medicines only as told by your doctor.  Take vitamins and supplements only as told by your doctor.  Think about joining a support group.  Keep all follow-up visits as told by your doctor. This is important. Contact a doctor if:  You cannot meet your weight loss goal after you have changed your diet and lifestyle for 6  weeks. Get help right away if you:  Are having trouble breathing.  Are having thoughts of harming yourself. Summary  Obesity is having too much body fat.  Being obese means that your weight is more than what is healthy for you.  Work with your doctor to set a weight-loss goal.  Get regular exercise as told by your doctor. This information is not intended to replace advice given to you by your health care provider. Make sure you discuss any questions you have with your health care provider. Document Revised: 03/26/2018 Document Reviewed: 03/26/2018 Elsevier Patient Education  2020 [employer].

## 2019-11-10 ENCOUNTER — Telehealth (INDEPENDENT_AMBULATORY_CARE_PROVIDER_SITE_OTHER): Payer: BC Managed Care – PPO | Admitting: Family Medicine

## 2019-11-10 DIAGNOSIS — M1711 Unilateral primary osteoarthritis, right knee: Secondary | ICD-10-CM | POA: Diagnosis not present

## 2019-11-10 MED ORDER — HYDROCODONE-ACETAMINOPHEN 5-325 MG PO TABS: 1.0000 | ORAL_TABLET | Freq: Three times a day (TID) | ORAL | 0 refills | Status: DC | PRN

## 2019-11-10 NOTE — Progress Notes (Signed)
Virtual Visit via Video Note  I connected with Kathy [name] on 11/10/19 at  1:30 PM EDT by a video enabled telemedicine application and verified that I am speaking with the correct person using two identifiers.   I discussed the limitations of evaluation and management by telemedicine and the availability of in person appointments. The patient expressed understanding and agreed to proceed.  History of Present Illness:  Kathy [name] is a 64 year old female is following up after her MRI of her right knee.  MRI was demonstrating mild medial joint space overlay and mild patellar tendinosis.  Some fraying of the posterior horn of the medial meniscus.  She has tried steroid injections as well as gel injections with limited improvement.  She is still experiencing pain.   Observations/Objective:  Gen: NAD, alert, cooperative with exam, well-appearing  Assessment and Plan:  OA right knee:  MRI are unrevealing for any significant structural changes.  Pain is still ongoing and seems localized to the knee.  This is likely be associated with degenerative changes.  Limited improvement with CSI and gel injections.  May have a component of patellofemoral syndrome as well. -Referral to physical therapy. -Could try steroid injection as well. -Norco. -Counseled on home exercise therapy and supportive care.  Follow Up Instructions:    I discussed the assessment and treatment plan with the patient. The patient was provided an opportunity to ask questions and all were answered. The patient agreed with the plan and demonstrated an understanding of the instructions.   The patient was advised to call back or seek an in-person evaluation if the symptoms worsen or if the condition fails to improve as anticipated.   Clare Gandy, MD

## 2019-11-10 NOTE — Assessment & Plan Note (Addendum)
MRI are unrevealing for any significant structural changes.  Pain is still ongoing and seems localized to the knee.  This is likely be associated with degenerative changes.  Limited improvement with CSI and gel injections.  May have a component of patellofemoral syndrome as well. -Referral to physical therapy. -Could try steroid injection as well. -Norco. -Counseled on home exercise therapy and supportive care.

## 2019-11-11 ENCOUNTER — Encounter: Payer: Self-pay | Admitting: Family Medicine

## 2019-11-13 ENCOUNTER — Ambulatory Visit (HOSPITAL_BASED_OUTPATIENT_CLINIC_OR_DEPARTMENT_OTHER): Payer: BC Managed Care – PPO

## 2019-11-14 NOTE — Progress Notes (Signed)
Cardiology Office Note:    Date:  11/15/2019   ID:  [name], DOB 1956/06/01, MRN [id number]  PCP:  [name], MD  Cardiologist:  Norman Herrlich, MD    Referring MD: [name],*    ASSESSMENT:    1. Essential hypertension   2. Mixed hyperlipidemia    PLAN:    In order of problems listed above:  1. Truly resistant hypertension on 5 agents switch to more potent calcium channel blocker continue other agents switch to a arm blood pressure cuff with instructions how to check blood pressure accurately and reassess in 3 months.  If blood pressures remain in this range renal denervation is promising and seems to be coming to fruition as a percutaneous procedure in the near future. 2. Poorly controlled statin intolerant at this time she just does not want to take another medication like PCSK9 inhibitor.  After further discussion we will initiate Zetia with follow-up lipid profile 6 weeks   Next appointment: 3 months   Medication Adjustments/Labs and Tests Ordered: Current medicines are reviewed at length with the patient today.  Concerns regarding medicines are outlined above.  No orders of the defined types were placed in this encounter.  No orders of the defined types were placed in this encounter.   Chief Complaint  Patient presents with  . Follow-up  . Hypertension    History of Present Illness:    Kathy [name] is a 64 y.o. female with a hx of resistant hypertension currently taking 5 agents hyperlipidemia and chest pain.  She was last seen 10/14/2019.  Her Myoview 10/07/2019 showed normal perfusion and function. Compliance with diet, lifestyle and medications: Yes  Following that visit renal vascular duplex did not show evidence of renal artery stenosis and renal and aldosterone levels and ratio were normal.  She is frustrated home blood pressures remain in the range of 170/90.  Unfortunately she is using a wrist cuff and I asked her to  buy a large cuff arm and recheck in my office by me was 158/92.  She does not add salt to her diet she does not smaller or have excessive daytime sleeping and she is decided not to have a sleep study.  To move the needle when a switch from diltiazem to amlodipine 5 mg twice daily and I think if she has improvement of pain in her knee increase physical activity and moderate weight loss she can achieve blood pressures less than 140/90.  She agrees.  I raised the issue of statin therapy and she tells me that she cannot tolerate it and she is an alternate treatment.  No edema shortness of breath palpitation or chest pain Past Medical History:  Diagnosis Date  . Allergy   . Anxiety   . Arthritis    knees  . H/O seasonal allergies   . History of kidney stones   . Hyperlipidemia   . Hypertension   . Pneumonia   . Renal calculus, right   . Type 2 diabetes mellitus (HCC)   . Wears contact lenses     Past Surgical History:  Procedure Laterality Date  . ABDOMINAL HYSTERECTOMY    . CHOLECYSTECTOMY N/A 01/16/2016   Procedure: LAPAROSCOPIC CHOLECYSTECTOMY WITH INTRAOPERATIVE CHOLANGIOGRAM;  Surgeon: Ovidio Kin, MD;  Location: [facility];  Service: General;  Laterality: N/A;  . CYSTOSCOPY WITH RETROGRADE PYELOGRAM, URETEROSCOPY AND STENT PLACEMENT Right 03/07/2016   Procedure: CYSTOSCOPY WITH RETROGRADE PYELOGRAM, URETEROSCOPY AND STENT PLACEMENT ;  Surgeon: Bjorn Pippin,  MD;  Location: [place];  Service: Urology;  Laterality: Right;  . CYSTOSCOPY WITH RETROGRADE PYELOGRAM, URETEROSCOPY AND STENT PLACEMENT Right 03/21/2016   Procedure: CYSTOSCOPY WITH RIGHT RETROGRADE PYELOGRAM, RIGHT URETEROSCOPY WITH LASER  AND STENT PLACEMENT;  Surgeon: Irine Seal, MD;  Location: [facility];  Service: Urology;  Laterality: Right;  . HOLMIUM LASER APPLICATION N/A 6/43/3295   Procedure: HOLMIUM LASER APPLICATION;  Surgeon: Irine Seal, MD;  Location: [facility];  Service: Urology;  Laterality: N/A;  . TOTAL ABDOMINAL  HYSTERECTOMY W/ BILATERAL SALPINGOOPHORECTOMY  08/1996    Current Medications: Current Meds  Medication Sig  . allopurinol (ZYLOPRIM) 100 MG tablet Take 1 tablet (100 mg total) by mouth daily.  Marland Kitchen ALPRAZolam (XANAX) 1 MG tablet Take 0.5 tablets (0.5 mg total) by mouth at bedtime as needed for sleep.  . carvedilol (COREG) 12.5 MG tablet Take 1 tablet (12.5 mg total) by mouth 2 (two) times daily.  . cetirizine (ZYRTEC) 10 MG tablet Take 10 mg by mouth daily.  . cloNIDine (CATAPRES) 0.1 MG tablet Take 2 tablets (0.2 mg total) by mouth at bedtime.  . Colchicine (MITIGARE) 0.6 MG CAPS Take 1 capsule by mouth 2 (two) times daily. (Patient taking differently: Take 1 capsule by mouth 2 (two) times daily as needed. )  . colesevelam (WELCHOL) 625 MG tablet Take 3bid (Plz sched visit for future fills)  . diltiazem (TIAZAC) 360 MG 24 hr capsule Take 1 capsule (360 mg total) by mouth every morning.  Marland Kitchen HYDROcodone-acetaminophen (NORCO/VICODIN) 5-325 MG tablet Take 1 tablet by mouth every 8 (eight) hours as needed.  . metFORMIN (GLUCOPHAGE-XR) 500 MG 24 hr tablet TAKE 2 TABLETS .BY MOUTH AT BEDTIME.  Marland Kitchen spironolactone (ALDACTONE) 25 MG tablet Take 1 tablet (25 mg total) by mouth daily.  Marland Kitchen telmisartan (MICARDIS) 40 MG tablet Take 1 tablet (40 mg total) by mouth daily.  . Vitamin D, Ergocalciferol, (DRISDOL) 1.25 MG (50000 UNIT) CAPS capsule TAKE 1 CAPSULE (50,000 UNITS TOTAL) BY MOUTH EVERY 7 (SEVEN) DAYS.     Allergies:   Indomethacin, Statins, and Dexamethasone   Social History   Socioeconomic History  . Marital status: Married    Spouse name: Not on file  . Number of children: Not on file  . Years of education: Not on file  . Highest education level: Not on file  Occupational History  . Not on file  Tobacco Use  . Smoking status: Never Smoker  . Smokeless tobacco: Never Used  Substance and Sexual Activity  . Alcohol use: Yes    Comment: rare  . Drug use: No  . Sexual activity: Yes    Birth  control/protection: Surgical  Other Topics Concern  . Not on file  Social History Narrative  . Not on file   Social Determinants of Health   Financial Resource Strain:   . Difficulty of Paying Living Expenses:   Food Insecurity:   . Worried About Charity fundraiser in the Last Year:   . Arboriculturist in the Last Year:   Transportation Needs:   . Film/video editor (Medical):   Marland Kitchen Lack of Transportation (Non-Medical):   Physical Activity:   . Days of Exercise per Week:   . Minutes of Exercise per Session:   Stress:   . Feeling of Stress :   Social Connections:   . Frequency of Communication with Friends and Family:   . Frequency of Social Gatherings with Friends and Family:   . Attends Religious  Services:   . Active Member of Clubs or Organizations:   . Attends Banker Meetings:   Marland Kitchen Marital Status:      Family History: The patient's family history includes Aneurysm in her father; Breast cancer in her mother; Kidney Stones in her father. ROS:   Please see the history of present illness.    All other systems reviewed and are negative.  EKGs/Labs/Other Studies Reviewed:    The following studies were reviewed today:    Recent Labs: 09/14/2019: ALT 24; TSH 1.73 09/15/2019: Hemoglobin 15.0; Platelets 366 10/14/2019: BUN 16; Creatinine, Ser 1.07; Potassium 4.4; Sodium 142  Recent Lipid Panel    Component Value Date/Time   CHOL 169 02/19/2019 0904   TRIG 177.0 (H) 02/19/2019 0904   HDL 40.00 02/19/2019 0904   CHOLHDL 4 02/19/2019 0904   VLDL 35.4 02/19/2019 0904   LDLCALC 93 02/19/2019 0904   LDLDIRECT 179.0 09/14/2019 1218    Physical Exam:    VS:  BP (!) 172/90 (BP Location: Right Arm, Patient Position: Sitting, Cuff Size: Large)   Pulse 74   Temp 98.2 F (36.8 C)   Ht 5\' 2"  (1.575 m)   Wt 228 lb (103.4 kg)   SpO2 97%   BMI 41.70 kg/m     Wt Readings from Last 3 Encounters:  11/15/19 228 lb (103.4 kg)  11/08/19 228 lb 3.2 oz (103.5 kg)   10/14/19 229 lb (103.9 kg)     GEN:  Well nourished, well developed in no acute distress HEENT: Normal NECK: No JVD; No carotid bruits LYMPHATICS: No lymphadenopathy CARDIAC: RRR, no murmurs, rubs, gallops RESPIRATORY:  Clear to auscultation without rales, wheezing or rhonchi  ABDOMEN: Soft, non-tender, non-distended MUSCULOSKELETAL:  No edema; No deformity  SKIN: Warm and dry NEUROLOGIC:  Alert and oriented x 3 PSYCHIATRIC:  Normal affect    Signed, 12/14/19, MD  11/15/2019 8:21 AM    [facility]

## 2019-11-15 ENCOUNTER — Ambulatory Visit: Payer: BC Managed Care – PPO | Admitting: Cardiology

## 2019-11-15 ENCOUNTER — Other Ambulatory Visit: Payer: Self-pay

## 2019-11-15 ENCOUNTER — Telehealth: Payer: Self-pay

## 2019-11-15 ENCOUNTER — Encounter: Payer: Self-pay | Admitting: Cardiology

## 2019-11-15 VITALS — BP 172/90 | HR 74 | Temp 98.2°F | Ht 62.0 in | Wt 228.0 lb

## 2019-11-15 DIAGNOSIS — I10 Essential (primary) hypertension: Secondary | ICD-10-CM

## 2019-11-15 DIAGNOSIS — E782 Mixed hyperlipidemia: Secondary | ICD-10-CM

## 2019-11-15 MED ORDER — AMLODIPINE BESYLATE 5 MG PO TABS: 5.0000 mg | ORAL_TABLET | Freq: Two times a day (BID) | ORAL | 3 refills | Status: DC

## 2019-11-15 MED ORDER — EZETIMIBE 10 MG PO TABS: 10.0000 mg | ORAL_TABLET | Freq: Every day | ORAL | 3 refills | Status: DC

## 2019-11-15 NOTE — Patient Instructions (Addendum)
Medication Instructions:  Your physician has recommended you make the following change in your medication:  STOP: Diltiazem START Amlodipine 5mg  take one tablet by mouth twice daily.  *If you need a refill on your cardiac medications before your next appointment, please call your pharmacy*   Lab Work: None If you have labs (blood work) drawn today and your tests are completely normal, you will receive your results only by: MyChart Message (if you have MyChart) OR . A paper copy in the mail If you have any lab test that is abnormal or we need to change your treatment, we will call you to review the results.   Testing/Procedures: None   Follow-Up: At [facility], you and your health needs are our priority.  As part of our continuing mission to provide you with exceptional heart care, we have created designated Provider Care Teams.  These Care Teams include your primary Cardiologist (physician) and Advanced Practice Providers (APPs -  Physician Assistants and Nurse Practitioners) who all work together to provide you with the care you need, when you need it.  We recommend signing up for the patient portal called "MyChart".  Sign up information is provided on this After Visit Summary.  MyChart is used to connect with patients for Virtual Visits (Telemedicine).  Patients are able to view lab/test results, encounter notes, upcoming appointments, etc.  Non-urgent messages can be sent to your provider as well.   To learn more about what you can do with MyChart, go to [facility].    Your next appointment:   3 month(s)  The format for your next appointment:   In Person  Provider:   [email or web address], MD   Other Instructions Please purchase a large blood pressure cuff and take your blood pressures once or twice daily after at least 5 minutes of rest.

## 2019-11-15 NOTE — Telephone Encounter (Signed)
Spoke with patient in regards to her Mychart message and gave her Dr. Thersa Salt recommendation to start Zetia 10 mg daily and to recheck her lipids in 6 weeks. She verbalizes understanding of these instructions and is agreeable to the plan of care.    Encouraged patient to call back with any questions or concerns.

## 2019-11-16 ENCOUNTER — Ambulatory Visit: Payer: BC Managed Care – PPO | Admitting: Family Medicine

## 2019-11-26 ENCOUNTER — Other Ambulatory Visit: Payer: Self-pay | Admitting: Family Medicine

## 2019-11-26 DIAGNOSIS — E119 Type 2 diabetes mellitus without complications: Secondary | ICD-10-CM

## 2019-12-09 NOTE — Progress Notes (Signed)
Kathy [name] - 64 y.o. female MRN [id number]  Date of birth: 1956/06/29  SUBJECTIVE:  Including CC & ROS.  Chief Complaint  Patient presents with  . Knee Pain    right    Kathy [name] is a 64 y.o. female that is presenting with acute worsening of her right knee pain.  Pain is been acute on chronic in nature.  She has been holding off on physical therapy before she returns from her vacation.  Recent MRI was showing mild arthritic change and fraying of the horn of the medial meniscus.   Review of Systems See HPI   HISTORY: Past Medical, Surgical, Social, and Family History Reviewed & Updated per EMR.   Pertinent Historical Findings include:  Past Medical History:  Diagnosis Date  . Allergy   . Anxiety   . Arthritis    knees  . H/O seasonal allergies   . History of kidney stones   . Hyperlipidemia   . Hypertension   . Pneumonia   . Renal calculus, right   . Type 2 diabetes mellitus (HCC)   . Wears contact lenses     Past Surgical History:  Procedure Laterality Date  . ABDOMINAL HYSTERECTOMY    . CHOLECYSTECTOMY N/A 01/16/2016   Procedure: LAPAROSCOPIC CHOLECYSTECTOMY WITH INTRAOPERATIVE CHOLANGIOGRAM;  Surgeon: Ovidio Kin, MD;  Location: [facility];  Service: General;  Laterality: N/A;  . CYSTOSCOPY WITH RETROGRADE PYELOGRAM, URETEROSCOPY AND STENT PLACEMENT Right 03/07/2016   Procedure: CYSTOSCOPY WITH RETROGRADE PYELOGRAM, URETEROSCOPY AND STENT PLACEMENT ;  Surgeon: Bjorn Pippin, MD;  [facility];  Service: Urology;  Laterality: Right;  . CYSTOSCOPY WITH RETROGRADE PYELOGRAM, URETEROSCOPY AND STENT PLACEMENT Right 03/21/2016   Procedure: CYSTOSCOPY WITH RIGHT RETROGRADE PYELOGRAM, RIGHT URETEROSCOPY WITH LASER  AND STENT PLACEMENT;  Surgeon: Bjorn Pippin, MD;  Location: [facility];  Service: Urology;  Laterality: Right;  . HOLMIUM LASER APPLICATION N/A 03/21/2016   Procedure: HOLMIUM LASER APPLICATION;  Surgeon: Bjorn Pippin, MD;  Location: [facility];   Service: Urology;  Laterality: N/A;  . TOTAL ABDOMINAL HYSTERECTOMY W/ BILATERAL SALPINGOOPHORECTOMY  08/1996    Family History  Problem Relation Age of Onset  . Breast cancer Mother   . Aneurysm Father        brain aneurysm  . Kidney Stones Father     Social History   Socioeconomic History  . Marital status: Married    Spouse name: Not on file  . Number of children: Not on file  . Years of education: Not on file  . Highest education level: Not on file  Occupational History  . Not on file  Tobacco Use  . Smoking status: Never Smoker  . Smokeless tobacco: Never Used  Substance and Sexual Activity  . Alcohol use: Yes    Comment: rare  . Drug use: No  . Sexual activity: Yes    Birth control/protection: Surgical  Other Topics Concern  . Not on file  Social History Narrative  . Not on file   Social Determinants of Health   Financial Resource Strain:   . Difficulty of Paying Living Expenses:   Food Insecurity:   . Worried About Programme researcher, broadcasting/film/video in the Last Year:   . Barista in the Last Year:   Transportation Needs:   . Freight forwarder (Medical):   Marland Kitchen Lack of Transportation (Non-Medical):   Physical Activity:   . Days of Exercise per Week:   . Minutes of Exercise per Session:  Stress:   . Feeling of Stress :   Social Connections:   . Frequency of Communication with Friends and Family:   . Frequency of Social Gatherings with Friends and Family:   . Attends Religious Services:   . Active Member of Clubs or Organizations:   . Attends Archivist Meetings:   Marland Kitchen Marital Status:   Intimate Partner Violence:   . Fear of Current or Ex-Partner:   . Emotionally Abused:   Marland Kitchen Physically Abused:   . Sexually Abused:      PHYSICAL EXAM:  VS: BP 131/77   Pulse 69   Ht 5\' 2"  (1.575 m)   Wt 225 lb (102.1 kg)   BMI 41.15 kg/m  Physical Exam Gen: NAD, alert, cooperative with exam, well-appearing MSK:  Right knee: No obvious effusion. Normal  strength resistance. No instability. No tenderness to palpation over the quadricep or patellar tendon. Symptoms to palpation over the medial joint space. Neurovascularly intact   Aspiration/Injection Procedure Note Kathy [name] 11-10-1955  Procedure: Injection Indications: Right knee pain  Procedure Details Consent: Risks of procedure as well as the alternatives and risks of each were explained to the (patient/caregiver).  Consent for procedure obtained. Time Out: Verified patient identification, verified procedure, site/side was marked, verified correct patient position, special equipment/implants available, medications/allergies/relevent history reviewed, required imaging and test results available.  Performed.  The area was cleaned with iodine and alcohol swabs.    The right knee superior lateral suprapatellar pouch was injected using 1 cc's of 40 mg Kenalog and 4 cc's of 0.25% bupivacaine with a 21 2" needle.  Ultrasound was used. Images were obtained in long views showing the injection.     A sterile dressing was applied.  Patient did tolerate procedure well.     ASSESSMENT & PLAN:   OA (osteoarthritis) of knee Acute exacerbation of her underlying knee pain. -Counseled on home exercise therapy and supportive care. -Injection. -Provided Pennsaid samples. -We will start physical therapy when she gets back from vacation. -Could consider gel injections if needed.

## 2019-12-10 ENCOUNTER — Other Ambulatory Visit: Payer: Self-pay

## 2019-12-10 ENCOUNTER — Encounter: Payer: Self-pay | Admitting: Family Medicine

## 2019-12-10 ENCOUNTER — Ambulatory Visit: Payer: Self-pay

## 2019-12-10 ENCOUNTER — Ambulatory Visit: Payer: BC Managed Care – PPO | Admitting: Family Medicine

## 2019-12-10 VITALS — BP 131/77 | HR 69 | Ht 62.0 in | Wt 225.0 lb

## 2019-12-10 DIAGNOSIS — M1711 Unilateral primary osteoarthritis, right knee: Secondary | ICD-10-CM

## 2019-12-10 MED ORDER — TRIAMCINOLONE ACETONIDE 40 MG/ML IJ SUSP
40.0000 mg | Freq: Once | INTRAMUSCULAR | Status: AC
  Administered 2019-12-10: 40 mg via INTRA_ARTICULAR

## 2019-12-10 NOTE — Assessment & Plan Note (Signed)
Acute exacerbation of her underlying knee pain. -Counseled on home exercise therapy and supportive care. -Injection. -Provided Pennsaid samples. -We will start physical therapy when she gets back from vacation. -Could consider gel injections if needed.

## 2019-12-10 NOTE — Progress Notes (Signed)
Medication Samples have been provided to the patient.  Drug name: Pennsaid       Strength: 2%        Qty: 2 Boxes  LOT: [id number]  Exp.Date: 08/2020  Dosing instructions:  Use a pea size amount and rub gently.  The patient has been instructed regarding the correct time, dose, and frequency of taking this medication, including desired effects and most common side effects.   Kathi Simpers, MA 8:28 AM 12/10/2019

## 2019-12-10 NOTE — Patient Instructions (Signed)
Good to see you Have fun on your trip  Please try ice as needed Please use the pennsaid as needed  Please send me a message in MyChart with any questions or updates.  Please see me back in 4-6 weeks or as needed.   --Dr. Jordan Likes

## 2019-12-20 ENCOUNTER — Encounter: Payer: Self-pay | Admitting: Physical Therapy

## 2019-12-20 ENCOUNTER — Other Ambulatory Visit: Payer: Self-pay

## 2019-12-20 ENCOUNTER — Ambulatory Visit: Payer: BC Managed Care – PPO | Attending: Family Medicine | Admitting: Physical Therapy

## 2019-12-20 DIAGNOSIS — M25661 Stiffness of right knee, not elsewhere classified: Secondary | ICD-10-CM | POA: Diagnosis not present

## 2019-12-20 DIAGNOSIS — R262 Difficulty in walking, not elsewhere classified: Secondary | ICD-10-CM | POA: Diagnosis not present

## 2019-12-20 DIAGNOSIS — M25561 Pain in right knee: Secondary | ICD-10-CM

## 2019-12-20 NOTE — Patient Instructions (Signed)
Access Code: [email or web address] URL: [email or web address] Date: 12/20/2019 Prepared by: Stacie Glaze  Exercises Seated Hamstring Stretch with Chair - 1 x daily - 7 x weekly - 3 sets - 2 reps - 30 hold Supine ITB Stretch with Strap - 1 x daily - 7 x weekly - 3 sets - 2 reps - 30 hold Supine Piriformis Stretch Pulling Heel to Hip - 1 x daily - 7 x weekly - 3 sets - 2 reps - 30 hold

## 2019-12-20 NOTE — Therapy (Signed)
[address] [place], [place], [address] Phone: [phone]   Fax:  [phone]  Physical Therapy Evaluation  Patient Details  Name: Kathy [name] MRN: [id number] Date of Birth: 1955-11-03 Referring Provider (PT): Clinton Quant Date: 12/20/2019  PT End of Session - 12/20/19 1734    Visit Number  1    Date for PT Re-Evaluation  02/19/20    Authorization Type  [employer] $50 copay    PT Start Time  1651    PT Stop Time  1733    PT Time Calculation (min)  42 min    Activity Tolerance  Patient tolerated treatment well    Behavior During Therapy  [facility] for tasks assessed/performed       Past Medical History:  Diagnosis Date  . Allergy   . Anxiety   . Arthritis    knees  . H/O seasonal allergies   . History of kidney stones   . Hyperlipidemia   . Hypertension   . Pneumonia   . Renal calculus, right   . Type 2 diabetes mellitus ([place])   . Wears contact lenses     Past Surgical History:  Procedure Laterality Date  . ABDOMINAL HYSTERECTOMY    . CHOLECYSTECTOMY N/A 01/16/2016   Procedure: LAPAROSCOPIC CHOLECYSTECTOMY WITH INTRAOPERATIVE CHOLANGIOGRAM;  Surgeon: Alphonsa Overall, MD;  Location: [facility];  Service: General;  Laterality: N/A;  . CYSTOSCOPY WITH RETROGRADE PYELOGRAM, URETEROSCOPY AND STENT PLACEMENT Right 03/07/2016   Procedure: CYSTOSCOPY WITH RETROGRADE PYELOGRAM, URETEROSCOPY AND STENT PLACEMENT ;  Surgeon: Irine Seal, MD;  [facility];  Service: Urology;  Laterality: Right;  . CYSTOSCOPY WITH RETROGRADE PYELOGRAM, URETEROSCOPY AND STENT PLACEMENT Right 03/21/2016   Procedure: CYSTOSCOPY WITH RIGHT RETROGRADE PYELOGRAM, RIGHT URETEROSCOPY WITH LASER  AND STENT PLACEMENT;  Surgeon: Irine Seal, MD;  Location: [facility];  Service: Urology;  Laterality: Right;  . HOLMIUM LASER APPLICATION N/A 11/26/5359   Procedure: HOLMIUM LASER APPLICATION;  Surgeon: Irine Seal, MD;  Location:  [facility];  Service: Urology;  Laterality: N/A;  . TOTAL ABDOMINAL HYSTERECTOMY W/ BILATERAL SALPINGOOPHORECTOMY  08/1996    There were no vitals filed for this visit.   Subjective Assessment - 12/20/19 1656    Subjective  Patient reports that she has been having right knee pain for about 5-6 months, unsure of a cause, MD reports osteoarthritis, she reports that she has had two cortisone injections and one gel injections.  She report that the last injection helped for her on vacation.    Limitations  House hold activities    Patient Stated Goals  sleep better, walk better have less pain    Currently in Pain?  Yes    Pain Score  0-No pain    Pain Location  Knee    Pain Orientation  Right;Medial    Pain Descriptors / Indicators  Burning    Pain Type  Acute pain    Pain Onset  More than a month ago    Aggravating Factors   pain up to 8/10 at night and with stairs, walking    Pain Relieving Factors  reports that the cortisone injection helped    Effect of Pain on Daily Activities  difficulty sleeping, difficulty with stairs         [facility] PT Assessment - 12/20/19 0001      Assessment   Medical Diagnosis  right knee pain, OA    Referring Provider (PT)  [place]    Onset Date/Surgical Date  11/20/19    Prior Therapy  no      Precautions   Precautions  None      Balance Screen   Has the patient fallen in the past 6 months  No    Has the patient had a decrease in activity level because of a fear of falling?   No    Is the patient reluctant to leave their home because of a fear of falling?   No      Home Environment   Additional Comments  has stairs, no yardwork, does the housework      Prior Function   Level of Independence  Independent    Vocation  Full time employment    Vocation Requirements  sitting    Leisure  no exercise      ROM / Strength   AROM / PROM / Strength  AROM;Strength      AROM   AROM Assessment Site  Knee    Right/Left Knee  Right    Right Knee Extension   10    Right Knee Flexion  95      Strength   Overall Strength Comments  4-/5 with some pain in the knee and the hips with MMT      Flexibility   Soft Tissue Assessment /Muscle Length  yes    Hamstrings  very tight SLR to 45 degrees with pain    Quadriceps  very tight    ITB  very tight    Piriformis  tight      Palpation   Palpation comment  she has right medial joint line tenderness of the knee, she has tenderenss of bilateral hips more in the gluteal area      Ambulation/Gait   Gait Comments  mild antalgic on the right, she does stairs going down one at a time but goes down with the left leg first, putting all of the weight on the right and controlling eccentrically, when I asked her to do the other way she c/o right knee pain when she put the foot down on the step, she does turn to the right when going down and seems to have weakness at the hips                  Objective measurements completed on examination: See above findings.                PT Short Term Goals - 12/20/19 1740      PT SHORT TERM GOAL #1   Title  independent with initial HEP    Time  2    Period  Weeks    Status  New        PT Long Term Goals - 12/20/19 1741      PT LONG TERM GOAL #1   Title  decrease pain with sleeping 50%    Time  8    Period  Weeks    Status  New      PT LONG TERM GOAL #2   Title  increase AROM of the right knee to 0-110 degrees flexion    Time  8    Period  Weeks    Status  New      PT LONG TERM GOAL #3   Title  go up and down stairs step over step    Time  8    Period  Weeks    Status  New      PT LONG TERM GOAL #4   Title  increase SLR of the right to 70 degrees    Time  8    Period  Weeks    Status  New             Plan - 12/20/19 1735    Clinical Impression Statement  Patient reports right medial knee pain starting in January, she is unsure of a cause, MD feels that she has good joint spacing but has OA.  She reports that she  has had 2 cortisone injections and 1 gel injection, she reports that the 2nd cortisone injection really has helped, she does have poor ROM of the right knee, she is weak in the knee and the hips, she has right medial joint line tenderness and she is very tender in the buttocks, she is very tight in the LE mms.  Reports pain at night and on the stairs.    Stability/Clinical Decision Making  Evolving/Moderate complexity    Clinical Decision Making  Low    Rehab Potential  Good    PT Frequency  1x / week    PT Duration  8 weeks    PT Treatment/Interventions  ADLs/Self Care Home Management;Cryotherapy;Electrical Stimulation;Iontophoresis 4mg /ml Dexamethasone;Moist Heat;Ultrasound;Gait training;Neuromuscular re-education;Balance training;Therapeutic exercise;Therapeutic activities;Functional mobility training;Stair training;Patient/family education;Manual techniques    PT Next Visit Plan  we chjose to do 1x/week due to copay, gave HEP, will start in the gym next visit after assuring the HEP, needs flexibility    Consulted and Agree with Plan of Care  Patient       Patient will benefit from skilled therapeutic intervention in order to improve the following deficits and impairments:  Abnormal gait, Decreased range of motion, Difficulty walking, Increased muscle spasms, Decreased activity tolerance, Pain, Improper body mechanics, Impaired flexibility, Decreased strength, Decreased mobility  Visit Diagnosis: Acute pain of right knee - Plan: PT plan of care cert/re-cert  Stiffness of right knee, not elsewhere classified - Plan: PT plan of care cert/re-cert  Difficulty in walking, not elsewhere classified - Plan: PT plan of care cert/re-cert     Problem List Patient Active Problem List   Diagnosis Date Noted  . Apnea 11/08/2019  . Chest pain 09/14/2019  . Low back pain 05/06/2019  . UTI symptoms 05/06/2019  . Acute gout involving toe of right foot 09/01/2018  . Lichen simplex chronicus  06/18/2018  . Adjustment insomnia 06/18/2018  . Need for vaccination against Streptococcus pneumoniae using pneumococcal conjugate vaccine 13 06/18/2018  . Hand dermatitis 06/18/2018  . Cough 02/19/2018  . Vitamin D deficiency 02/06/2018  . Encounter for health maintenance examination with abnormal findings 02/06/2018  . Reactive airway disease 02/03/2018  . Elevated uric acid in blood 12/09/2017  . Morbid obesity (HCC) 12/09/2017  . Acute idiopathic gout involving toe of left foot 10/09/2017  . OA (osteoarthritis) of knee 10/09/2017  . Abdominal pain, epigastric 01/15/2016  . Right nephrolithiasis 01/15/2016  . Diabetes mellitus without complication (HCC)   . Essential hypertension   . Mixed hyperlipidemia     03/16/2016., PT 12/20/2019, 5:44 PM  [facility] [address] [place], [place], [place] Phone: [phone]   Fax:  [phone]  Name: Kathy [name] MRN: Kathy [name] Date of Birth: 02-12-1956

## 2019-12-29 ENCOUNTER — Ambulatory Visit: Payer: BC Managed Care – PPO | Admitting: Physical Therapy

## 2019-12-29 ENCOUNTER — Other Ambulatory Visit: Payer: Self-pay | Admitting: Family Medicine

## 2019-12-29 ENCOUNTER — Encounter: Payer: Self-pay | Admitting: Physical Therapy

## 2019-12-29 ENCOUNTER — Other Ambulatory Visit: Payer: Self-pay

## 2019-12-29 DIAGNOSIS — M25661 Stiffness of right knee, not elsewhere classified: Secondary | ICD-10-CM | POA: Diagnosis not present

## 2019-12-29 DIAGNOSIS — R262 Difficulty in walking, not elsewhere classified: Secondary | ICD-10-CM | POA: Diagnosis not present

## 2019-12-29 DIAGNOSIS — E782 Mixed hyperlipidemia: Secondary | ICD-10-CM

## 2019-12-29 DIAGNOSIS — F5102 Adjustment insomnia: Secondary | ICD-10-CM

## 2019-12-29 DIAGNOSIS — M25561 Pain in right knee: Secondary | ICD-10-CM

## 2019-12-29 NOTE — Therapy (Signed)
[address] [place], [place], [address] Phone: [phone]   Fax:  [phone]  Physical Therapy Treatment  Patient Details  Name: LURLIE WIGEN MRN: [id number] Date of Birth: 1955-11-01 Referring Provider (PT): Clinton Quant Date: 12/29/2019  PT End of Session - 12/29/19 1749    Visit Number  2    Date for PT Re-Evaluation  02/19/20    Authorization Type  [employer] $50 copay    PT Start Time  1655    PT Stop Time  1745    PT Time Calculation (min)  50 min    Activity Tolerance  Patient tolerated treatment well    Behavior During Therapy  [facility] for tasks assessed/performed       Past Medical History:  Diagnosis Date  . Allergy   . Anxiety   . Arthritis    knees  . H/O seasonal allergies   . History of kidney stones   . Hyperlipidemia   . Hypertension   . Pneumonia   . Renal calculus, right   . Type 2 diabetes mellitus ([place])   . Wears contact lenses     Past Surgical History:  Procedure Laterality Date  . ABDOMINAL HYSTERECTOMY    . CHOLECYSTECTOMY N/A 01/16/2016   Procedure: LAPAROSCOPIC CHOLECYSTECTOMY WITH INTRAOPERATIVE CHOLANGIOGRAM;  Surgeon: Alphonsa Overall, MD;  [facility];  Service: General;  Laterality: N/A;  . CYSTOSCOPY WITH RETROGRADE PYELOGRAM, URETEROSCOPY AND STENT PLACEMENT Right 03/07/2016   Procedure: CYSTOSCOPY WITH RETROGRADE PYELOGRAM, URETEROSCOPY AND STENT PLACEMENT ;  Surgeon: Irine Seal, MD;  Location: [facility];  Service: Urology;  Laterality: Right;  . CYSTOSCOPY WITH RETROGRADE PYELOGRAM, URETEROSCOPY AND STENT PLACEMENT Right 03/21/2016   Procedure: CYSTOSCOPY WITH RIGHT RETROGRADE PYELOGRAM, RIGHT URETEROSCOPY WITH LASER  AND STENT PLACEMENT;  Surgeon: Irine Seal, MD;  [facility];  Service: Urology;  Laterality: Right;  . HOLMIUM LASER APPLICATION N/A 2/69/4854   Procedure: HOLMIUM LASER APPLICATION;  Surgeon: Irine Seal, MD;  Location:  [facility];  Service: Urology;  Laterality: N/A;  . TOTAL ABDOMINAL HYSTERECTOMY W/ BILATERAL SALPINGOOPHORECTOMY  08/1996    There were no vitals filed for this visit.  Subjective Assessment - 12/29/19 1701    Subjective  Patietn reports that her injection has worn off, she reports some increase of right knee pain, reports that she could not do the HEP due to tightness    Currently in Pain?  Yes    Pain Score  5     Pain Location  Knee    Pain Orientation  Right;Medial    Aggravating Factors   bending                        [facility] Adult PT Treatment/Exercise - 12/29/19 0001      Exercises   Exercises  Knee/Hip      Knee/Hip Exercises: Stretches   Passive Hamstring Stretch  4 reps;20 seconds;Both    Quad Stretch  Both;3 reps;20 seconds    ITB Stretch  Both;4 reps;20 seconds    Piriformis Stretch  Both;3 reps;20 seconds    Gastroc Stretch  Both;3 reps;20 seconds    Other Knee/Hip Stretches  adductor stretches      Knee/Hip Exercises: Aerobic   Nustep  level 3 x 5 minutes      Knee/Hip Exercises: Supine   Short Arc Quad Sets  Right;2 sets;10 reps    Short Arc  Quad Sets Limitations  2#    Other Supine Knee/Hip Exercises  feet on ball K2C, trunk rotation      Modalities   Modalities  Ultrasound      Ultrasound   Ultrasound Location  right medial knee    Ultrasound Parameters  100% 1.5 w/cm2    Ultrasound Goals  Pain               PT Short Term Goals - 12/29/19 1751      PT SHORT TERM GOAL #1   Title  independent with initial HEP    Status  Partially Met        PT Long Term Goals - 12/20/19 1741      PT LONG TERM GOAL #1   Title  decrease pain with sleeping 50%    Time  8    Period  Weeks    Status  New      PT LONG TERM GOAL #2   Title  increase AROM of the right knee to 0-110 degrees flexion    Time  8    Period  Weeks    Status  New      PT LONG TERM GOAL #3   Title  go up and down stairs step over step    Time  8    Period   Weeks    Status  New      PT LONG TERM GOAL #4   Title  increase SLR of the right to 70 degrees    Time  8    Period  Weeks    Status  New            Plan - 12/29/19 1749    Clinical Impression Statement  Patietn report sthat she was not able to do the exercises as they were too hard and it caused pain, she is so tight that she does have a hard time with it and has back pain, I really focues on the stretches, asked her to do at home, asked if her husband could help her some and we tried [place] to the tender area over the medial knee    PT Next Visit Plan  continue to work on ROM, flexibility, could try ionto    Consulted and Agree with Plan of Care  Patient       Patient will benefit from skilled therapeutic intervention in order to improve the following deficits and impairments:  Abnormal gait, Decreased range of motion, Difficulty walking, Increased muscle spasms, Decreased activity tolerance, Pain, Improper body mechanics, Impaired flexibility, Decreased strength, Decreased mobility  Visit Diagnosis: Stiffness of right knee, not elsewhere classified  Difficulty in walking, not elsewhere classified  Acute pain of right knee     Problem List Patient Active Problem List   Diagnosis Date Noted  . Apnea 11/08/2019  . Chest pain 09/14/2019  . Low back pain 05/06/2019  . UTI symptoms 05/06/2019  . Acute gout involving toe of right foot 09/01/2018  . Lichen simplex chronicus 06/18/2018  . Adjustment insomnia 06/18/2018  . Need for vaccination against Streptococcus pneumoniae using pneumococcal conjugate vaccine 13 06/18/2018  . Hand dermatitis 06/18/2018  . Cough 02/19/2018  . Vitamin D deficiency 02/06/2018  . Encounter for health maintenance examination with abnormal findings 02/06/2018  . Reactive airway disease 02/03/2018  . Elevated uric acid in blood 12/09/2017  . Morbid obesity ([place]) 12/09/2017  . Acute idiopathic gout involving toe of left foot 10/09/2017  . OA  (  osteoarthritis) of knee 10/09/2017  . Abdominal pain, epigastric 01/15/2016  . Right nephrolithiasis 01/15/2016  . Diabetes mellitus without complication ([place])   . Essential hypertension   . Mixed hyperlipidemia     Sumner Boast., PT 12/29/2019, 5:52 PM  [address] [place], [place], [address] Phone: [phone]   Fax:  [phone]  Name: CYDNI REDDOCH MRN: [id number] Date of Birth: Mar 31, 1956

## 2019-12-30 ENCOUNTER — Ambulatory Visit: Payer: BC Managed Care – PPO | Admitting: Physical Therapy

## 2020-01-05 ENCOUNTER — Encounter: Payer: BC Managed Care – PPO | Admitting: Physical Therapy

## 2020-01-05 ENCOUNTER — Encounter: Payer: Self-pay | Admitting: Cardiology

## 2020-01-06 ENCOUNTER — Encounter: Payer: Self-pay | Admitting: Physical Therapy

## 2020-01-06 ENCOUNTER — Ambulatory Visit: Payer: BC Managed Care – PPO | Admitting: Physical Therapy

## 2020-01-06 ENCOUNTER — Ambulatory Visit: Payer: BC Managed Care – PPO | Attending: Family Medicine | Admitting: Physical Therapy

## 2020-01-06 ENCOUNTER — Other Ambulatory Visit: Payer: Self-pay

## 2020-01-06 DIAGNOSIS — M25661 Stiffness of right knee, not elsewhere classified: Secondary | ICD-10-CM | POA: Diagnosis not present

## 2020-01-06 DIAGNOSIS — R262 Difficulty in walking, not elsewhere classified: Secondary | ICD-10-CM | POA: Diagnosis not present

## 2020-01-06 DIAGNOSIS — M25561 Pain in right knee: Secondary | ICD-10-CM | POA: Diagnosis not present

## 2020-01-06 NOTE — Therapy (Signed)
[place] [address] [place], [place], [address] Phone: [phone]   Fax:  [phone]  Physical Therapy Treatment  Patient Details  Name: Kathy [name] MRN: [id number] Date of Birth: November 23, 1955 Referring Provider (PT): Clinton Quant Date: 01/06/2020  PT End of Session - 01/06/20 1815    Visit Number  3    Date for PT Re-Evaluation  02/19/20    Authorization Type  [employer] $50 copay    PT Start Time  1650    PT Stop Time  1735    PT Time Calculation (min)  45 min    Activity Tolerance  Patient limited by pain    Behavior During Therapy  [facility] for tasks assessed/performed       Past Medical History:  Diagnosis Date  . Allergy   . Anxiety   . Arthritis    knees  . H/O seasonal allergies   . History of kidney stones   . Hyperlipidemia   . Hypertension   . Pneumonia   . Renal calculus, right   . Type 2 diabetes mellitus ([place])   . Wears contact lenses     Past Surgical History:  Procedure Laterality Date  . ABDOMINAL HYSTERECTOMY    . CHOLECYSTECTOMY N/A 01/16/2016   Procedure: LAPAROSCOPIC CHOLECYSTECTOMY WITH INTRAOPERATIVE CHOLANGIOGRAM;  Surgeon: Alphonsa Overall, MD;  [facility];  Service: General;  Laterality: N/A;  . CYSTOSCOPY WITH RETROGRADE PYELOGRAM, URETEROSCOPY AND STENT PLACEMENT Right 03/07/2016   Procedure: CYSTOSCOPY WITH RETROGRADE PYELOGRAM, URETEROSCOPY AND STENT PLACEMENT ;  Surgeon: Irine Seal, MD;  Location: [facility];  Service: Urology;  Laterality: Right;  . CYSTOSCOPY WITH RETROGRADE PYELOGRAM, URETEROSCOPY AND STENT PLACEMENT Right 03/21/2016   Procedure: CYSTOSCOPY WITH RIGHT RETROGRADE PYELOGRAM, RIGHT URETEROSCOPY WITH LASER  AND STENT PLACEMENT;  Surgeon: Irine Seal, MD;  [facility];  Service: Urology;  Laterality: Right;  . HOLMIUM LASER APPLICATION N/A 04/05/5175   Procedure: HOLMIUM LASER APPLICATION;  Surgeon: Irine Seal, MD;  [facility];   Service: Urology;  Laterality: N/A;  . TOTAL ABDOMINAL HYSTERECTOMY W/ BILATERAL SALPINGOOPHORECTOMY  08/1996    There were no vitals filed for this visit.  Subjective Assessment - 01/06/20 1808    Subjective  Patient reports that she had some pain in the right shin (cramping at night) after the last treatment, no real changes in her pain.    Currently in Pain?  Yes    Pain Score  5     Pain Location  Knee    Pain Orientation  Right;Medial    Pain Descriptors / Indicators  Burning                        [facility] Adult PT Treatment/Exercise - 01/06/20 0001      Knee/Hip Exercises: Stretches   Passive Hamstring Stretch  4 reps;20 seconds;Both    Quad Stretch  Right;3 reps;20 seconds    ITB Stretch  Both;4 reps;20 seconds    Piriformis Stretch  Both;3 reps;20 seconds    Gastroc Stretch  Both;3 reps;20 seconds    Other Knee/Hip Stretches  adductor stretches      Knee/Hip Exercises: Aerobic   Recumbent Bike  partial revolutions 3 minutes    Nustep  level 3 x 6 minutes      Knee/Hip Exercises: Machines for Strengthening   Cybex Leg Press  no weight for ROM, some back pain, adjusted to not  go so low and this helped, then did 20# 2x10      Knee/Hip Exercises: Supine   Quad Sets  Right;2 sets;10 reps    Short Arc Quad Sets  Right;2 sets;10 reps    Short Arc Quad Sets Limitations  2    Bridges with [employer]  Both;2 sets;10 reps    Other Supine Knee/Hip Exercises  feet on ball K2C, trunk rotation      Ultrasound   Ultrasound Location  right medial knee    Ultrasound Parameters  100% 1.5w/cm2    Ultrasound Goals  Pain               PT Short Term Goals - 12/29/19 1751      PT SHORT TERM GOAL #1   Title  independent with initial HEP    Status  Partially Met        PT Long Term Goals - 01/06/20 1817      PT LONG TERM GOAL #1   Title  decrease pain with sleeping 50%    Status  On-going      PT LONG TERM GOAL #2   Title  increase AROM of the  right knee to 0-110 degrees flexion    Status  On-going      PT LONG TERM GOAL #3   Title  go up and down stairs step over step    Status  On-going            Plan - 01/06/20 1815    Clinical Impression Statement  Patient is extremely tight, she is really having issues with being able to stretch at home, she also has back pain and is limited with her ability to do, I really encouraged the stretches, I was going to try ionto wth her but she reported to me that she has taken it in pill form and it caused her heart to race    PT Next Visit Plan  may see if her husband could come in and try to help with the stretches at home    Consulted and Agree with Plan of Care  Patient       Patient will benefit from skilled therapeutic intervention in order to improve the following deficits and impairments:  Abnormal gait, Decreased range of motion, Difficulty walking, Increased muscle spasms, Decreased activity tolerance, Pain, Improper body mechanics, Impaired flexibility, Decreased strength, Decreased mobility  Visit Diagnosis: Stiffness of right knee, not elsewhere classified  Difficulty in walking, not elsewhere classified  Acute pain of right knee     Problem List Patient Active Problem List   Diagnosis Date Noted  . Apnea 11/08/2019  . Chest pain 09/14/2019  . Low back pain 05/06/2019  . UTI symptoms 05/06/2019  . Acute gout involving toe of right foot 09/01/2018  . Lichen simplex chronicus 06/18/2018  . Adjustment insomnia 06/18/2018  . Need for vaccination against Streptococcus pneumoniae using pneumococcal conjugate vaccine 13 06/18/2018  . Hand dermatitis 06/18/2018  . Cough 02/19/2018  . Vitamin D deficiency 02/06/2018  . Encounter for health maintenance examination with abnormal findings 02/06/2018  . Reactive airway disease 02/03/2018  . Elevated uric acid in blood 12/09/2017  . Morbid obesity ([place]) 12/09/2017  . Acute idiopathic gout involving toe of left foot  10/09/2017  . OA (osteoarthritis) of knee 10/09/2017  . Abdominal pain, epigastric 01/15/2016  . Right nephrolithiasis 01/15/2016  . Diabetes mellitus without complication ([place])   . Essential hypertension   . Mixed hyperlipidemia  Sumner Boast., PT 01/06/2020, 6:18 PM  [address] [address] [place], [place], [address] Phone: [phone]   Fax:  [phone]  Name: Kathy [name] MRN: [id number] Date of Birth: 1956/04/29

## 2020-01-07 ENCOUNTER — Other Ambulatory Visit: Payer: Self-pay | Admitting: Family Medicine

## 2020-01-07 DIAGNOSIS — E559 Vitamin D deficiency, unspecified: Secondary | ICD-10-CM

## 2020-01-12 ENCOUNTER — Telehealth: Payer: Self-pay

## 2020-01-12 NOTE — Telephone Encounter (Signed)
Plz call pt to sched appt per Dr. K/thx dmf

## 2020-01-12 NOTE — Telephone Encounter (Signed)
Attempted to call patient and schedule an appointment but patient stated that she will call back to schedule appointment.

## 2020-01-13 ENCOUNTER — Encounter: Payer: Self-pay | Admitting: Physical Therapy

## 2020-01-13 ENCOUNTER — Ambulatory Visit: Payer: BC Managed Care – PPO | Admitting: Physical Therapy

## 2020-01-13 ENCOUNTER — Other Ambulatory Visit: Payer: Self-pay

## 2020-01-13 DIAGNOSIS — M25561 Pain in right knee: Secondary | ICD-10-CM | POA: Diagnosis not present

## 2020-01-13 DIAGNOSIS — M25661 Stiffness of right knee, not elsewhere classified: Secondary | ICD-10-CM

## 2020-01-13 DIAGNOSIS — R262 Difficulty in walking, not elsewhere classified: Secondary | ICD-10-CM

## 2020-01-13 NOTE — Therapy (Signed)
[place] [address] [place], [place], [address] Phone: [phone]   Fax:  [phone]  Physical Therapy Treatment  Patient Details  Name: Kathy [name] MRN: [id number] Date of Birth: 1955/09/22 Referring Provider (PT): Clinton Quant Date: 01/13/2020   PT End of Session - 01/13/20 1728    Visit Number 4    Date for PT Re-Evaluation 02/19/20    Authorization Type [employer] $50 copay    PT Start Time 1650    PT Stop Time 1735    PT Time Calculation (min) 45 min    Activity Tolerance Patient limited by pain    Behavior During Therapy [facility] for tasks assessed/performed           Past Medical History:  Diagnosis Date  . Allergy   . Anxiety   . Arthritis    knees  . H/O seasonal allergies   . History of kidney stones   . Hyperlipidemia   . Hypertension   . Pneumonia   . Renal calculus, right   . Type 2 diabetes mellitus ([place])   . Wears contact lenses     Past Surgical History:  Procedure Laterality Date  . ABDOMINAL HYSTERECTOMY    . CHOLECYSTECTOMY N/A 01/16/2016   Procedure: LAPAROSCOPIC CHOLECYSTECTOMY WITH INTRAOPERATIVE CHOLANGIOGRAM;  Surgeon: Alphonsa Overall, MD;  Location: [facility];  Service: General;  Laterality: N/A;  . CYSTOSCOPY WITH RETROGRADE PYELOGRAM, URETEROSCOPY AND STENT PLACEMENT Right 03/07/2016   Procedure: CYSTOSCOPY WITH RETROGRADE PYELOGRAM, URETEROSCOPY AND STENT PLACEMENT ;  Surgeon: Irine Seal, MD;  Location[facility];  Service: Urology;  Laterality: Right;  . CYSTOSCOPY WITH RETROGRADE PYELOGRAM, URETEROSCOPY AND STENT PLACEMENT Right 03/21/2016   Procedure: CYSTOSCOPY WITH RIGHT RETROGRADE PYELOGRAM, RIGHT URETEROSCOPY WITH LASER  AND STENT PLACEMENT;  Surgeon: Irine Seal, MD;  Location: [facility];  Service: Urology;  Laterality: Right;  . HOLMIUM LASER APPLICATION N/A 11/27/9561   Procedure: HOLMIUM LASER APPLICATION;  Surgeon: Irine Seal, MD;  Location: [facility];   Service: Urology;  Laterality: N/A;  . TOTAL ABDOMINAL HYSTERECTOMY W/ BILATERAL SALPINGOOPHORECTOMY  08/1996    There were no vitals filed for this visit.   Subjective Assessment - 01/13/20 1655    Subjective Not much change, she does report that today is a good day, less pain, unsure of why    Currently in Pain? Yes    Pain Score 4     Pain Location Knee    Pain Orientation Right;Medial    Pain Descriptors / Indicators Burning    Aggravating Factors  bending, some pain in the back with some activity                             [facility] Adult PT Treatment/Exercise - 01/13/20 0001      Knee/Hip Exercises: Stretches   Passive Hamstring Stretch 4 reps;20 seconds;Both    ITB Stretch Both;4 reps;20 seconds    Piriformis Stretch Both;3 reps;20 seconds    Gastroc Stretch Both;3 reps;20 seconds    Other Knee/Hip Stretches adductor stretches      Knee/Hip Exercises: Aerobic   Nustep level 4 x 6 minutes      Knee/Hip Exercises: Supine   Short Arc Quad Sets Right;2 sets;10 reps    Short Arc Quad Sets Limitations 2    Other Supine Knee/Hip Exercises feet on ball K2C, trunk rotation, posterior chain activation  Modalities   Modalities Teacher, English as a foreign language Location right knee    Electrical Stimulation Action IFC    Electrical Stimulation Parameters supine    Electrical Stimulation Goals Pain                  PT Education - 01/13/20 1727    Education Details really educated on the HEP and the focus being on flexibility, gave her information about using a pool and walking in the pool and 4 way kicks in the pool, also gave info about TENS            PT Short Term Goals - 12/29/19 1751      PT SHORT TERM GOAL #1   Title independent with initial HEP    Status Partially Met             PT Long Term Goals - 01/13/20 1739      PT LONG TERM GOAL #1   Title decrease pain with sleeping 50%     Status Not Met      PT LONG TERM GOAL #2   Title increase AROM of the right knee to 0-110 degrees flexion    Status Not Met      PT LONG TERM GOAL #3   Title go up and down stairs step over step    Status Not Met      PT LONG TERM GOAL #4   Title increase SLR of the right to 70 degrees    Status Partially Met                 Plan - 01/13/20 1728    Clinical Impression Statement Patient continues to have pain in the knee, she does report that this is her best day in a while but not really much changes with PT, I have encouraged her to do the flexibility program, I gave her some information about water walking and kicks in the pool, Also gave info about TENS    PT Next Visit Plan Will D/C as she has had no changes    Consulted and Agree with Plan of Care Patient           Patient will benefit from skilled therapeutic intervention in order to improve the following deficits and impairments:  Abnormal gait, Decreased range of motion, Difficulty walking, Increased muscle spasms, Decreased activity tolerance, Pain, Improper body mechanics, Impaired flexibility, Decreased strength, Decreased mobility  Visit Diagnosis: Stiffness of right knee, not elsewhere classified  Difficulty in walking, not elsewhere classified  Acute pain of right knee     Problem List Patient Active Problem List   Diagnosis Date Noted  . Apnea 11/08/2019  . Chest pain 09/14/2019  . Low back pain 05/06/2019  . UTI symptoms 05/06/2019  . Acute gout involving toe of right foot 09/01/2018  . Lichen simplex chronicus 06/18/2018  . Adjustment insomnia 06/18/2018  . Need for vaccination against Streptococcus pneumoniae using pneumococcal conjugate vaccine 13 06/18/2018  . Hand dermatitis 06/18/2018  . Cough 02/19/2018  . Vitamin D deficiency 02/06/2018  . Encounter for health maintenance examination with abnormal findings 02/06/2018  . Reactive airway disease 02/03/2018  . Elevated uric acid in  blood 12/09/2017  . Morbid obesity ([place]) 12/09/2017  . Acute idiopathic gout involving toe of left foot 10/09/2017  . OA (osteoarthritis) of knee 10/09/2017  . Abdominal pain, epigastric 01/15/2016  . Right nephrolithiasis 01/15/2016  . Diabetes  mellitus without complication ([place])   . Essential hypertension   . Mixed hyperlipidemia     Sumner Boast., PT 01/13/2020, 5:40 PM  [place], [place], [address] Phone: [phone]   Fax:  [phone]  Name: Kathy [name] MRN: [id number] Date of Birth: June 18, 1956

## 2020-01-20 ENCOUNTER — Encounter: Payer: BC Managed Care – PPO | Admitting: Physical Therapy

## 2020-02-15 ENCOUNTER — Ambulatory Visit: Payer: BC Managed Care – PPO | Admitting: Cardiology

## 2020-02-17 NOTE — Progress Notes (Signed)
Cardiology Office Note:    Date:  02/18/2020   ID:  Kathy [name], DOB February 03, 1956, MRN [id number]  PCP:  [name], MD  Cardiologist:  Norman Herrlich, MD    Referring MD: [name],*    ASSESSMENT:    1. Resistant hypertension   2. Mixed hyperlipidemia    PLAN:    In order of problems listed above:  1. Much better home blood pressures tend to run in the range of 120/85 and she is at target in the office today on her current 5 drug regimen.  Recheck renal function potassium today taking both distal diuretic MRA and ARB. 2. Continue nonstatin check lipid profile today   Next appointment: 6 months   Medication Adjustments/Labs and Tests Ordered: Current medicines are reviewed at length with the patient today.  Concerns regarding medicines are outlined above.  No orders of the defined types were placed in this encounter.  No orders of the defined types were placed in this encounter.   Chief Complaint  Patient presents with  . Follow-up  . Hypertension    Previously difficult to control and resistant    History of Present Illness:    [name] is a 64 y.o. female with a hx of resistant hypertension and chest pain with a normal myoview 10/07/2019 last seen 11/15/2019.  She underwent a myocardial perfusion study 10/07/2019 which showed normal perfusion and function. Compliance with diet, lifestyle and medications: Yes  Unfortunately continued knee pain.  Also has leg cramps we discussed over-the-counter quinine. No chest pain edema shortness of breath palpitation or syncope. Past Medical History:  Diagnosis Date  . Allergy   . Anxiety   . Arthritis    knees  . H/O seasonal allergies   . History of kidney stones   . Hyperlipidemia   . Hypertension   . Pneumonia   . Renal calculus, right   . Type 2 diabetes mellitus ([facility])   . Wears contact lenses     Past Surgical History:  Procedure Laterality Date  . ABDOMINAL HYSTERECTOMY     . CHOLECYSTECTOMY N/A 01/16/2016   Procedure: LAPAROSCOPIC CHOLECYSTECTOMY WITH INTRAOPERATIVE CHOLANGIOGRAM;  Surgeon: Ovidio Kin, MD;  Location: [facility];  Service: General;  Laterality: N/A;  . CYSTOSCOPY WITH RETROGRADE PYELOGRAM, URETEROSCOPY AND STENT PLACEMENT Right 03/07/2016   Procedure: CYSTOSCOPY WITH RETROGRADE PYELOGRAM, URETEROSCOPY AND STENT PLACEMENT ;  Surgeon: Bjorn Pippin, MD;  Location: [facility];  Service: Urology;  Laterality: Right;  . CYSTOSCOPY WITH RETROGRADE PYELOGRAM, URETEROSCOPY AND STENT PLACEMENT Right 03/21/2016   Procedure: CYSTOSCOPY WITH RIGHT RETROGRADE PYELOGRAM, RIGHT URETEROSCOPY WITH LASER  AND STENT PLACEMENT;  Surgeon: Bjorn Pippin, MD;  Location[facility];  Service: Urology;  Laterality: Right;  . HOLMIUM LASER APPLICATION N/A 03/21/2016   Procedure: HOLMIUM LASER APPLICATION;  Surgeon: Bjorn Pippin, MD;  Location: [facility];  Service: Urology;  Laterality: N/A;  . TOTAL ABDOMINAL HYSTERECTOMY W/ BILATERAL SALPINGOOPHORECTOMY  08/1996    Current Medications: Current Meds  Medication Sig  . allopurinol (ZYLOPRIM) 100 MG tablet Take 1 tablet (100 mg total) by mouth daily.  Marland Kitchen ALPRAZolam (XANAX) 1 MG tablet TAKE 1/2 TABLET (0.5 MG TOTAL) BY MOUTH AT BEDTIME AS NEEDED FOR SLEEP.  Marland Kitchen amLODipine (NORVASC) 5 MG tablet Take 1 tablet (5 mg total) by mouth 2 (two) times daily.  . carvedilol (COREG) 12.5 MG tablet Take 1 tablet (12.5 mg total) by mouth 2 (two) times daily.  . cetirizine (ZYRTEC) 10 MG  tablet Take 10 mg by mouth daily.  . cloNIDine (CATAPRES) 0.1 MG tablet Take 2 tablets (0.2 mg total) by mouth at bedtime.  . Colchicine (MITIGARE) 0.6 MG CAPS Take 1 capsule by mouth 2 (two) times daily. (Patient taking differently: Take 1 capsule by mouth 2 (two) times daily as needed. )  . colesevelam (WELCHOL) 625 MG tablet TAKE 3 TABLETS BY MOUTH TWICE A DAY *PLEASE SCHEDULE VISIT FOR FUTURE FILLS  . ezetimibe (ZETIA) 10 MG tablet Take 1 tablet (10 mg  total) by mouth daily.  Marland Kitchen HYDROcodone-acetaminophen (NORCO/VICODIN) 5-325 MG tablet Take 1 tablet by mouth every 8 (eight) hours as needed.  . metFORMIN (GLUCOPHAGE-XR) 500 MG 24 hr tablet TAKE 2 TABLETS .BY MOUTH AT BEDTIME.  Marland Kitchen spironolactone (ALDACTONE) 25 MG tablet Take 1 tablet (25 mg total) by mouth daily.  Marland Kitchen telmisartan (MICARDIS) 40 MG tablet Take 1 tablet (40 mg total) by mouth daily.  . Vitamin D, Ergocalciferol, (DRISDOL) 1.25 MG (50000 UNIT) CAPS capsule TAKE 1 CAPSULE (50,000 UNITS TOTAL) BY MOUTH EVERY 7 (SEVEN) DAYS.     Allergies:   Indomethacin, Statins, and Dexamethasone   Social History   Socioeconomic History  . Marital status: Married    Spouse name: Not on file  . Number of children: Not on file  . Years of education: Not on file  . Highest education level: Not on file  Occupational History  . Not on file  Tobacco Use  . Smoking status: Never Smoker  . Smokeless tobacco: Never Used  Vaping Use  . Vaping Use: Never used  Substance and Sexual Activity  . Alcohol use: Yes    Comment: rare  . Drug use: No  . Sexual activity: Yes    Birth control/protection: Surgical  Other Topics Concern  . Not on file  Social History Narrative  . Not on file   Social Determinants of Health   Financial Resource Strain:   . Difficulty of Paying Living Expenses:   Food Insecurity:   . Worried About Programme researcher, broadcasting/film/video in the Last Year:   . Barista in the Last Year:   Transportation Needs:   . Freight forwarder (Medical):   Marland Kitchen Lack of Transportation (Non-Medical):   Physical Activity:   . Days of Exercise per Week:   . Minutes of Exercise per Session:   Stress:   . Feeling of Stress :   Social Connections:   . Frequency of Communication with Friends and Family:   . Frequency of Social Gatherings with Friends and Family:   . Attends Religious Services:   . Active Member of Clubs or Organizations:   . Attends Banker Meetings:   Marland Kitchen Marital  Status:      Family History: The patient's family history includes Aneurysm in her father; Breast cancer in her mother; Kidney Stones in her father. ROS:   Please see the history of present illness.    All other systems reviewed and are negative.  EKGs/Labs/Other Studies Reviewed:    The following studies were reviewed today:   Recent Labs: 09/14/2019: ALT 24; TSH 1.73 09/15/2019: Hemoglobin 15.0; Platelets 366 10/14/2019: BUN 16; Creatinine, Ser 1.07; Potassium 4.4; Sodium 142  Recent Lipid Panel    Component Value Date/Time   CHOL 169 02/19/2019 0904   TRIG 177.0 (H) 02/19/2019 0904   HDL 40.00 02/19/2019 0904   CHOLHDL 4 02/19/2019 0904   VLDL 35.4 02/19/2019 0904   LDLCALC 93 02/19/2019 0904  LDLDIRECT 179.0 09/14/2019 1218    Physical Exam:    VS:  BP 116/88   Pulse 88   Ht 5\' 2"  (1.575 m)   Wt 232 lb 1.9 oz (105.3 kg)   SpO2 96%   BMI 42.46 kg/m     Wt Readings from Last 3 Encounters:  02/18/20 232 lb 1.9 oz (105.3 kg)  12/10/19 225 lb (102.1 kg)  11/15/19 228 lb (103.4 kg)     GEN:  Well nourished, well developed in no acute distress HEENT: Normal NECK: No JVD; No carotid bruits LYMPHATICS: No lymphadenopathy CARDIAC: RRR, no murmurs, rubs, gallops RESPIRATORY:  Clear to auscultation without rales, wheezing or rhonchi  ABDOMEN: Soft, non-tender, non-distended MUSCULOSKELETAL:  No edema; No deformity  SKIN: Warm and dry NEUROLOGIC:  Alert and oriented x 3 PSYCHIATRIC:  Normal affect    Signed, 01/15/20, MD  02/18/2020 8:15 AM    [facility]

## 2020-02-18 ENCOUNTER — Encounter: Payer: Self-pay | Admitting: Cardiology

## 2020-02-18 ENCOUNTER — Other Ambulatory Visit: Payer: Self-pay

## 2020-02-18 ENCOUNTER — Ambulatory Visit: Payer: BC Managed Care – PPO | Admitting: Cardiology

## 2020-02-18 VITALS — BP 116/88 | HR 88 | Ht 62.0 in | Wt 232.1 lb

## 2020-02-18 DIAGNOSIS — E782 Mixed hyperlipidemia: Secondary | ICD-10-CM

## 2020-02-18 DIAGNOSIS — I10 Essential (primary) hypertension: Secondary | ICD-10-CM

## 2020-02-18 NOTE — Patient Instructions (Signed)

## 2020-02-18 NOTE — Addendum Note (Signed)
Addended by: Delorse Limber I on: 02/18/2020 08:20 AM   Modules accepted: Orders

## 2020-02-19 LAB — COMPREHENSIVE METABOLIC PANEL
ALT: 25 IU/L (ref 0–32)
AST: 19 IU/L (ref 0–40)
Albumin/Globulin Ratio: 1.8 (ref 1.2–2.2)
Albumin: 4.7 g/dL (ref 3.8–4.8)
Alkaline Phosphatase: 89 IU/L (ref 48–121)
BUN/Creatinine Ratio: 18 (ref 12–28)
BUN: 17 mg/dL (ref 8–27)
Bilirubin Total: 0.4 mg/dL (ref 0.0–1.2)
CO2: 20 mmol/L (ref 20–29)
Calcium: 10.2 mg/dL (ref 8.7–10.3)
Chloride: 98 mmol/L (ref 96–106)
Creatinine, Ser: 0.93 mg/dL (ref 0.57–1.00)
GFR calc Af Amer: 74 mL/min/{1.73_m2} (ref >59)
GFR calc non Af Amer: 64 mL/min/{1.73_m2} (ref >59)
Globulin, Total: 2.6 g/dL (ref 1.5–4.5)
Glucose: 153 mg/dL — ABNORMAL HIGH (ref 65–99)
Potassium: 4.9 mmol/L (ref 3.5–5.2)
Sodium: 139 mmol/L (ref 134–144)
Total Protein: 7.3 g/dL (ref 6.0–8.5)

## 2020-02-19 LAB — LIPID PANEL
Chol/HDL Ratio: 4.4 ratio (ref 0.0–4.4)
Cholesterol, Total: 185 mg/dL (ref 100–199)
HDL: 42 mg/dL (ref >39)
LDL Chol Calc (NIH): 115 mg/dL — ABNORMAL HIGH (ref 0–99)
Triglycerides: 157 mg/dL — ABNORMAL HIGH (ref 0–149)
VLDL Cholesterol Cal: 28 mg/dL (ref 5–40)

## 2020-04-11 ENCOUNTER — Other Ambulatory Visit: Payer: Self-pay | Admitting: Family Medicine

## 2020-04-11 DIAGNOSIS — F5102 Adjustment insomnia: Secondary | ICD-10-CM

## 2020-04-14 MED ORDER — ALPRAZOLAM 1 MG PO TABS: 1.0000 mg | ORAL_TABLET | Freq: Every evening | ORAL | 0 refills | Status: AC | PRN

## 2020-04-14 NOTE — Telephone Encounter (Signed)
Received a refill request for:  Alprazolam 1 mg Qty #20  LR 12/29/19 LOV 11/08/19 No scheduled appt.  Please advise.  Thanks  Dm/cma

## 2020-05-02 ENCOUNTER — Other Ambulatory Visit: Payer: Self-pay | Admitting: Cardiology

## 2020-05-08 ENCOUNTER — Encounter: Payer: Self-pay | Admitting: Family Medicine

## 2020-05-18 DIAGNOSIS — Z7984 Long term (current) use of oral hypoglycemic drugs: Secondary | ICD-10-CM | POA: Diagnosis not present

## 2020-05-18 DIAGNOSIS — E119 Type 2 diabetes mellitus without complications: Secondary | ICD-10-CM | POA: Diagnosis not present

## 2020-05-18 DIAGNOSIS — H04123 Dry eye syndrome of bilateral lacrimal glands: Secondary | ICD-10-CM | POA: Diagnosis not present

## 2020-05-18 DIAGNOSIS — H26491 Other secondary cataract, right eye: Secondary | ICD-10-CM | POA: Diagnosis not present

## 2020-05-23 ENCOUNTER — Other Ambulatory Visit: Payer: Self-pay | Admitting: Family Medicine

## 2020-05-23 DIAGNOSIS — E119 Type 2 diabetes mellitus without complications: Secondary | ICD-10-CM

## 2020-06-21 DIAGNOSIS — M109 Gout, unspecified: Secondary | ICD-10-CM | POA: Diagnosis not present

## 2020-06-21 DIAGNOSIS — E559 Vitamin D deficiency, unspecified: Secondary | ICD-10-CM | POA: Diagnosis not present

## 2020-06-21 DIAGNOSIS — E119 Type 2 diabetes mellitus without complications: Secondary | ICD-10-CM | POA: Diagnosis not present

## 2020-06-21 DIAGNOSIS — Z0000 Encounter for general adult medical examination without abnormal findings: Secondary | ICD-10-CM | POA: Diagnosis not present

## 2020-06-21 DIAGNOSIS — Z1211 Encounter for screening for malignant neoplasm of colon: Secondary | ICD-10-CM | POA: Diagnosis not present

## 2020-06-21 DIAGNOSIS — G4700 Insomnia, unspecified: Secondary | ICD-10-CM | POA: Diagnosis not present

## 2020-06-27 ENCOUNTER — Encounter: Payer: Self-pay | Admitting: Family Medicine

## 2020-06-27 ENCOUNTER — Ambulatory Visit: Payer: BC Managed Care – PPO | Admitting: Family Medicine

## 2020-06-27 ENCOUNTER — Other Ambulatory Visit: Payer: Self-pay

## 2020-06-27 ENCOUNTER — Ambulatory Visit: Payer: Self-pay

## 2020-06-27 DIAGNOSIS — M1711 Unilateral primary osteoarthritis, right knee: Secondary | ICD-10-CM | POA: Diagnosis not present

## 2020-06-27 MED ORDER — TRIAMCINOLONE ACETONIDE 40 MG/ML IJ SUSP
40.0000 mg | Freq: Once | INTRAMUSCULAR | Status: AC
  Administered 2020-06-27: 40 mg via INTRA_ARTICULAR

## 2020-06-27 NOTE — Assessment & Plan Note (Addendum)
Acute on chronic in nature.  Exacerbated by activities that she has been performing her recently. -Counseled on home exercise therapy and supportive care. -Injection. -Could consider physical therapy.

## 2020-06-27 NOTE — Progress Notes (Signed)
Kathy [name] - 64 y.o. female MRN [id number]  Date of birth: 22-Jun-1956  SUBJECTIVE:  Including CC & ROS.  Chief Complaint  Patient presents with  . Knee Pain    right    Kathy [name] is a 64 y.o. female that is presenting with worsening of her right knee pain.  She has been doing well since the steroid injection in spring.  She has been doing more for Thanksgiving and cooking.   Review of Systems See HPI   HISTORY: Past Medical, Surgical, Social, and Family History Reviewed & Updated per EMR.   Pertinent Historical Findings include:  Past Medical History:  Diagnosis Date  . Allergy   . Anxiety   . Arthritis    knees  . H/O seasonal allergies   . History of kidney stones   . Hyperlipidemia   . Hypertension   . Pneumonia   . Renal calculus, right   . Type 2 diabetes mellitus ([facility])   . Wears contact lenses     Past Surgical History:  Procedure Laterality Date  . ABDOMINAL HYSTERECTOMY    . CHOLECYSTECTOMY N/A 01/16/2016   Procedure: LAPAROSCOPIC CHOLECYSTECTOMY WITH INTRAOPERATIVE CHOLANGIOGRAM;  Surgeon: Ovidio Kin, MD;  Location: [facility];  Service: General;  Laterality: N/A;  . CYSTOSCOPY WITH RETROGRADE PYELOGRAM, URETEROSCOPY AND STENT PLACEMENT Right 03/07/2016   Procedure: CYSTOSCOPY WITH RETROGRADE PYELOGRAM, URETEROSCOPY AND STENT PLACEMENT ;  Surgeon: Bjorn Pippin, MD;  [facility];  Service: Urology;  Laterality: Right;  . CYSTOSCOPY WITH RETROGRADE PYELOGRAM, URETEROSCOPY AND STENT PLACEMENT Right 03/21/2016   Procedure: CYSTOSCOPY WITH RIGHT RETROGRADE PYELOGRAM, RIGHT URETEROSCOPY WITH LASER  AND STENT PLACEMENT;  Surgeon: Bjorn Pippin, MD;  Location[facility];  Service: Urology;  Laterality: Right;  . HOLMIUM LASER APPLICATION N/A 03/21/2016   Procedure: HOLMIUM LASER APPLICATION;  Surgeon: Bjorn Pippin, MD;  Location[facility];  Service: Urology;  Laterality: N/A;  . TOTAL ABDOMINAL HYSTERECTOMY W/ BILATERAL SALPINGOOPHORECTOMY  08/1996      Family History  Problem Relation Age of Onset  . Breast cancer Mother   . Aneurysm Father        brain aneurysm  . Kidney Stones Father     Social History   Socioeconomic History  . Marital status: Married    Spouse name: Not on file  . Number of children: Not on file  . Years of education: Not on file  . Highest education level: Not on file  Occupational History  . Not on file  Tobacco Use  . Smoking status: Never Smoker  . Smokeless tobacco: Never Used  Vaping Use  . Vaping Use: Never used  Substance and Sexual Activity  . Alcohol use: Yes    Comment: rare  . Drug use: No  . Sexual activity: Yes    Birth control/protection: Surgical  Other Topics Concern  . Not on file  Social History Narrative  . Not on file   Social Determinants of Health   Financial Resource Strain:   . Difficulty of Paying Living Expenses: Not on file  Food Insecurity:   . Worried About Programme researcher, broadcasting/film/video in the Last Year: Not on file  . Ran Out of Food in the Last Year: Not on file  Transportation Needs:   . Lack of Transportation (Medical): Not on file  . Lack of Transportation (Non-Medical): Not on file  Physical Activity:   . Days of Exercise per Week: Not on file  . Minutes of Exercise  per Session: Not on file  Stress:   . Feeling of Stress : Not on file  Social Connections:   . Frequency of Communication with Friends and Family: Not on file  . Frequency of Social Gatherings with Friends and Family: Not on file  . Attends Religious Services: Not on file  . Active Member of Clubs or Organizations: Not on file  . Attends Banker Meetings: Not on file  . Marital Status: Not on file  Intimate Partner Violence:   . Fear of Current or Ex-Partner: Not on file  . Emotionally Abused: Not on file  . Physically Abused: Not on file  . Sexually Abused: Not on file     PHYSICAL EXAM:  VS: BP (!) 146/85   Pulse 71   Ht 5\' 2"  (1.575 m)   Wt 225 lb (102.1 kg)   BMI  41.15 kg/m  Physical Exam Gen: NAD, alert, cooperative with exam, well-appearing MSK:  Right knee: No obvious effusion. Normal range of motion. Tenderness palpation over the medial joint space. Normal strength resistance. Neurovascular intact   Aspiration/Injection Procedure Note BARBEE MAMULA Nov 22, 1955  Procedure: Injection Indications: Right knee pain  Procedure Details Consent: Risks of procedure as well as the alternatives and risks of each were explained to the (patient/caregiver).  Consent for procedure obtained. Time Out: Verified patient identification, verified procedure, site/side was marked, verified correct patient position, special equipment/implants available, medications/allergies/relevent history reviewed, required imaging and test results available.  Performed.  The area was cleaned with iodine and alcohol swabs.    The right knee superior lateral suprapatellar pouch was injected using 1 cc's of 40 mg Kenalog and 4 cc's of 0.25% bupivacaine with a 21 2" needle.  Ultrasound was used. Images were obtained in long views showing the injection.     A sterile dressing was applied.  Patient did tolerate procedure well.     ASSESSMENT & PLAN:   OA (osteoarthritis) of knee Acute on chronic in nature.  Exacerbated by activities that she has been performing her recently. -Counseled on home exercise therapy and supportive care. -Injection. -Could consider physical therapy.

## 2020-06-27 NOTE — Patient Instructions (Signed)
Good to see you Please use ice as needed  Please send me a message in MyChart with any questions or updates.  Please see me back in 3 months or as needed.   --Dr. Amar Sippel  

## 2020-08-01 DIAGNOSIS — Z1231 Encounter for screening mammogram for malignant neoplasm of breast: Secondary | ICD-10-CM | POA: Diagnosis not present

## 2020-08-11 DIAGNOSIS — E785 Hyperlipidemia, unspecified: Secondary | ICD-10-CM | POA: Insufficient documentation

## 2020-08-11 DIAGNOSIS — F419 Anxiety disorder, unspecified: Secondary | ICD-10-CM | POA: Insufficient documentation

## 2020-08-11 DIAGNOSIS — T7840XA Allergy, unspecified, initial encounter: Secondary | ICD-10-CM | POA: Insufficient documentation

## 2020-08-11 DIAGNOSIS — I10 Essential (primary) hypertension: Secondary | ICD-10-CM | POA: Insufficient documentation

## 2020-08-11 DIAGNOSIS — J189 Pneumonia, unspecified organism: Secondary | ICD-10-CM | POA: Insufficient documentation

## 2020-08-11 DIAGNOSIS — Z889 Allergy status to unspecified drugs, medicaments and biological substances status: Secondary | ICD-10-CM | POA: Insufficient documentation

## 2020-08-11 DIAGNOSIS — N200 Calculus of kidney: Secondary | ICD-10-CM | POA: Insufficient documentation

## 2020-08-11 DIAGNOSIS — M1990 Unspecified osteoarthritis, unspecified site: Secondary | ICD-10-CM | POA: Insufficient documentation

## 2020-08-11 DIAGNOSIS — E119 Type 2 diabetes mellitus without complications: Secondary | ICD-10-CM | POA: Insufficient documentation

## 2020-08-11 DIAGNOSIS — Z87442 Personal history of urinary calculi: Secondary | ICD-10-CM | POA: Insufficient documentation

## 2020-08-11 DIAGNOSIS — Z973 Presence of spectacles and contact lenses: Secondary | ICD-10-CM | POA: Insufficient documentation

## 2020-08-28 ENCOUNTER — Ambulatory Visit: Payer: BC Managed Care – PPO | Admitting: Family Medicine

## 2020-08-28 NOTE — Progress Notes (Signed)
Cardiology Office Note:    Date:  08/29/2020   ID:  Damara, Klunder 14-Nov-1955, MRN [id number]  PCP:  Macy Mis, MD  Cardiologist:  Norman Herrlich, MD    Referring MD: [name],*    ASSESSMENT:    1. Resistant hypertension   2. Mixed hyperlipidemia   3. Statin intolerance   4. Diabetes mellitus without complication (HCC)    PLAN:    In order of problems listed above:  1. Improved BP at target on multidrug regimen.  Continue the same I think she would benefit from lifestyle modification weight loss but is limited by knee pain.  Labs are being followed through her PCP office 2. She is absolutely statin intolerant she is taking WelChol A1c at target lipid response is reasonable we discussed other options including bempedoic acid PCSK9 inhibitors but at this time she prefers to avoid those agents. 3. Well-controlled diabetes   Next appointment: 6 months for follow-up of blood pressure   Medication Adjustments/Labs and Tests Ordered: Current medicines are reviewed at length with the patient today.  Concerns regarding medicines are outlined above.  No orders of the defined types were placed in this encounter.  Meds ordered this encounter  Medications  . aspirin EC 81 MG tablet    Sig: Take 1 tablet (81 mg total) by mouth daily. Swallow whole.    Dispense:  90 tablet    Refill:  3    Chief Complaint  Patient presents with  . Follow-up  . Hypertension    History of Present Illness:    LISETH WANN is a 65 y.o. female with a hx of resistant hypertension on 5 separate antihypertensive agents last seen 02/18/2020. Compliance with diet, lifestyle and medications: Yes  Home blood pressure runs less than 140/80.  Her only complaint is a little bit of pedal edema and I will reduce her calcium channel blocker to once daily.  No chest pain palpitation shortness of breath side effects of her medications.  I reviewed recent labs with her  She had a  Myoview performed March 2021 with normal perfusion and function EF 63% low risk test And renal vascular duplex performed 11/08/2019 kidneys without findings of renal artery stenosis in the abdominal aorta was normal in caliber 2.3 cm.  Renal vascular resistive index was abnormal bilaterally.  She had labs performed PCP [facility] 06/21/2020: Hemoglobin 14.2 CMP creatinine 0.90 potassium 5.1 sodium 140 Past Medical History:  Diagnosis Date  . Abdominal pain, epigastric 01/15/2016  . Acute gout involving toe of right foot 09/01/2018  . Acute idiopathic gout involving toe of left foot 10/09/2017  . Adjustment insomnia 06/18/2018  . Allergy   . Anxiety   . Apnea 11/08/2019  . Arthritis    knees  . Chest pain 09/14/2019  . Cough 02/19/2018  . Diabetes mellitus without complication (HCC)   . Elevated uric acid in blood 12/09/2017  . Encounter for health maintenance examination with abnormal findings 02/06/2018  . Essential hypertension   . H/O seasonal allergies   . Hand dermatitis 06/18/2018  . History of kidney stones   . Hyperlipidemia   . Hypertension   . Lichen simplex chronicus 06/18/2018  . Low back pain 05/06/2019  . Mixed hyperlipidemia   . Morbid obesity (HCC) 12/09/2017  . Need for vaccination against Streptococcus pneumoniae using pneumococcal conjugate vaccine 13 06/18/2018  . OA (osteoarthritis) of knee 10/09/2017  . Pneumonia   . Reactive airway disease 02/03/2018  .  Renal calculus, right   . Right nephrolithiasis 01/15/2016  . Type 2 diabetes mellitus (HCC)   . UTI symptoms 05/06/2019  . Vitamin D deficiency 02/06/2018  . Wears contact lenses     Past Surgical History:  Procedure Laterality Date  . ABDOMINAL HYSTERECTOMY    . CHOLECYSTECTOMY N/A 01/16/2016   Procedure: LAPAROSCOPIC CHOLECYSTECTOMY WITH INTRAOPERATIVE CHOLANGIOGRAM;  Surgeon: Ovidio Kin, MD;  Location: [facility];  Service: General;  Laterality: N/A;  . CYSTOSCOPY WITH RETROGRADE PYELOGRAM, URETEROSCOPY AND STENT  PLACEMENT Right 03/07/2016   Procedure: CYSTOSCOPY WITH RETROGRADE PYELOGRAM, URETEROSCOPY AND STENT PLACEMENT ;  Surgeon: Bjorn Pippin, MD;  Location: [facility];  Service: Urology;  Laterality: Right;  . CYSTOSCOPY WITH RETROGRADE PYELOGRAM, URETEROSCOPY AND STENT PLACEMENT Right 03/21/2016   Procedure: CYSTOSCOPY WITH RIGHT RETROGRADE PYELOGRAM, RIGHT URETEROSCOPY WITH LASER  AND STENT PLACEMENT;  Surgeon: Bjorn Pippin, MD;  Location: [facility];  Service: Urology;  Laterality: Right;  . HOLMIUM LASER APPLICATION N/A 03/21/2016   Procedure: HOLMIUM LASER APPLICATION;  Surgeon: Bjorn Pippin, MD;  Location: [facility];  Service: Urology;  Laterality: N/A;  . TOTAL ABDOMINAL HYSTERECTOMY W/ BILATERAL SALPINGOOPHORECTOMY  08/1996    Current Medications: Current Meds  Medication Sig  . allopurinol (ZYLOPRIM) 100 MG tablet Take 1 tablet (100 mg total) by mouth daily.  Marland Kitchen ALPRAZolam (XANAX) 1 MG tablet Take 1 tablet (1 mg total) by mouth at bedtime as needed for anxiety.  Marland Kitchen amLODipine (NORVASC) 5 MG tablet TAKE 1 TABLET BY MOUTH TWICE A DAY  . aspirin EC 81 MG tablet Take 1 tablet (81 mg total) by mouth daily. Swallow whole.  . carvedilol (COREG) 12.5 MG tablet Take 1 tablet (12.5 mg total) by mouth 2 (two) times daily.  . cetirizine (ZYRTEC) 10 MG tablet Take 10 mg by mouth daily.  . cloNIDine (CATAPRES) 0.1 MG tablet Take 2 tablets (0.2 mg total) by mouth at bedtime.  . Colchicine (MITIGARE) 0.6 MG CAPS Take 1 capsule by mouth 2 (two) times daily. (Patient taking differently: Take 1 capsule by mouth 2 (two) times daily as needed.)  . colesevelam (WELCHOL) 625 MG tablet TAKE 3 TABLETS BY MOUTH TWICE A DAY *PLEASE SCHEDULE VISIT FOR FUTURE FILLS  . metFORMIN (GLUCOPHAGE-XR) 500 MG 24 hr tablet TAKE 2 TABLETS .BY MOUTH AT BEDTIME.  Marland Kitchen spironolactone (ALDACTONE) 25 MG tablet Take 1 tablet (25 mg total) by mouth daily.  Marland Kitchen telmisartan (MICARDIS) 40 MG tablet Take 1 tablet (40 mg total) by mouth daily.      Allergies:   Indomethacin, Statins, and Dexamethasone   Social History   Socioeconomic History  . Marital status: Married    Spouse name: Not on file  . Number of children: Not on file  . Years of education: Not on file  . Highest education level: Not on file  Occupational History  . Not on file  Tobacco Use  . Smoking status: Never Smoker  . Smokeless tobacco: Never Used  Vaping Use  . Vaping Use: Never used  Substance and Sexual Activity  . Alcohol use: Yes    Comment: rare  . Drug use: No  . Sexual activity: Yes    Birth control/protection: Surgical  Other Topics Concern  . Not on file  Social History Narrative  . Not on file   Social Determinants of Health   Financial Resource Strain: Not on file  Food Insecurity: Not on file  Transportation Needs: Not on file  Physical Activity: Not on file  Stress: Not on file  Social Connections: Not on file     Family History: The patient's family history includes Aneurysm in her father; Breast cancer in her mother; Kidney Stones in her father. ROS:   Please see the history of present illness.    All other systems reviewed and are negative.  EKGs/Labs/Other Studies Reviewed:    The following studies were reviewed today:   Recent Labs: 09/14/2019: TSH 1.73 09/15/2019: Hemoglobin 15.0; Platelets 366 02/18/2020: ALT 25; BUN 17; Creatinine, Ser 0.93; Potassium 4.9; Sodium 139  Recent Lipid Panel    Component Value Date/Time   CHOL 185 02/18/2020 0823   TRIG 157 (H) 02/18/2020 0823   HDL 42 02/18/2020 0823   CHOLHDL 4.4 02/18/2020 0823   CHOLHDL 4 02/19/2019 0904   VLDL 35.4 02/19/2019 0904   LDLCALC 115 (H) 02/18/2020 0823   LDLDIRECT 179.0 09/14/2019 1218    Physical Exam:    VS:  BP 138/78   Pulse 72   Ht 5\' 2"  (1.575 m)   Wt 232 lb 1.3 oz (105.3 kg)   SpO2 96%   BMI 42.45 kg/m     Wt Readings from Last 3 Encounters:  08/29/20 232 lb 1.3 oz (105.3 kg)  06/27/20 225 lb (102.1 kg)  02/18/20 232 lb 1.9  oz (105.3 kg)     GEN:  Well nourished, well developed in no acute distress HEENT: Normal NECK: No JVD; No carotid bruits LYMPHATICS: No lymphadenopathy CARDIAC: RRR, no murmurs, rubs, gallops RESPIRATORY:  Clear to auscultation without rales, wheezing or rhonchi  ABDOMEN: Soft, non-tender, non-distended MUSCULOSKELETAL:  No edema; No deformity  SKIN: Warm and dry NEUROLOGIC:  Alert and oriented x 3 PSYCHIATRIC:  Normal affect    Signed, 02/20/20, MD  08/29/2020 8:28 AM    [facility]

## 2020-08-29 ENCOUNTER — Other Ambulatory Visit: Payer: Self-pay

## 2020-08-29 ENCOUNTER — Ambulatory Visit: Payer: BC Managed Care – PPO | Admitting: Cardiology

## 2020-08-29 ENCOUNTER — Encounter: Payer: Self-pay | Admitting: Cardiology

## 2020-08-29 VITALS — BP 138/78 | HR 72 | Ht 62.0 in | Wt 232.1 lb

## 2020-08-29 DIAGNOSIS — E119 Type 2 diabetes mellitus without complications: Secondary | ICD-10-CM | POA: Diagnosis not present

## 2020-08-29 DIAGNOSIS — E782 Mixed hyperlipidemia: Secondary | ICD-10-CM

## 2020-08-29 DIAGNOSIS — I10 Essential (primary) hypertension: Secondary | ICD-10-CM

## 2020-08-29 DIAGNOSIS — Z789 Other specified health status: Secondary | ICD-10-CM

## 2020-08-29 MED ORDER — ASPIRIN EC 81 MG PO TBEC: 81.0000 mg | DELAYED_RELEASE_TABLET | Freq: Every day | ORAL | 3 refills | Status: AC

## 2020-08-29 MED ORDER — AMLODIPINE BESYLATE 5 MG PO TABS
5.0000 mg | ORAL_TABLET | Freq: Every day | ORAL | 1 refills | Status: DC
Start: 2020-08-29 — End: 2020-10-24

## 2020-08-29 NOTE — Patient Instructions (Signed)
Medication Instructions:  Your physician has recommended you make the following change in your medication:  START: Aspirin 81 mg take one tablet by mouth daily.  *If you need a refill on your cardiac medications before your next appointment, please call your pharmacy*   Lab Work: None If you have labs (blood work) drawn today and your tests are completely normal, you will receive your results only by: Marland Kitchen MyChart Message (if you have MyChart) OR . A paper copy in the mail If you have any lab test that is abnormal or we need to change your treatment, we will call you to review the results.   Testing/Procedures: None   Follow-Up: At [facility], you and your health needs are our priority.  As part of our continuing mission to provide you with exceptional heart care, we have created designated Provider Care Teams.  These Care Teams include your primary Cardiologist (physician) and Advanced Practice Providers (APPs -  Physician Assistants and Nurse Practitioners) who all work together to provide you with the care you need, when you need it.  We recommend signing up for the patient portal called "MyChart".  Sign up information is provided on this After Visit Summary.  MyChart is used to connect with patients for Virtual Visits (Telemedicine).  Patients are able to view lab/test results, encounter notes, upcoming appointments, etc.  Non-urgent messages can be sent to your provider as well.   To learn more about what you can do with MyChart, go to [email or web address].    Your next appointment:   6 month(s)  The format for your next appointment:   In Person  Provider:   Norman Herrlich, MD   Other Instructions

## 2020-09-19 ENCOUNTER — Other Ambulatory Visit: Payer: Self-pay | Admitting: Cardiology

## 2020-09-19 NOTE — Telephone Encounter (Signed)
Refill sent to pharmacy.   

## 2020-09-28 IMAGING — CR DG CHEST 2V
2 series · 2 of 2 positions shown · non-contrast
Comparison: February 19, 2018

CLINICAL DATA: Chest pain and hypertension

EXAM:
CHEST - 2 VIEW

[w chest pa]
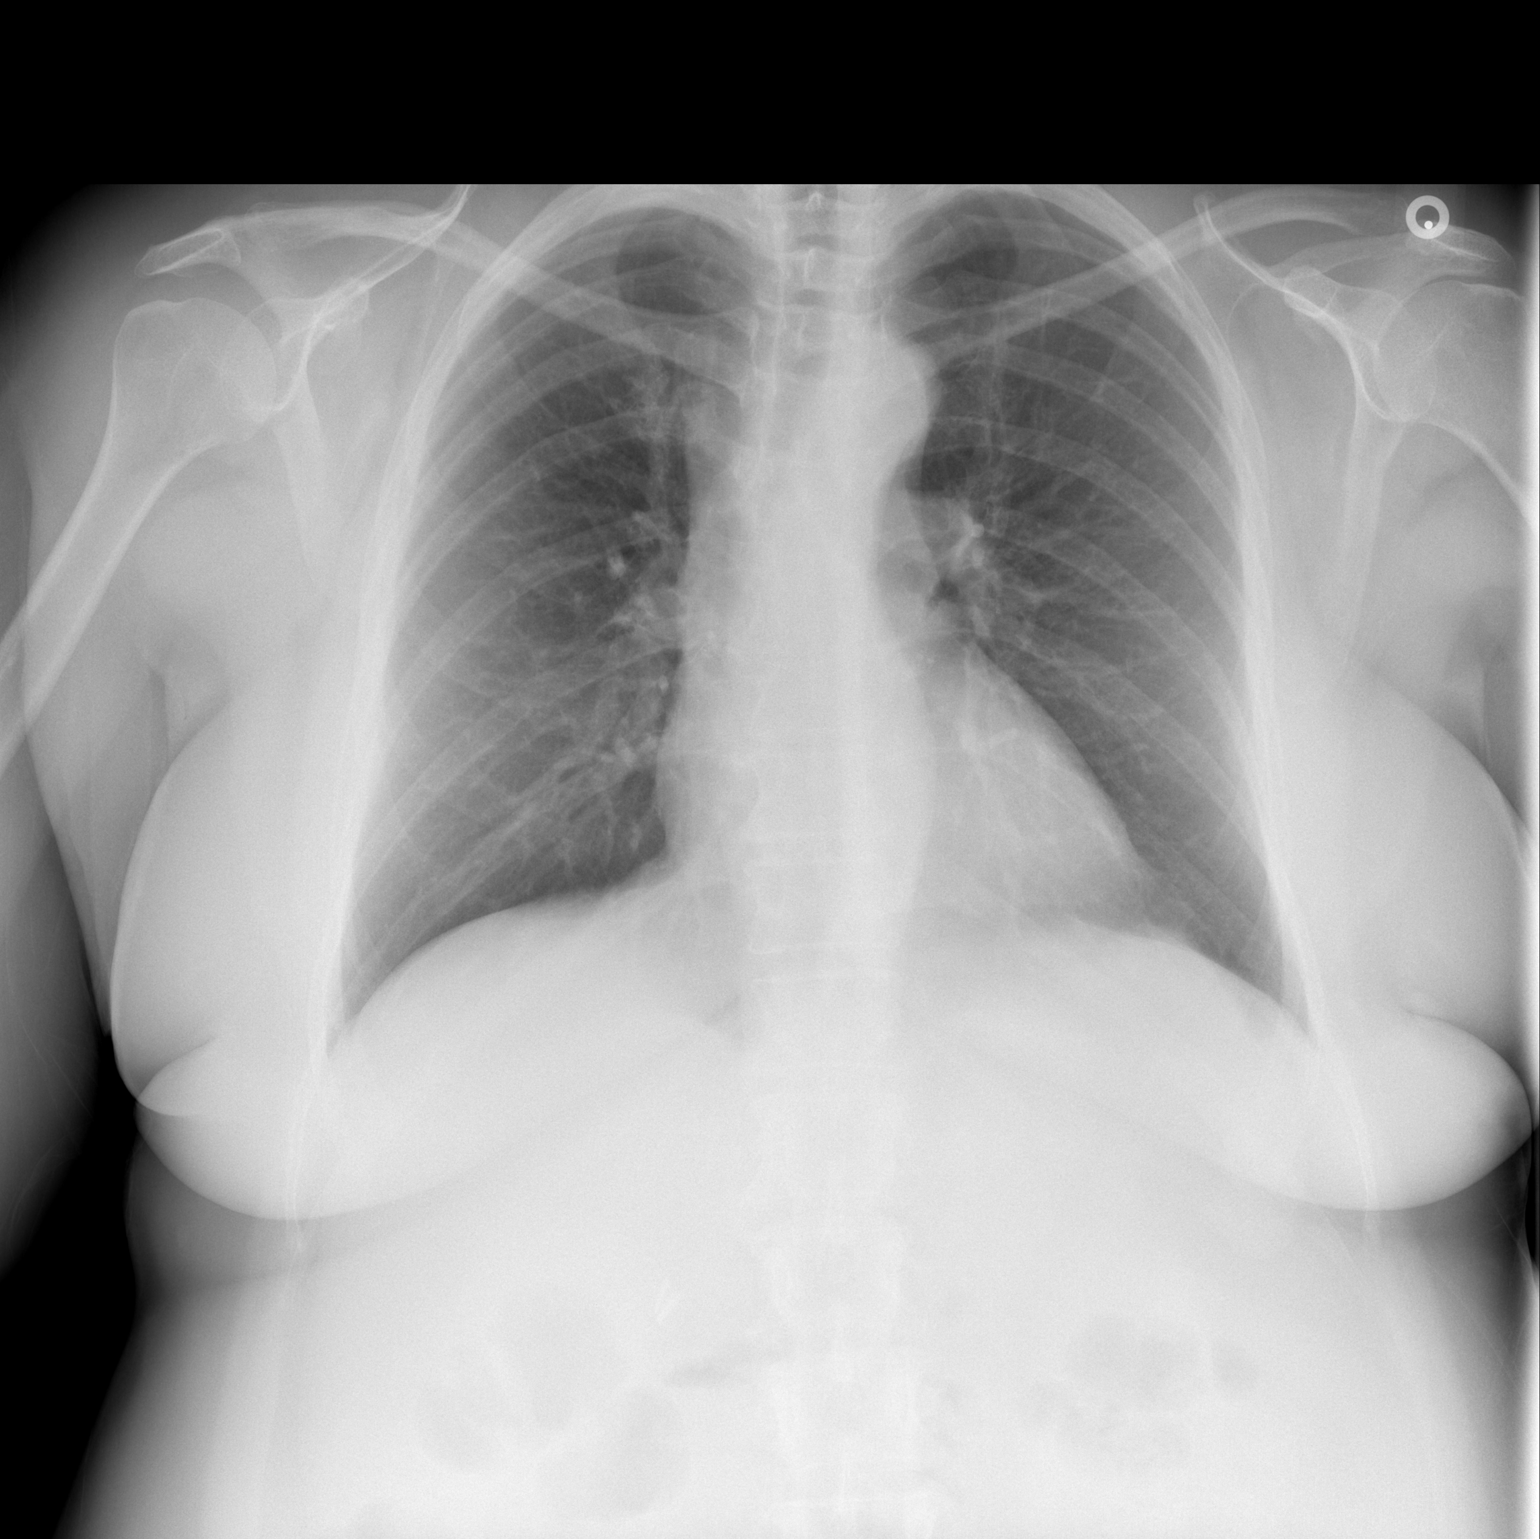

[w chest lat]
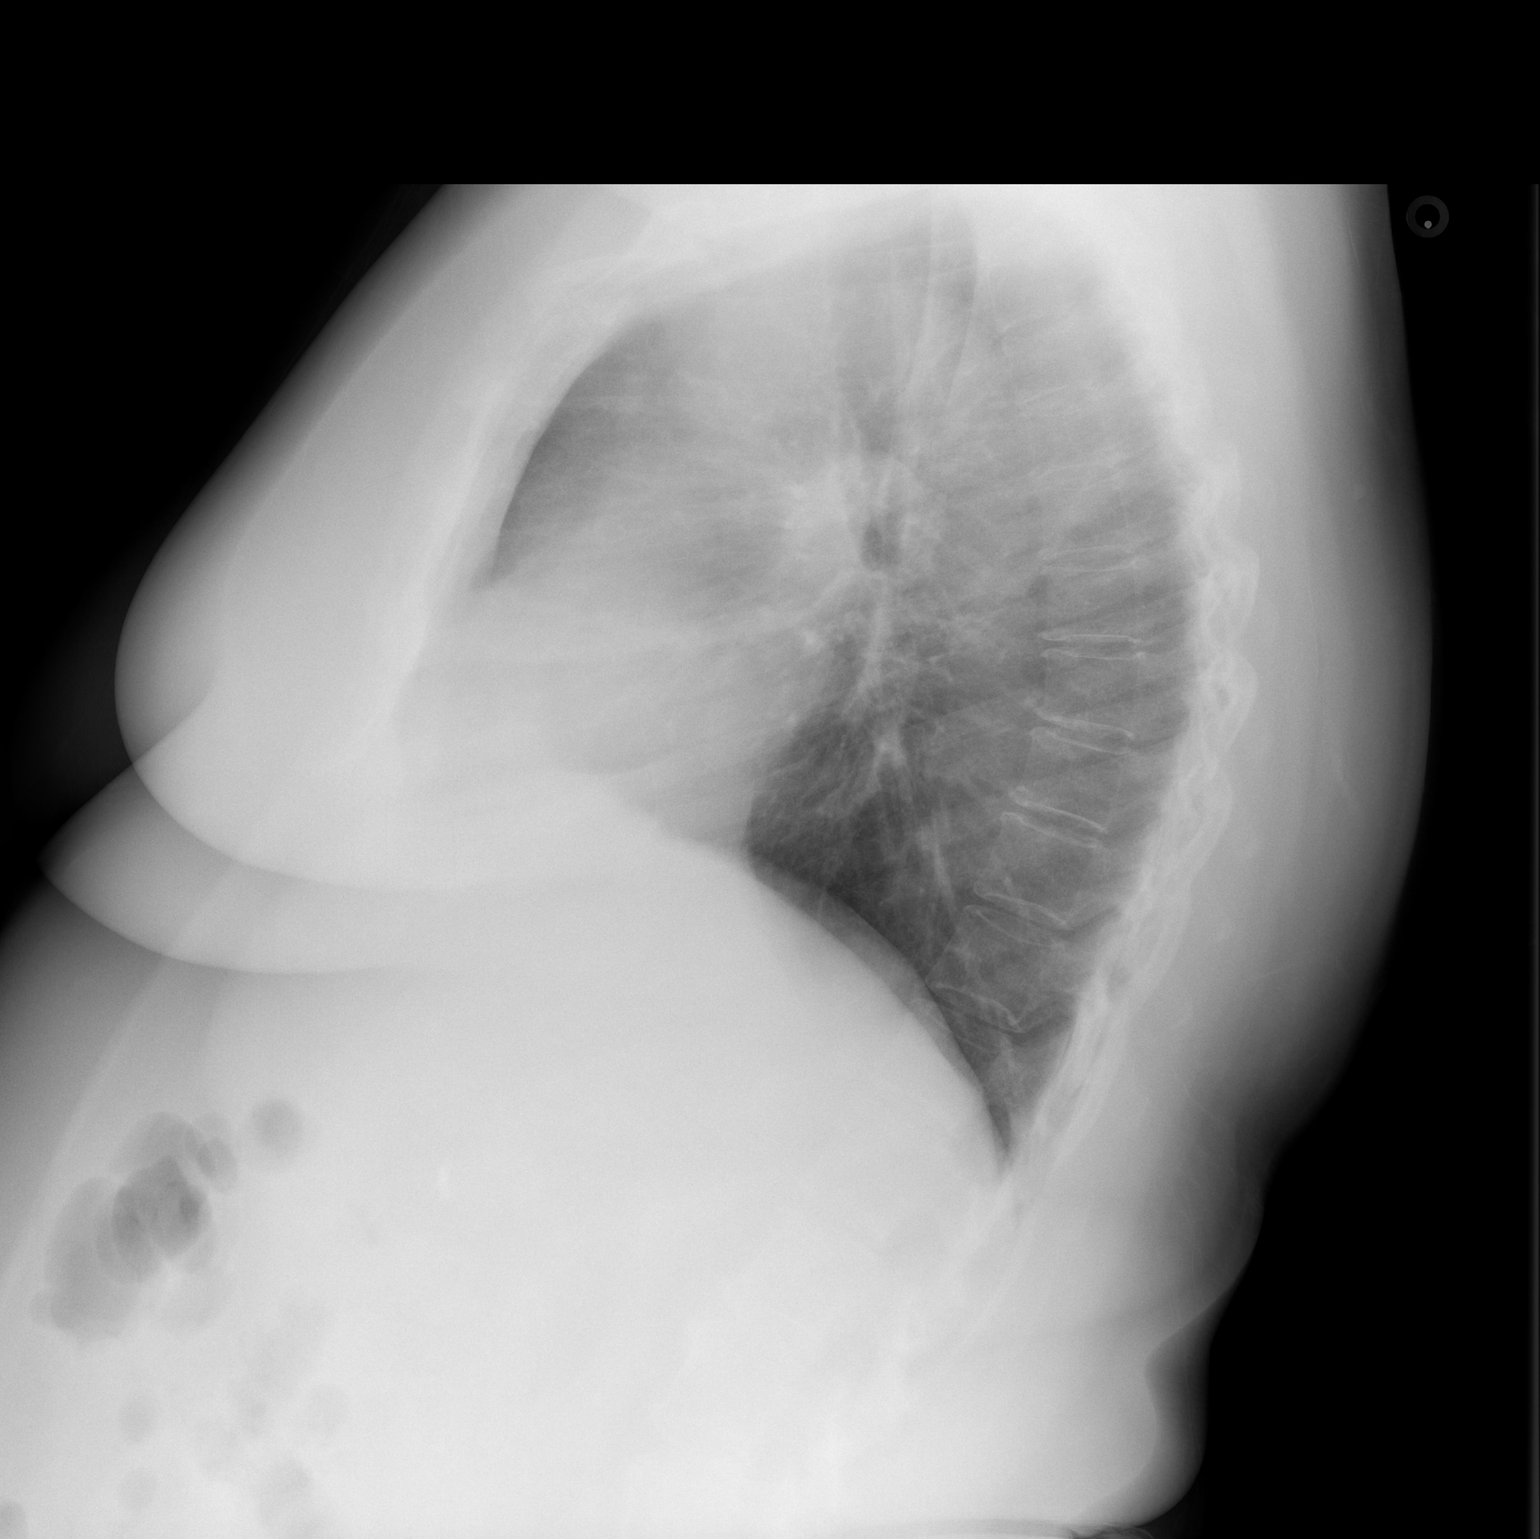

[2 of 2 positions shown; findings below may reference images not displayed]

FINDINGS: The lungs are clear. The heart size and pulmonary vascularity are
normal. No adenopathy. No pneumothorax. No bone lesions.
IMPRESSION: No abnormality noted.

## 2020-10-02 DIAGNOSIS — G4700 Insomnia, unspecified: Secondary | ICD-10-CM | POA: Diagnosis not present

## 2020-10-02 DIAGNOSIS — I10 Essential (primary) hypertension: Secondary | ICD-10-CM | POA: Diagnosis not present

## 2020-10-02 DIAGNOSIS — E119 Type 2 diabetes mellitus without complications: Secondary | ICD-10-CM | POA: Diagnosis not present

## 2020-10-19 ENCOUNTER — Other Ambulatory Visit: Payer: Self-pay

## 2020-10-19 DIAGNOSIS — I10 Essential (primary) hypertension: Secondary | ICD-10-CM

## 2020-10-19 MED ORDER — CLONIDINE HCL 0.1 MG PO TABS: 0.2000 mg | ORAL_TABLET | Freq: Every day | ORAL | 2 refills | Status: DC

## 2020-10-19 NOTE — Telephone Encounter (Signed)
Refill of Clonidine 0.1 mg sent to [facility].

## 2020-10-24 ENCOUNTER — Other Ambulatory Visit: Payer: Self-pay | Admitting: Cardiovascular Disease

## 2020-10-24 NOTE — Telephone Encounter (Signed)
Refill request

## 2020-11-21 ENCOUNTER — Other Ambulatory Visit: Payer: Self-pay | Admitting: Family Medicine

## 2020-11-21 DIAGNOSIS — E119 Type 2 diabetes mellitus without complications: Secondary | ICD-10-CM

## 2021-02-07 DIAGNOSIS — M25569 Pain in unspecified knee: Secondary | ICD-10-CM | POA: Diagnosis not present

## 2021-02-07 DIAGNOSIS — E119 Type 2 diabetes mellitus without complications: Secondary | ICD-10-CM | POA: Diagnosis not present

## 2021-02-07 DIAGNOSIS — I10 Essential (primary) hypertension: Secondary | ICD-10-CM | POA: Diagnosis not present

## 2021-02-07 DIAGNOSIS — Z23 Encounter for immunization: Secondary | ICD-10-CM | POA: Diagnosis not present

## 2021-02-22 DIAGNOSIS — I10 Essential (primary) hypertension: Secondary | ICD-10-CM | POA: Diagnosis not present

## 2021-02-22 DIAGNOSIS — M25561 Pain in right knee: Secondary | ICD-10-CM | POA: Diagnosis not present

## 2021-02-22 DIAGNOSIS — M1711 Unilateral primary osteoarthritis, right knee: Secondary | ICD-10-CM | POA: Diagnosis not present

## 2021-02-22 DIAGNOSIS — Z6841 Body Mass Index (BMI) 40.0 and over, adult: Secondary | ICD-10-CM | POA: Diagnosis not present

## 2021-03-05 NOTE — Progress Notes (Signed)
Cardiology Office Note:    Date:  03/06/2021   ID:  Kathy [name], DOB April 12, 1956, MRN [id number]  PCP:  Macy Mis, MD  Cardiologist:  Norman Herrlich, MD    Referring MD: Macy Mis, MD    ASSESSMENT:    1. Resistant hypertension   2. Mixed hyperlipidemia   3. Statin intolerance    PLAN:    In order of problems listed above:  BP is above target restart calcium channel blocker continue to trend blood pressure at home 6 weeks we will check a lipid profile and recheck her renal function.  She is on a multidrug regimen including distal diuretic ARB essential active clonidine carvedilol High risk type 2 diabetes we will initiate bempedoic acid 180 mg daily if intolerant PCSK9 inhibitor and discontinue WelChol Well-controlled diabetes on appropriate therapy with metformin   Next appointment: 1 year   Medication Adjustments/Labs and Tests Ordered: Current medicines are reviewed at length with the patient today.  Concerns regarding medicines are outlined above.  No orders of the defined types were placed in this encounter.  No orders of the defined types were placed in this encounter.   Chief Complaint  Patient presents with   Follow-up   Hypertension    History of Present Illness:    Kathy [name] is a 65 y.o. female with a hx of resistant hypertension hyperlipidemia and statin intolerance and type 2 diabetes last seen 08/29/2020.  She is statin intolerant and previously had been on Zetia from chart review I am unsure why this was discontinued.  On questioning she told me she had the same muscle symptoms.   Compliance with diet, lifestyle and medications: Yes  She continues to have knee pain and is taking nonsteroidal anti-inflammatory drug Home blood pressures running in the range of 140 150/80 and she would like to retry amlodipine low-dose. No edema chest pain shortness of breath palpitation or syncope.  She had a Myoview performed March 2021 with normal  perfusion and function EF 63% low risk test A renal vascular duplex performed 11/08/2019 showed the kidneys without findings of renal artery stenosis and the abdominal aorta was normal in caliber 2.3 cm.  Renal vascular resistive index was abnormal bilaterally.  Labs 02/07/2021 hemoglobin A1c 6.7 06/21/2020 creatinine 0.9 potassium 5.1 sodium 140 GFR 67 cc/min Past Medical History:  Diagnosis Date   Abdominal pain, epigastric 01/15/2016   Acute gout involving toe of right foot 09/01/2018   Acute idiopathic gout involving toe of left foot 10/09/2017   Adjustment insomnia 06/18/2018   Allergy    Anxiety    Apnea 11/08/2019   Arthritis    knees   Chest pain 09/14/2019   Cough 02/19/2018   Diabetes mellitus without complication (HCC)    Elevated uric acid in blood 12/09/2017   Encounter for health maintenance examination with abnormal findings 02/06/2018   Essential hypertension    H/O seasonal allergies    Hand dermatitis 06/18/2018   History of kidney stones    Hyperlipidemia    Hypertension    Lichen simplex chronicus 06/18/2018   Low back pain 05/06/2019   Mixed hyperlipidemia    Morbid obesity (HCC) 12/09/2017   Need for vaccination against Streptococcus pneumoniae using pneumococcal conjugate vaccine 13 06/18/2018   OA (osteoarthritis) of knee 10/09/2017   Pneumonia    Reactive airway disease 02/03/2018   Renal calculus, right    Right nephrolithiasis 01/15/2016   Type 2 diabetes mellitus (HCC)    UTI  symptoms 05/06/2019   Vitamin D deficiency 02/06/2018   Wears contact lenses     Past Surgical History:  Procedure Laterality Date   ABDOMINAL HYSTERECTOMY     CHOLECYSTECTOMY N/A 01/16/2016   Procedure: LAPAROSCOPIC CHOLECYSTECTOMY WITH INTRAOPERATIVE CHOLANGIOGRAM;  Surgeon: Ovidio Kin, MD;  Location: [facility];  Service: General;  Laterality: N/A;   CYSTOSCOPY WITH RETROGRADE PYELOGRAM, URETEROSCOPY AND STENT PLACEMENT Right 03/07/2016   Procedure: CYSTOSCOPY WITH RETROGRADE PYELOGRAM,  URETEROSCOPY AND STENT PLACEMENT ;  Surgeon: Bjorn Pippin, MD;  Location: [facility];  Service: Urology;  Laterality: Right;   CYSTOSCOPY WITH RETROGRADE PYELOGRAM, URETEROSCOPY AND STENT PLACEMENT Right 03/21/2016   Procedure: CYSTOSCOPY WITH RIGHT RETROGRADE PYELOGRAM, RIGHT URETEROSCOPY WITH LASER  AND STENT PLACEMENT;  Surgeon: Bjorn Pippin, MD;  Location: [facility];  Service: Urology;  Laterality: Right;   HOLMIUM LASER APPLICATION N/A 03/21/2016   Procedure: HOLMIUM LASER APPLICATION;  Surgeon: Bjorn Pippin, MD;  Location: [facility];  Service: Urology;  Laterality: N/A;   TOTAL ABDOMINAL HYSTERECTOMY W/ BILATERAL SALPINGOOPHORECTOMY  08/1996    Current Medications: Current Meds  Medication Sig   allopurinol (ZYLOPRIM) 100 MG tablet Take 1 tablet (100 mg total) by mouth daily.   ALPRAZolam (XANAX) 1 MG tablet Take 1 tablet (1 mg total) by mouth at bedtime as needed for anxiety.   amLODipine (NORVASC) 5 MG tablet TAKE 1 TABLET BY MOUTH TWICE A DAY (Patient taking differently: Take 5 mg by mouth daily.)   aspirin EC 81 MG tablet Take 1 tablet (81 mg total) by mouth daily. Swallow whole.   carvedilol (COREG) 12.5 MG tablet TAKE 1 TABLET BY MOUTH 2 TIMES DAILY.   cetirizine (ZYRTEC) 10 MG tablet Take 10 mg by mouth daily.   cloNIDine (CATAPRES) 0.1 MG tablet Take 2 tablets (0.2 mg total) by mouth at bedtime.   Colchicine (MITIGARE) 0.6 MG CAPS Take 1 capsule by mouth 2 (two) times daily. (Patient taking differently: Take 1 capsule by mouth 2 (two) times daily as needed.)   colesevelam (WELCHOL) 625 MG tablet TAKE 3 TABLETS BY MOUTH TWICE A DAY *PLEASE SCHEDULE VISIT FOR FUTURE FILLS   metFORMIN (GLUCOPHAGE-XR) 500 MG 24 hr tablet TAKE 2 TABLETS .BY MOUTH AT BEDTIME.   spironolactone (ALDACTONE) 25 MG tablet TAKE 1 TABLET BY MOUTH EVERY DAY   telmisartan (MICARDIS) 40 MG tablet TAKE 1 TABLET BY MOUTH EVERY DAY     Allergies:   Indomethacin, Statins, and Dexamethasone   Social History    Socioeconomic History   Marital status: Married    Spouse name: Not on file   Number of children: Not on file   Years of education: Not on file   Highest education level: Not on file  Occupational History   Not on file  Tobacco Use   Smoking status: Never   Smokeless tobacco: Never  Vaping Use   Vaping Use: Never used  Substance and Sexual Activity   Alcohol use: Yes    Comment: rare   Drug use: No   Sexual activity: Yes    Birth control/protection: Surgical  Other Topics Concern   Not on file  Social History Narrative   Not on file   Social Determinants of Health   Financial Resource Strain: Not on file  Food Insecurity: Not on file  Transportation Needs: Not on file  Physical Activity: Not on file  Stress: Not on file  Social Connections: Not on file     Family History: The patient's family history includes  Aneurysm in her father; Breast cancer in her mother; Kidney Stones in her father. ROS:   Please see the history of present illness.    All other systems reviewed and are negative.  EKGs/Labs/Other Studies Reviewed:    The following studies were reviewed today:  EKG:  EKG ordered today and personally reviewed.  The ekg ordered today demonstrates sinus rhythm poor R wave progression  Recent Labs: No results found for requested labs within last 8760 hours.  Recent Lipid Panel    Component Value Date/Time   CHOL 185 02/18/2020 0823   TRIG 157 (H) 02/18/2020 0823   HDL 42 02/18/2020 0823   CHOLHDL 4.4 02/18/2020 0823   CHOLHDL 4 02/19/2019 0904   VLDL 35.4 02/19/2019 0904   LDLCALC 115 (H) 02/18/2020 0823   LDLDIRECT 179.0 09/14/2019 1218    Physical Exam:    VS:  BP (!) 147/82 (BP Location: Left Wrist, Patient Position: Sitting)   Pulse 64   Ht 5\' 2"  (1.575 m)   Wt 233 lb (105.7 kg)   SpO2 97%   BMI 42.62 kg/m     Wt Readings from Last 3 Encounters:  03/06/21 233 lb (105.7 kg)  08/29/20 232 lb 1.3 oz (105.3 kg)  06/27/20 225 lb (102.1 kg)      GEN: Obese BMI 42 well nourished, well developed in no acute distress HEENT: Normal NECK: No JVD; No carotid bruits LYMPHATICS: No lymphadenopathy CARDIAC: RRR, no murmurs, rubs, gallops RESPIRATORY:  Clear to auscultation without rales, wheezing or rhonchi  ABDOMEN: Soft, non-tender, non-distended MUSCULOSKELETAL:  No edema; No deformity  SKIN: Warm and dry NEUROLOGIC:  Alert and oriented x 3 PSYCHIATRIC:  Normal affect    Signed, 06/29/20, MD  03/06/2021 8:27 AM    [facility]

## 2021-03-06 ENCOUNTER — Ambulatory Visit: Payer: BC Managed Care – PPO | Admitting: Cardiology

## 2021-03-06 ENCOUNTER — Other Ambulatory Visit: Payer: Self-pay

## 2021-03-06 ENCOUNTER — Other Ambulatory Visit: Payer: Self-pay | Admitting: Cardiology

## 2021-03-06 ENCOUNTER — Telehealth: Payer: Self-pay | Admitting: *Deleted

## 2021-03-06 ENCOUNTER — Encounter: Payer: Self-pay | Admitting: Cardiology

## 2021-03-06 VITALS — BP 147/82 | HR 64 | Ht 62.0 in | Wt 233.0 lb

## 2021-03-06 DIAGNOSIS — Z789 Other specified health status: Secondary | ICD-10-CM

## 2021-03-06 DIAGNOSIS — E782 Mixed hyperlipidemia: Secondary | ICD-10-CM | POA: Diagnosis not present

## 2021-03-06 DIAGNOSIS — I10 Essential (primary) hypertension: Secondary | ICD-10-CM

## 2021-03-06 MED ORDER — AMLODIPINE BESYLATE 5 MG PO TABS: 5.0000 mg | ORAL_TABLET | Freq: Every day | ORAL | 3 refills | Status: DC

## 2021-03-06 MED ORDER — BEMPEDOIC ACID 180 MG PO TABS: 180.0000 mg | ORAL_TABLET | Freq: Every day | ORAL | 3 refills | Status: DC

## 2021-03-06 MED ORDER — AMLODIPINE BESYLATE 5 MG PO TABS: 5.0000 mg | ORAL_TABLET | Freq: Two times a day (BID) | ORAL | 3 refills | Status: DC

## 2021-03-06 NOTE — Telephone Encounter (Signed)
Spoke with pt on phone after she left before AVS was given. Discussed with DR. Munley about taking the Amlodipine 5mg  twice a day instead of once per day. Dr. agreed this would be fine but to look out for swelling. Sent in the Rx for bid daily to pharmacy. Emphasized with patient the importance of looking out for the edema and to contact office if this occurs. Pt was agreeable with plan.

## 2021-03-06 NOTE — Telephone Encounter (Signed)
Prior Authorization for Nexletol started

## 2021-03-06 NOTE — Patient Instructions (Signed)
Medication Instructions:  Your physician has recommended you make the following change in your medication:  START BEMPEDOIC ACID (NEXLETOL) 180 MG ONCE DAILY START AMLODIPINE 5 MG TWO TIMES DAILY DISCONTINUE WELCHOL  *If you need a refill on your cardiac medications before your next appointment, please call your pharmacy*   Lab Work: Your physician recommends that you return for lab work in: 6 WEEKS FOR COMPLETE METABOLIC PANEL AND LIPID PANEL  If you have labs (blood work) drawn today and your tests are completely normal, you will receive your results only by: MyChart Message (if you have MyChart) OR A paper copy in the mail If you have any lab test that is abnormal or we need to change your treatment, we will call you to review the results.   Testing/Procedures: EKG   Follow-Up: At [facility], you and your health needs are our priority.  As part of our continuing mission to provide you with exceptional heart care, we have created designated Provider Care Teams.  These Care Teams include your primary Cardiologist (physician) and Advanced Practice Providers (APPs -  Physician Assistants and Nurse Practitioners) who all work together to provide you with the care you need, when you need it.  We recommend signing up for the patient portal called "MyChart".  Sign up information is provided on this After Visit Summary.  MyChart is used to connect with patients for Virtual Visits (Telemedicine).  Patients are able to view lab/test results, encounter notes, upcoming appointments, etc.  Non-urgent messages can be sent to your provider as well.   To learn more about what you can do with MyChart, go to [email or web address].    Your next appointment:   1 year(s)  The format for your next appointment:   In Person  Provider:   Norman Herrlich, MD

## 2021-03-12 ENCOUNTER — Telehealth: Payer: Self-pay

## 2021-03-12 NOTE — Telephone Encounter (Signed)
Nexletol denied, does not meet criteria to approve med.

## 2021-03-16 ENCOUNTER — Telehealth: Payer: Self-pay

## 2021-03-16 NOTE — Telephone Encounter (Signed)
Nexletol appeal was denied . Does not meet insurance criteria. Ref#[id number]

## 2021-04-04 ENCOUNTER — Telehealth: Payer: Self-pay

## 2021-04-04 NOTE — Telephone Encounter (Signed)
Consent form from [facility]  appeal process faxed

## 2021-04-30 DIAGNOSIS — I10 Essential (primary) hypertension: Secondary | ICD-10-CM | POA: Diagnosis not present

## 2021-04-30 DIAGNOSIS — Z6841 Body Mass Index (BMI) 40.0 and over, adult: Secondary | ICD-10-CM | POA: Diagnosis not present

## 2021-04-30 DIAGNOSIS — E119 Type 2 diabetes mellitus without complications: Secondary | ICD-10-CM | POA: Diagnosis not present

## 2021-04-30 DIAGNOSIS — E669 Obesity, unspecified: Secondary | ICD-10-CM | POA: Diagnosis not present

## 2021-05-31 DIAGNOSIS — Z6841 Body Mass Index (BMI) 40.0 and over, adult: Secondary | ICD-10-CM | POA: Diagnosis not present

## 2021-05-31 DIAGNOSIS — M1711 Unilateral primary osteoarthritis, right knee: Secondary | ICD-10-CM | POA: Diagnosis not present

## 2021-05-31 DIAGNOSIS — M2241 Chondromalacia patellae, right knee: Secondary | ICD-10-CM | POA: Diagnosis not present

## 2021-05-31 DIAGNOSIS — I10 Essential (primary) hypertension: Secondary | ICD-10-CM | POA: Diagnosis not present

## 2021-06-07 DIAGNOSIS — H26493 Other secondary cataract, bilateral: Secondary | ICD-10-CM | POA: Diagnosis not present

## 2021-06-07 DIAGNOSIS — E119 Type 2 diabetes mellitus without complications: Secondary | ICD-10-CM | POA: Diagnosis not present

## 2021-06-07 DIAGNOSIS — H04123 Dry eye syndrome of bilateral lacrimal glands: Secondary | ICD-10-CM | POA: Diagnosis not present

## 2021-06-07 DIAGNOSIS — Z7984 Long term (current) use of oral hypoglycemic drugs: Secondary | ICD-10-CM | POA: Diagnosis not present

## 2021-06-11 DIAGNOSIS — M179 Osteoarthritis of knee, unspecified: Secondary | ICD-10-CM | POA: Diagnosis not present

## 2021-06-11 DIAGNOSIS — E119 Type 2 diabetes mellitus without complications: Secondary | ICD-10-CM | POA: Diagnosis not present

## 2021-06-11 DIAGNOSIS — I10 Essential (primary) hypertension: Secondary | ICD-10-CM | POA: Diagnosis not present

## 2021-06-11 DIAGNOSIS — Z6841 Body Mass Index (BMI) 40.0 and over, adult: Secondary | ICD-10-CM | POA: Diagnosis not present

## 2021-06-21 DIAGNOSIS — E119 Type 2 diabetes mellitus without complications: Secondary | ICD-10-CM | POA: Diagnosis not present

## 2021-06-21 DIAGNOSIS — M1711 Unilateral primary osteoarthritis, right knee: Secondary | ICD-10-CM | POA: Diagnosis not present

## 2021-06-21 DIAGNOSIS — M2241 Chondromalacia patellae, right knee: Secondary | ICD-10-CM | POA: Diagnosis not present

## 2021-06-26 ENCOUNTER — Encounter: Payer: Self-pay | Admitting: Cardiology

## 2021-06-26 DIAGNOSIS — I10 Essential (primary) hypertension: Secondary | ICD-10-CM | POA: Diagnosis not present

## 2021-06-26 DIAGNOSIS — E785 Hyperlipidemia, unspecified: Secondary | ICD-10-CM | POA: Diagnosis not present

## 2021-06-26 DIAGNOSIS — Z0000 Encounter for general adult medical examination without abnormal findings: Secondary | ICD-10-CM | POA: Diagnosis not present

## 2021-06-26 DIAGNOSIS — M109 Gout, unspecified: Secondary | ICD-10-CM | POA: Diagnosis not present

## 2021-06-26 DIAGNOSIS — Z23 Encounter for immunization: Secondary | ICD-10-CM | POA: Diagnosis not present

## 2021-06-26 DIAGNOSIS — E119 Type 2 diabetes mellitus without complications: Secondary | ICD-10-CM | POA: Diagnosis not present

## 2021-07-01 ENCOUNTER — Other Ambulatory Visit: Payer: Self-pay | Admitting: Cardiology

## 2021-07-01 DIAGNOSIS — I10 Essential (primary) hypertension: Secondary | ICD-10-CM

## 2021-07-18 DIAGNOSIS — R110 Nausea: Secondary | ICD-10-CM | POA: Diagnosis not present

## 2021-08-02 DIAGNOSIS — Z1231 Encounter for screening mammogram for malignant neoplasm of breast: Secondary | ICD-10-CM | POA: Diagnosis not present

## 2021-08-14 DIAGNOSIS — M2241 Chondromalacia patellae, right knee: Secondary | ICD-10-CM | POA: Diagnosis not present

## 2021-08-14 DIAGNOSIS — M109 Gout, unspecified: Secondary | ICD-10-CM | POA: Diagnosis not present

## 2021-08-14 DIAGNOSIS — M25561 Pain in right knee: Secondary | ICD-10-CM | POA: Diagnosis not present

## 2021-08-14 DIAGNOSIS — Z79899 Other long term (current) drug therapy: Secondary | ICD-10-CM | POA: Diagnosis not present

## 2021-08-14 DIAGNOSIS — G4700 Insomnia, unspecified: Secondary | ICD-10-CM | POA: Diagnosis not present

## 2021-08-14 DIAGNOSIS — E785 Hyperlipidemia, unspecified: Secondary | ICD-10-CM | POA: Diagnosis not present

## 2021-08-14 DIAGNOSIS — M792 Neuralgia and neuritis, unspecified: Secondary | ICD-10-CM | POA: Diagnosis not present

## 2021-08-14 DIAGNOSIS — Z888 Allergy status to other drugs, medicaments and biological substances status: Secondary | ICD-10-CM | POA: Diagnosis not present

## 2021-08-14 DIAGNOSIS — Z7984 Long term (current) use of oral hypoglycemic drugs: Secondary | ICD-10-CM | POA: Diagnosis not present

## 2021-08-14 DIAGNOSIS — E119 Type 2 diabetes mellitus without complications: Secondary | ICD-10-CM | POA: Diagnosis not present

## 2021-08-14 DIAGNOSIS — G588 Other specified mononeuropathies: Secondary | ICD-10-CM | POA: Diagnosis not present

## 2021-08-14 DIAGNOSIS — Z7982 Long term (current) use of aspirin: Secondary | ICD-10-CM | POA: Diagnosis not present

## 2021-08-14 DIAGNOSIS — M1711 Unilateral primary osteoarthritis, right knee: Secondary | ICD-10-CM | POA: Diagnosis not present

## 2021-08-14 DIAGNOSIS — Z7985 Long-term (current) use of injectable non-insulin antidiabetic drugs: Secondary | ICD-10-CM | POA: Diagnosis not present

## 2021-08-14 DIAGNOSIS — Z6841 Body Mass Index (BMI) 40.0 and over, adult: Secondary | ICD-10-CM | POA: Diagnosis not present

## 2021-08-14 DIAGNOSIS — I10 Essential (primary) hypertension: Secondary | ICD-10-CM | POA: Diagnosis not present

## 2021-08-20 DIAGNOSIS — E119 Type 2 diabetes mellitus without complications: Secondary | ICD-10-CM | POA: Diagnosis not present

## 2021-08-20 DIAGNOSIS — E669 Obesity, unspecified: Secondary | ICD-10-CM | POA: Diagnosis not present

## 2021-08-20 DIAGNOSIS — M179 Osteoarthritis of knee, unspecified: Secondary | ICD-10-CM | POA: Diagnosis not present

## 2021-08-20 DIAGNOSIS — E785 Hyperlipidemia, unspecified: Secondary | ICD-10-CM | POA: Diagnosis not present

## 2021-09-10 ENCOUNTER — Other Ambulatory Visit: Payer: Self-pay

## 2021-09-10 MED ORDER — TELMISARTAN 40 MG PO TABS: 40.0000 mg | ORAL_TABLET | Freq: Every day | ORAL | 1 refills | Status: DC

## 2021-09-10 MED ORDER — SPIRONOLACTONE 25 MG PO TABS: 25.0000 mg | ORAL_TABLET | Freq: Every day | ORAL | 3 refills | Status: DC

## 2021-09-10 NOTE — Telephone Encounter (Signed)
Refill of Telmisartan 40 mg sent to [facility] [place].

## 2021-09-27 ENCOUNTER — Encounter: Payer: Self-pay | Admitting: Cardiology

## 2021-09-27 ENCOUNTER — Other Ambulatory Visit: Payer: Self-pay

## 2021-09-27 MED ORDER — CARVEDILOL 12.5 MG PO TABS: 12.5000 mg | ORAL_TABLET | Freq: Two times a day (BID) | ORAL | 1 refills | Status: DC

## 2021-09-27 MED ORDER — CARVEDILOL 12.5 MG PO TABS: 12.5000 mg | ORAL_TABLET | Freq: Two times a day (BID) | ORAL | 2 refills | Status: DC

## 2021-09-28 DIAGNOSIS — M1711 Unilateral primary osteoarthritis, right knee: Secondary | ICD-10-CM | POA: Diagnosis not present

## 2021-09-28 DIAGNOSIS — M2241 Chondromalacia patellae, right knee: Secondary | ICD-10-CM | POA: Diagnosis not present

## 2021-10-15 DIAGNOSIS — F1120 Opioid dependence, uncomplicated: Secondary | ICD-10-CM | POA: Diagnosis not present

## 2021-10-15 DIAGNOSIS — M1711 Unilateral primary osteoarthritis, right knee: Secondary | ICD-10-CM | POA: Diagnosis not present

## 2021-10-15 DIAGNOSIS — E119 Type 2 diabetes mellitus without complications: Secondary | ICD-10-CM | POA: Diagnosis not present

## 2021-10-15 DIAGNOSIS — L659 Nonscarring hair loss, unspecified: Secondary | ICD-10-CM | POA: Diagnosis not present

## 2021-10-15 DIAGNOSIS — I10 Essential (primary) hypertension: Secondary | ICD-10-CM | POA: Diagnosis not present

## 2021-10-29 DIAGNOSIS — E119 Type 2 diabetes mellitus without complications: Secondary | ICD-10-CM | POA: Diagnosis not present

## 2021-10-29 DIAGNOSIS — E785 Hyperlipidemia, unspecified: Secondary | ICD-10-CM | POA: Diagnosis not present

## 2021-10-29 DIAGNOSIS — I10 Essential (primary) hypertension: Secondary | ICD-10-CM | POA: Diagnosis not present

## 2021-10-29 DIAGNOSIS — M179 Osteoarthritis of knee, unspecified: Secondary | ICD-10-CM | POA: Diagnosis not present

## 2021-10-29 DIAGNOSIS — E669 Obesity, unspecified: Secondary | ICD-10-CM | POA: Diagnosis not present

## 2021-11-28 DIAGNOSIS — R21 Rash and other nonspecific skin eruption: Secondary | ICD-10-CM | POA: Diagnosis not present

## 2021-12-06 DIAGNOSIS — I10 Essential (primary) hypertension: Secondary | ICD-10-CM | POA: Diagnosis not present

## 2021-12-06 DIAGNOSIS — M2241 Chondromalacia patellae, right knee: Secondary | ICD-10-CM | POA: Diagnosis not present

## 2021-12-06 DIAGNOSIS — M1711 Unilateral primary osteoarthritis, right knee: Secondary | ICD-10-CM | POA: Diagnosis not present

## 2021-12-06 DIAGNOSIS — G894 Chronic pain syndrome: Secondary | ICD-10-CM | POA: Diagnosis not present

## 2022-01-07 ENCOUNTER — Encounter: Payer: Self-pay | Admitting: Cardiology

## 2022-01-07 DIAGNOSIS — E669 Obesity, unspecified: Secondary | ICD-10-CM | POA: Diagnosis not present

## 2022-01-07 DIAGNOSIS — M179 Osteoarthritis of knee, unspecified: Secondary | ICD-10-CM | POA: Diagnosis not present

## 2022-01-07 DIAGNOSIS — I10 Essential (primary) hypertension: Secondary | ICD-10-CM | POA: Diagnosis not present

## 2022-01-07 DIAGNOSIS — E119 Type 2 diabetes mellitus without complications: Secondary | ICD-10-CM | POA: Diagnosis not present

## 2022-02-07 ENCOUNTER — Other Ambulatory Visit: Payer: Self-pay

## 2022-02-07 MED ORDER — AMLODIPINE BESYLATE 5 MG PO TABS: 5.0000 mg | ORAL_TABLET | Freq: Two times a day (BID) | ORAL | 0 refills | Status: DC

## 2022-03-05 DIAGNOSIS — I10 Essential (primary) hypertension: Secondary | ICD-10-CM | POA: Diagnosis not present

## 2022-03-05 DIAGNOSIS — E1169 Type 2 diabetes mellitus with other specified complication: Secondary | ICD-10-CM | POA: Diagnosis not present

## 2022-03-05 DIAGNOSIS — E7800 Pure hypercholesterolemia, unspecified: Secondary | ICD-10-CM | POA: Diagnosis not present

## 2022-03-12 ENCOUNTER — Other Ambulatory Visit: Payer: Self-pay

## 2022-03-12 ENCOUNTER — Encounter: Payer: Self-pay | Admitting: Cardiology

## 2022-03-12 ENCOUNTER — Ambulatory Visit: Payer: BC Managed Care – PPO | Admitting: Cardiology

## 2022-03-12 VITALS — BP 132/78 | HR 80 | Ht 61.6 in | Wt 191.0 lb

## 2022-03-12 DIAGNOSIS — E782 Mixed hyperlipidemia: Secondary | ICD-10-CM

## 2022-03-12 DIAGNOSIS — E7800 Pure hypercholesterolemia, unspecified: Secondary | ICD-10-CM | POA: Insufficient documentation

## 2022-03-12 DIAGNOSIS — I10 Essential (primary) hypertension: Secondary | ICD-10-CM

## 2022-03-12 DIAGNOSIS — Z789 Other specified health status: Secondary | ICD-10-CM

## 2022-03-12 DIAGNOSIS — E1169 Type 2 diabetes mellitus with other specified complication: Secondary | ICD-10-CM | POA: Insufficient documentation

## 2022-03-12 DIAGNOSIS — E119 Type 2 diabetes mellitus without complications: Secondary | ICD-10-CM | POA: Diagnosis not present

## 2022-03-12 DIAGNOSIS — I1A0 Resistant hypertension: Secondary | ICD-10-CM

## 2022-03-12 NOTE — Progress Notes (Signed)
Cardiology Office Note:    Date:  03/12/2022   ID:  Kathy [name], DOB [birth year], MRN [id number]  PCP:  Macy Mis, MD  Cardiologist:  Norman Herrlich, MD    Referring MD: Macy Mis, MD    ASSESSMENT:    1. Resistant hypertension   2. Mixed hyperlipidemia   3. Diabetes mellitus without complication (HCC)   4. Statin intolerance    PLAN:    In order of problems listed above:  She is much improved with lifestyle change blood pressure is now well controlled once daily amlodipine clonidine at bedtime spironolactone and telmisartan. Unfortunately statin intolerant refer to lipid clinic I do cannot explain why she was refused for today but daughter casted by her and benefit manager if unable to access the drugs she needs a PCSK9 inhibitor with type 2 diabetes Markedly improved A1c is now normal on metformin She has an apple smart watch and we have asked her to activate atrial fibrillation detection and gave her instructions how she can export and transmitted strips through MyChart if it says atrial fibrillation or probable atrial fibrillation.   Next appointment: 1 year   Medication Adjustments/Labs and Tests Ordered: Current medicines are reviewed at length with the patient today.  Concerns regarding medicines are outlined above.  No orders of the defined types were placed in this encounter.  No orders of the defined types were placed in this encounter.   Chief Complaint  Patient presents with   Follow-up   Hypertension   Hyperlipidemia    History of Present Illness:    Kathy [name] is a 66 y.o. female with a hx of resistant hypertension hyperlipidemia and statin intolerance and type 2 diabetes   last seen 03/06/2021. She had a Myoview performed March 2021 with normal perfusion and function EF 63% low risk test And renal vascular duplex performed 11/08/2019 kidneys without findings of renal artery stenosis in the abdominal aorta was normal in caliber 2.3 cm.   Renal vascular resistive index was abnormal bilaterally.  Compliance with diet, lifestyle and medications: Yes  She has had marked weight loss medically supervised with GLP-1 agent Ozempic Feels better much better self image quality of life she has reduced antihypertensives at home and we have a plan that her systolics are 110 or less to reduce clonidine every other day for 2 weeks and then stop and take as needed for systolics greater than 180 and particularly avoid clonidine withdrawal She filled out forms for access to bempedoic acid she is statin intolerant was refused by [facility] and I am not sure why but nothing else was ever done.  In order to help her achieve lipid control and access to pharmaceuticals I [facility] refer her to the lipid clinic.  She is absolutely statin intolerant. She is having no cardiovascular symptoms of edema shortness of breath chest pain palpitation or syncope. She has not had a recent lipid profile we will check both a lipid profile and LP(a) today Past Medical History:  Diagnosis Date   Abdominal pain, epigastric 01/15/2016   Acute gout involving toe of right foot 09/01/2018   Acute idiopathic gout involving toe of left foot 10/09/2017   Adjustment insomnia 06/18/2018   Allergy    Anxiety    Apnea 11/08/2019   Arthritis    knees   Chest pain 09/14/2019   Cough 02/19/2018   Diabetes mellitus without complication (HCC)    Elevated uric acid in blood 12/09/2017  Encounter for health maintenance examination with abnormal findings 02/06/2018   Essential hypertension    H/O seasonal allergies    Hand dermatitis 06/18/2018   History of kidney stones    Hyperlipidemia    Hypertension    Lichen simplex chronicus 06/18/2018   Low back pain 05/06/2019   Mixed hyperlipidemia    Morbid obesity (HCC) 12/09/2017   Need for vaccination against Streptococcus pneumoniae using pneumococcal conjugate vaccine 13 06/18/2018   OA (osteoarthritis) of knee 10/09/2017    Pneumonia    Reactive airway disease 02/03/2018   Renal calculus, right    Right nephrolithiasis 01/15/2016   Type 2 diabetes mellitus (HCC)    UTI symptoms 05/06/2019   Vitamin D deficiency 02/06/2018   Wears contact lenses     Past Surgical History:  Procedure Laterality Date   ABDOMINAL HYSTERECTOMY     CHOLECYSTECTOMY N/A 01/16/2016   Procedure: LAPAROSCOPIC CHOLECYSTECTOMY WITH INTRAOPERATIVE CHOLANGIOGRAM;  Surgeon: Ovidio Kin, MD;  Location: [facility];  Service: General;  Laterality: N/A;   CYSTOSCOPY WITH RETROGRADE PYELOGRAM, URETEROSCOPY AND STENT PLACEMENT Right 03/07/2016   Procedure: CYSTOSCOPY WITH RETROGRADE PYELOGRAM, URETEROSCOPY AND STENT PLACEMENT ;  Surgeon: Bjorn Pippin, MD;  Location: [facility] [place];  Service: Urology;  Laterality: Right;   CYSTOSCOPY WITH RETROGRADE PYELOGRAM, URETEROSCOPY AND STENT PLACEMENT Right 03/21/2016   Procedure: CYSTOSCOPY WITH RIGHT RETROGRADE PYELOGRAM, RIGHT URETEROSCOPY WITH LASER  AND STENT PLACEMENT;  Surgeon: Bjorn Pippin, MD;  Location: [facility];  Service: Urology;  Laterality: Right;   HOLMIUM LASER APPLICATION N/A 03/21/2016   Procedure: HOLMIUM LASER APPLICATION;  Surgeon: Bjorn Pippin, MD;  Location: [facility];  Service: Urology;  Laterality: N/A;   TOTAL ABDOMINAL HYSTERECTOMY W/ BILATERAL SALPINGOOPHORECTOMY  08/1996    Current Medications: Current Meds  Medication Sig   ALPRAZolam (XANAX) 1 MG tablet Take 1 tablet (1 mg total) by mouth at bedtime as needed for anxiety.   amLODipine (NORVASC) 5 MG tablet Take 1 tablet (5 mg total) by mouth 2 (two) times daily.   aspirin EC 81 MG tablet Take 1 tablet (81 mg total) by mouth daily. Swallow whole.   carvedilol (COREG) 12.5 MG tablet Take 1 tablet (12.5 mg total) by mouth 2 (two) times daily.   cetirizine (ZYRTEC) 10 MG tablet Take 10 mg by mouth daily.   cloNIDine (CATAPRES) 0.1 MG tablet TAKE 2 TABLETS (0.2 MG TOTAL) BY MOUTH AT BEDTIME.   metFORMIN (GLUCOPHAGE-XR) 500 MG 24 hr tablet  TAKE 2 TABLETS .BY MOUTH AT BEDTIME.   OZEMPIC, 1 MG/DOSE, 4 MG/3ML SOPN Inject 1 mg into the skin once a week.   spironolactone (ALDACTONE) 25 MG tablet Take 1 tablet (25 mg total) by mouth daily.   telmisartan (MICARDIS) 40 MG tablet Take 1 tablet (40 mg total) by mouth daily.     Allergies:   Ezetimibe, Indomethacin, Statins, and Dexamethasone   Social History   Socioeconomic History   Marital status: Married    Spouse name: Not on file   Number of children: Not on file   Years of education: Not on file   Highest education level: Not on file  Occupational History   Not on file  Tobacco Use   Smoking status: Never   Smokeless tobacco: Never  Vaping Use   Vaping Use: Never used  Substance and Sexual Activity   Alcohol use: Yes    Comment: rare   Drug use: No   Sexual activity: Yes    Birth control/protection: Surgical  Other  Topics Concern   Not on file  Social History Narrative   Not on file   Social Determinants of Health   Financial Resource Strain: Not on file  Food Insecurity: Not on file  Transportation Needs: Not on file  Physical Activity: Not on file  Stress: Not on file  Social Connections: Not on file     Family History: The patient's  family history includes Aneurysm in her father; Breast cancer in her mother; Kidney Stones in her father. ROS:   Please see the history of present illness.    All other systems reviewed and are negative.  EKGs/Labs/Other Studies Reviewed:    The following studies were reviewed today:  EKG:  EKG ordered today and personally reviewed.  The ekg ordered today demonstrates sinus rhythm it is a normal EKG except for poor R wave progression nonspecific finding  Recent Labs: CMP was performed 10/15/2021 potassium 5.1 sodium 137 calcium elevated 10.4 normal liver function test A1c 6.1 Recent Lipid Panel    Component Value Date/Time   CHOL 185 02/18/2020 0823   TRIG 157 (H) 02/18/2020 0823   HDL 42 02/18/2020 0823    CHOLHDL 4.4 02/18/2020 0823   CHOLHDL 4 02/19/2019 0904   VLDL 35.4 02/19/2019 0904   LDLCALC 115 (H) 02/18/2020 0823   LDLDIRECT 179.0 09/14/2019 1218    Physical Exam:    VS:  BP 132/78   Pulse 80   Ht 5' 1.6" (1.565 m)   Wt 191 lb (86.6 kg)   SpO2 97%   BMI 35.39 kg/m     Wt Readings from Last 3 Encounters:  03/12/22 191 lb (86.6 kg)  03/06/21 233 lb (105.7 kg)  08/29/20 232 lb 1.3 oz (105.3 kg)     GEN: Marked improvement in her appearance BMI is down in the range of 35% well nourished, well developed in no acute distress HEENT: Normal NECK: No JVD; No carotid bruits LYMPHATICS: No lymphadenopathy CARDIAC: RRR, no murmurs, rubs, gallops RESPIRATORY:  Clear to auscultation without rales, wheezing or rhonchi  ABDOMEN: Soft, non-tender, non-distended MUSCULOSKELETAL:  No edema; No deformity  SKIN: Warm and dry NEUROLOGIC:  Alert and oriented x 3 PSYCHIATRIC:  Normal affect    Signed, Norman Herrlich, MD  03/12/2022 7:54 AM    [facility]

## 2022-03-12 NOTE — Patient Instructions (Addendum)
Medication Instructions:  Your physician recommends that you continue on your current medications as directed. Please refer to the Current Medication list given to you today.  *If you need a refill on your cardiac medications before your next appointment, please call your pharmacy*   Lab Work: Your physician recommends that you return for lab work in:   Labs on the 2nd floor: Lipids, LPa  If you have labs (blood work) drawn today and your tests are completely normal, you will receive your results only by: MyChart Message (if you have MyChart) OR A paper copy in the mail If you have any lab test that is abnormal or we need to change your treatment, we will call you to review the results.   Testing/Procedures: None   Follow-Up: At [facility], you and your health needs are our priority.  As part of our continuing mission to provide you with exceptional heart care, we have created designated Provider Care Teams.  These Care Teams include your primary Cardiologist (physician) and Advanced Practice Providers (APPs -  Physician Assistants and Nurse Practitioners) who all work together to provide you with the care you need, when you need it.  We recommend signing up for the patient portal called "MyChart".  Sign up information is provided on this After Visit Summary.  MyChart is used to connect with patients for Virtual Visits (Telemedicine).  Patients are able to view lab/test results, encounter notes, upcoming appointments, etc.  Non-urgent messages can be sent to your provider as well.   To learn more about what you can do with MyChart, go to [email or web address].    Your next appointment:   1 year(s)  The format for your next appointment:   In Person  Provider:   Norman Herrlich, MD    Other Instructions If blood pressure consistently < 110 reduce Clonidine to every other day for two weeks and then stop.  Can take Clonidine as needed if systolic blood pressure > 180    Important Information About Sugar

## 2022-03-13 ENCOUNTER — Other Ambulatory Visit: Payer: Self-pay | Admitting: *Deleted

## 2022-03-13 LAB — LIPID PANEL
Chol/HDL Ratio: 5.5 ratio — ABNORMAL HIGH (ref 0.0–4.4)
Cholesterol, Total: 227 mg/dL — ABNORMAL HIGH (ref 100–199)
HDL: 41 mg/dL (ref >39)
LDL Chol Calc (NIH): 160 mg/dL — ABNORMAL HIGH (ref 0–99)
Triglycerides: 145 mg/dL (ref 0–149)
VLDL Cholesterol Cal: 26 mg/dL (ref 5–40)

## 2022-03-13 LAB — LIPOPROTEIN A (LPA): Lipoprotein (a): 32.3 nmol/L (ref <75.0)

## 2022-03-13 MED ORDER — AMLODIPINE BESYLATE 5 MG PO TABS
5.0000 mg | ORAL_TABLET | Freq: Every day | ORAL | 3 refills | Status: AC
Start: 2022-03-13 — End: ?

## 2022-03-13 MED ORDER — TELMISARTAN 40 MG PO TABS: 40.0000 mg | ORAL_TABLET | Freq: Every day | ORAL | 3 refills | Status: AC

## 2022-03-13 MED ORDER — CARVEDILOL 12.5 MG PO TABS
12.5000 mg | ORAL_TABLET | Freq: Two times a day (BID) | ORAL | 3 refills | Status: AC
Start: 2022-03-13 — End: ?

## 2022-03-13 MED ORDER — SPIRONOLACTONE 25 MG PO TABS: 25.0000 mg | ORAL_TABLET | Freq: Every day | ORAL | 3 refills | Status: AC

## 2022-03-13 MED ORDER — CLONIDINE HCL 0.1 MG PO TABS: ORAL_TABLET | ORAL | 3 refills | Status: AC

## 2022-03-14 ENCOUNTER — Other Ambulatory Visit: Payer: Self-pay

## 2022-03-18 ENCOUNTER — Encounter: Payer: Self-pay | Admitting: Cardiology

## 2022-03-18 ENCOUNTER — Other Ambulatory Visit: Payer: Self-pay | Admitting: Cardiology

## 2022-03-18 ENCOUNTER — Telehealth: Payer: Self-pay

## 2022-03-18 MED ORDER — NEXLETOL 180 MG PO TABS: 1.0000 | ORAL_TABLET | Freq: Every day | ORAL | 12 refills | Status: DC

## 2022-03-18 NOTE — Telephone Encounter (Signed)
Message sent to Dr. Tomie China

## 2022-03-18 NOTE — Addendum Note (Signed)
Addended by: Eleonore Chiquito on: 03/18/2022 01:16 PM   Modules accepted: Orders

## 2022-03-18 NOTE — Telephone Encounter (Signed)
-----   Message from Baldo Daub, MD sent at 03/13/2022  5:19 PM EDT ----- Regarding: RE: Nexlizet Okay please list this as an allergy, bempedoic acid by itself is either 160 or 180 mg daily. ----- Message ----- From: Heywood Bene, CMA Sent: 03/13/2022   4:54 PM EDT To: Baldo Daub, MD Subject: Nexlizet                                       Patient cholesterol is severely high you stated and you've recommend she start on Nexlizet however the patient muscle aches with Zetia and she has concern starting the nexlizet because it has Zetia as one of the ingredients. Please advise if you want her to try this or change to another medicine. Thanks

## 2022-03-21 NOTE — Telephone Encounter (Signed)
PA started on CMM. [facility]

## 2022-03-25 ENCOUNTER — Telehealth: Payer: Self-pay

## 2022-03-25 DIAGNOSIS — E782 Mixed hyperlipidemia: Secondary | ICD-10-CM

## 2022-03-25 DIAGNOSIS — I10 Essential (primary) hypertension: Secondary | ICD-10-CM

## 2022-03-25 NOTE — Telephone Encounter (Signed)
-----   Message from Garwin Brothers, MD sent at 03/18/2022  1:22 PM EDT ----- Regarding: RE: Nexlizet Lipid clinic would be appropriate ----- Message ----- From: Heywood Bene, CMA Sent: 03/18/2022  10:35 AM EDT To: Garwin Brothers, MD Subject: FW: Nexlizet                                   Following message below: Dr. Dulce Sellar approved Bempedoic Acid however while back I was not able to approved this and appeal was denied as well. Patient cannot tolerate statins or Ezetimibe. Should she go to lipid clinic or what other suggestions please in Munley's absence. Thanks  ----- Message ----- From: Baldo Daub, MD Sent: 03/13/2022   5:19 PM EDT To: Tiburcio Pea Merion Grimaldo, CMA Subject: RE: Nexlizet                                   Okay please list this as an allergy, bempedoic acid by itself is either 160 or 180 mg daily. ----- Message ----- From: Heywood Bene, CMA Sent: 03/13/2022   4:54 PM EDT To: Baldo Daub, MD Subject: Nexlizet                                       Patient cholesterol is severely high you stated and you've recommend she start on Nexlizet however the patient muscle aches with Zetia and she has concern starting the nexlizet because it has Zetia as one of the ingredients. Please advise if you want her to try this or change to another medicine. Thanks

## 2022-03-25 NOTE — Telephone Encounter (Signed)
LM informing the patient lipid clinic referral sent and will be contacted for an appt. Ok per [employer].

## 2022-04-01 DIAGNOSIS — I10 Essential (primary) hypertension: Secondary | ICD-10-CM | POA: Diagnosis not present

## 2022-04-01 DIAGNOSIS — M159 Polyosteoarthritis, unspecified: Secondary | ICD-10-CM | POA: Diagnosis not present

## 2022-04-01 DIAGNOSIS — E8889 Other specified metabolic disorders: Secondary | ICD-10-CM | POA: Diagnosis not present

## 2022-04-01 DIAGNOSIS — E1169 Type 2 diabetes mellitus with other specified complication: Secondary | ICD-10-CM | POA: Diagnosis not present

## 2022-04-11 ENCOUNTER — Encounter: Payer: Self-pay | Admitting: Pharmacist Clinician (PhC)/ Clinical Pharmacy Specialist

## 2022-04-11 ENCOUNTER — Ambulatory Visit
Payer: BC Managed Care – PPO | Attending: Cardiology | Admitting: Pharmacist Clinician (PhC)/ Clinical Pharmacy Specialist

## 2022-04-11 DIAGNOSIS — E782 Mixed hyperlipidemia: Secondary | ICD-10-CM | POA: Diagnosis not present

## 2022-04-11 MED ORDER — ROSUVASTATIN CALCIUM 5 MG PO TABS: ORAL_TABLET | ORAL | 6 refills | Status: DC

## 2022-04-11 NOTE — Patient Instructions (Signed)
Your Results:             Your most recent labs Goal  Total Cholesterol 227 < 200  Triglycerides 145 < 150  HDL (happy/good cholesterol) 41 > 40  LDL (lousy/bad cholesterol 160 < 70   Medication changes:  Take rosuvastatin 5 mg once weekly for 4 weeks, then increase to twice weekly as tolerated.  Can increase to three times weekly after another 4 weeks if you want.   Lab orders:  We want to repeat labs after 2-3 months.  We will send you a lab order to remind you once we get closer to that time.      Thank you for choosing [facility]

## 2022-04-11 NOTE — Progress Notes (Signed)
04/11/2022 Kathy [name] Nov 22, 1955 [id number]   HPI:  Kathy [name] is a 66 y.o. female patient of Dr Dulce Sellar, who presents today for a lipid clinic evaluation.  See pertinent past medical history below.  She has tried multiple statin drugs over the years, but has never been able to tolerate for more than 2-3 weeks before myalgias developed.  Similar results when she tried ezetimibe.    Past Medical History: hypertension Much improved with weight loss, now tapering off   DM2 Most recent A1c at 6.1, has lost 47 pounds on Ozempic  gout No recent events, has stopped allopurinol  Obesity BMI dropped from 42.4 to 35.4 on Ozempic, just increased to 2 mg dose    Current Medications: none  Cholesterol Goals: LDL < 100   Intolerant/previously tried: simvastatin, rosuvastatin, atorvastatin - would notice myalgias within 1-3 weeks    Family history: neither parent had MI or stroke,  dad had aneurysm and died at 7; mother died at 35 breast cancer; 1 brother living w/o heart issues; 1 brother deceased - suicide; 2 sons, younger had MI at 38, doing well now  Diet: eats out regularly, no fast food; no added salt; plenty of vegetables, has lost 47 pounds on Ozempic (just increased to 2 mg)  Exercise:  walks regularly  Labs: 8/23:  TC 227, TG 145, HDL 41, LDL 160   Current Outpatient Medications  Medication Sig Dispense Refill   ALPRAZolam (XANAX) 1 MG tablet Take 1 tablet (1 mg total) by mouth at bedtime as needed for anxiety. 20 tablet 0   amLODipine (NORVASC) 5 MG tablet Take 1 tablet (5 mg total) by mouth daily. 90 tablet 3   aspirin EC 81 MG tablet Take 1 tablet (81 mg total) by mouth daily. Swallow whole. 90 tablet 3   Biotin 93790 MCG TABS Take 1 tablet by mouth daily.     carvedilol (COREG) 12.5 MG tablet Take 1 tablet (12.5 mg total) by mouth 2 (two) times daily. 180 tablet 3   cetirizine (ZYRTEC) 10 MG tablet Take 10 mg by mouth daily.     cloNIDine (CATAPRES) 0.1 MG  tablet Take 1 tablet (0.1 mg) by mouth once daily in the evening 90 tablet 3   metFORMIN (GLUCOPHAGE-XR) 500 MG 24 hr tablet TAKE 2 TABLETS .BY MOUTH AT BEDTIME. 180 tablet 1   Multiple Vitamins-Minerals (CENTRUM ADULT PO) Take 1 tablet by mouth daily.     rosuvastatin (CRESTOR) 5 MG tablet Take 1 tablet by mouth up to three times weekly as tolerated 12 tablet 6   spironolactone (ALDACTONE) 25 MG tablet Take 1 tablet (25 mg total) by mouth daily. 90 tablet 3   telmisartan (MICARDIS) 40 MG tablet Take 1 tablet (40 mg total) by mouth daily. 90 tablet 3   No current facility-administered medications for this visit.    Allergies  Allergen Reactions   Ezetimibe     Other reaction(s): Other statin   Indomethacin    Nexlizet [Bempedoic Acid-Ezetimibe] Other (See Comments)    Muscle aches   Statins     Heaviness in legs    Dexamethasone Palpitations and Rash    Heart races    Past Medical History:  Diagnosis Date   Abdominal pain, epigastric 01/15/2016   Acute gout involving toe of right foot 09/01/2018   Acute idiopathic gout involving toe of left foot 10/09/2017   Adjustment insomnia 06/18/2018   Allergy    Anxiety    Apnea 11/08/2019  Arthritis    knees   Chest pain 09/14/2019   Cough 02/19/2018   Diabetes mellitus without complication (HCC)    Elevated uric acid in blood 12/09/2017   Encounter for health maintenance examination with abnormal findings 02/06/2018   Essential hypertension    H/O seasonal allergies    Hand dermatitis 06/18/2018   History of kidney stones    Hyperlipidemia    Hypertension    Lichen simplex chronicus 06/18/2018   Low back pain 05/06/2019   Mixed hyperlipidemia    Morbid obesity (HCC) 12/09/2017   Need for vaccination against Streptococcus pneumoniae using pneumococcal conjugate vaccine 13 06/18/2018   OA (osteoarthritis) of knee 10/09/2017   Pneumonia    Reactive airway disease 02/03/2018   Renal calculus, right    Right nephrolithiasis 01/15/2016   Type 2  diabetes mellitus (HCC)    UTI symptoms 05/06/2019   Vitamin D deficiency 02/06/2018   Wears contact lenses     There were no vitals taken for this visit.   Mixed hyperlipidemia Patient with hyperlipidemia, LDL at 160, however no indication of CAD in her file.  Unfortunately we are not able to get PCSK-9 inhibitors, bempedoic acid or inclisiran for patient for primary prevention.  Reviewed this information with patient.  Only option at this point would be to try once or twice weekly rosuvastatin.  Will have her start with 5 mg tablets and take one weekly for 4 weeks then increase to twice weekly if tolerated.  We can try three times weekly after another month, but will leave to patient discretion.     Phillips Hay PharmD CPP [facility] [address] [place], [place] [address] [phone]

## 2022-04-11 NOTE — Assessment & Plan Note (Signed)
Patient with hyperlipidemia, LDL at 160, however no indication of CAD in her file.  Unfortunately we are not able to get PCSK-9 inhibitors, bempedoic acid or inclisiran for patient for primary prevention.  Reviewed this information with patient.  Only option at this point would be to try once or twice weekly rosuvastatin.  Will have her start with 5 mg tablets and take one weekly for 4 weeks then increase to twice weekly if tolerated.  We can try three times weekly after another month, but will leave to patient discretion.

## 2022-04-29 DIAGNOSIS — M159 Polyosteoarthritis, unspecified: Secondary | ICD-10-CM | POA: Diagnosis not present

## 2022-04-29 DIAGNOSIS — I10 Essential (primary) hypertension: Secondary | ICD-10-CM | POA: Diagnosis not present

## 2022-04-29 DIAGNOSIS — E1169 Type 2 diabetes mellitus with other specified complication: Secondary | ICD-10-CM | POA: Diagnosis not present

## 2022-04-29 DIAGNOSIS — E669 Obesity, unspecified: Secondary | ICD-10-CM | POA: Diagnosis not present

## 2022-05-10 DIAGNOSIS — I10 Essential (primary) hypertension: Secondary | ICD-10-CM | POA: Diagnosis not present

## 2022-05-10 DIAGNOSIS — E785 Hyperlipidemia, unspecified: Secondary | ICD-10-CM | POA: Diagnosis not present

## 2022-05-10 DIAGNOSIS — E119 Type 2 diabetes mellitus without complications: Secondary | ICD-10-CM | POA: Diagnosis not present

## 2022-05-30 DIAGNOSIS — I10 Essential (primary) hypertension: Secondary | ICD-10-CM | POA: Diagnosis not present

## 2022-05-30 DIAGNOSIS — E669 Obesity, unspecified: Secondary | ICD-10-CM | POA: Diagnosis not present

## 2022-05-30 DIAGNOSIS — E1169 Type 2 diabetes mellitus with other specified complication: Secondary | ICD-10-CM | POA: Diagnosis not present

## 2022-05-30 DIAGNOSIS — M159 Polyosteoarthritis, unspecified: Secondary | ICD-10-CM | POA: Diagnosis not present

## 2022-06-11 ENCOUNTER — Encounter: Payer: Self-pay | Admitting: Pharmacist Clinician (PhC)/ Clinical Pharmacy Specialist

## 2022-06-11 DIAGNOSIS — E782 Mixed hyperlipidemia: Secondary | ICD-10-CM

## 2022-07-01 DIAGNOSIS — Z0000 Encounter for general adult medical examination without abnormal findings: Secondary | ICD-10-CM | POA: Diagnosis not present

## 2022-07-01 DIAGNOSIS — E669 Obesity, unspecified: Secondary | ICD-10-CM | POA: Diagnosis not present

## 2022-07-01 DIAGNOSIS — E785 Hyperlipidemia, unspecified: Secondary | ICD-10-CM | POA: Diagnosis not present

## 2022-07-01 DIAGNOSIS — I10 Essential (primary) hypertension: Secondary | ICD-10-CM | POA: Diagnosis not present

## 2022-07-03 ENCOUNTER — Other Ambulatory Visit (HOSPITAL_BASED_OUTPATIENT_CLINIC_OR_DEPARTMENT_OTHER): Payer: Self-pay | Admitting: Pharmacist Clinician (PhC)/ Clinical Pharmacy Specialist

## 2022-07-03 ENCOUNTER — Encounter: Payer: Self-pay | Admitting: Cardiology

## 2022-07-03 MED ORDER — ROSUVASTATIN CALCIUM 5 MG PO TABS: ORAL_TABLET | ORAL | 3 refills | Status: AC

## 2022-07-08 ENCOUNTER — Other Ambulatory Visit: Payer: Self-pay

## 2022-07-11 DIAGNOSIS — M159 Polyosteoarthritis, unspecified: Secondary | ICD-10-CM | POA: Diagnosis not present

## 2022-07-11 DIAGNOSIS — E669 Obesity, unspecified: Secondary | ICD-10-CM | POA: Diagnosis not present

## 2022-07-11 DIAGNOSIS — I10 Essential (primary) hypertension: Secondary | ICD-10-CM | POA: Diagnosis not present

## 2022-07-11 DIAGNOSIS — E559 Vitamin D deficiency, unspecified: Secondary | ICD-10-CM | POA: Diagnosis not present

## 2022-07-11 DIAGNOSIS — E119 Type 2 diabetes mellitus without complications: Secondary | ICD-10-CM | POA: Diagnosis not present

## 2022-07-11 DIAGNOSIS — M109 Gout, unspecified: Secondary | ICD-10-CM | POA: Diagnosis not present

## 2022-07-11 DIAGNOSIS — E1169 Type 2 diabetes mellitus with other specified complication: Secondary | ICD-10-CM | POA: Diagnosis not present

## 2022-07-15 DIAGNOSIS — H3589 Other specified retinal disorders: Secondary | ICD-10-CM | POA: Diagnosis not present

## 2022-07-15 DIAGNOSIS — H52203 Unspecified astigmatism, bilateral: Secondary | ICD-10-CM | POA: Diagnosis not present

## 2022-07-15 DIAGNOSIS — Z7984 Long term (current) use of oral hypoglycemic drugs: Secondary | ICD-10-CM | POA: Diagnosis not present

## 2022-07-15 DIAGNOSIS — H5203 Hypermetropia, bilateral: Secondary | ICD-10-CM | POA: Diagnosis not present

## 2022-07-15 DIAGNOSIS — H04123 Dry eye syndrome of bilateral lacrimal glands: Secondary | ICD-10-CM | POA: Diagnosis not present

## 2022-07-15 DIAGNOSIS — H0100A Unspecified blepharitis right eye, upper and lower eyelids: Secondary | ICD-10-CM | POA: Diagnosis not present

## 2022-07-15 DIAGNOSIS — H524 Presbyopia: Secondary | ICD-10-CM | POA: Diagnosis not present

## 2022-07-15 DIAGNOSIS — E119 Type 2 diabetes mellitus without complications: Secondary | ICD-10-CM | POA: Diagnosis not present

## 2022-08-16 DIAGNOSIS — Z1231 Encounter for screening mammogram for malignant neoplasm of breast: Secondary | ICD-10-CM | POA: Diagnosis not present

## 2022-08-16 DIAGNOSIS — R92323 Mammographic fibroglandular density, bilateral breasts: Secondary | ICD-10-CM | POA: Diagnosis not present

## 2022-08-22 DIAGNOSIS — E1169 Type 2 diabetes mellitus with other specified complication: Secondary | ICD-10-CM | POA: Diagnosis not present

## 2022-08-22 DIAGNOSIS — M159 Polyosteoarthritis, unspecified: Secondary | ICD-10-CM | POA: Diagnosis not present

## 2022-08-22 DIAGNOSIS — I10 Essential (primary) hypertension: Secondary | ICD-10-CM | POA: Diagnosis not present

## 2022-10-06 DIAGNOSIS — M10071 Idiopathic gout, right ankle and foot: Secondary | ICD-10-CM | POA: Diagnosis not present

## 2022-10-17 ENCOUNTER — Other Ambulatory Visit (HOSPITAL_BASED_OUTPATIENT_CLINIC_OR_DEPARTMENT_OTHER): Payer: Self-pay

## 2022-10-18 ENCOUNTER — Other Ambulatory Visit (HOSPITAL_BASED_OUTPATIENT_CLINIC_OR_DEPARTMENT_OTHER): Payer: Self-pay

## 2022-10-22 ENCOUNTER — Other Ambulatory Visit (HOSPITAL_BASED_OUTPATIENT_CLINIC_OR_DEPARTMENT_OTHER): Payer: Self-pay

## 2022-10-22 DIAGNOSIS — E1169 Type 2 diabetes mellitus with other specified complication: Secondary | ICD-10-CM | POA: Diagnosis not present

## 2022-10-22 DIAGNOSIS — E669 Obesity, unspecified: Secondary | ICD-10-CM | POA: Diagnosis not present

## 2022-10-22 DIAGNOSIS — I10 Essential (primary) hypertension: Secondary | ICD-10-CM | POA: Diagnosis not present

## 2022-10-22 DIAGNOSIS — M159 Polyosteoarthritis, unspecified: Secondary | ICD-10-CM | POA: Diagnosis not present

## 2022-10-22 MED ORDER — MOUNJARO 7.5 MG/0.5ML ~~LOC~~ SOAJ
7.5000 mg | SUBCUTANEOUS | 2 refills | Status: DC
  Filled 2022-10-22: qty 2, 28d supply, fill #0
  Filled 2022-11-14: qty 2, 28d supply, fill #1
  Filled 2022-12-10: qty 2, 28d supply, fill #2

## 2022-10-22 MED ORDER — ONDANSETRON HCL 4 MG PO TABS
4.0000 mg | ORAL_TABLET | Freq: Four times a day (QID) | ORAL | 0 refills | Status: AC | PRN
  Filled 2022-10-22: qty 30, 8d supply, fill #0

## 2022-11-18 ENCOUNTER — Encounter: Payer: Self-pay | Admitting: *Deleted

## 2022-12-10 ENCOUNTER — Other Ambulatory Visit (HOSPITAL_BASED_OUTPATIENT_CLINIC_OR_DEPARTMENT_OTHER): Payer: Self-pay

## 2022-12-13 DIAGNOSIS — E119 Type 2 diabetes mellitus without complications: Secondary | ICD-10-CM | POA: Diagnosis not present

## 2022-12-13 DIAGNOSIS — I10 Essential (primary) hypertension: Secondary | ICD-10-CM | POA: Diagnosis not present

## 2022-12-13 DIAGNOSIS — Z1330 Encounter for screening examination for mental health and behavioral disorders, unspecified: Secondary | ICD-10-CM | POA: Diagnosis not present

## 2022-12-13 DIAGNOSIS — E785 Hyperlipidemia, unspecified: Secondary | ICD-10-CM | POA: Diagnosis not present

## 2022-12-17 ENCOUNTER — Other Ambulatory Visit (HOSPITAL_BASED_OUTPATIENT_CLINIC_OR_DEPARTMENT_OTHER): Payer: Self-pay

## 2022-12-17 DIAGNOSIS — E1169 Type 2 diabetes mellitus with other specified complication: Secondary | ICD-10-CM | POA: Diagnosis not present

## 2022-12-17 DIAGNOSIS — E669 Obesity, unspecified: Secondary | ICD-10-CM | POA: Diagnosis not present

## 2022-12-17 DIAGNOSIS — M159 Polyosteoarthritis, unspecified: Secondary | ICD-10-CM | POA: Diagnosis not present

## 2022-12-17 DIAGNOSIS — I10 Essential (primary) hypertension: Secondary | ICD-10-CM | POA: Diagnosis not present

## 2022-12-17 MED ORDER — MOUNJARO 7.5 MG/0.5ML ~~LOC~~ SOAJ
7.5000 mg | SUBCUTANEOUS | 2 refills | Status: AC
  Filled 2022-12-17 – 2023-01-06 (×2): qty 2, 28d supply, fill #0
  Filled 2023-02-01 – 2023-02-04 (×2): qty 2, 28d supply, fill #1

## 2023-01-07 ENCOUNTER — Other Ambulatory Visit (HOSPITAL_BASED_OUTPATIENT_CLINIC_OR_DEPARTMENT_OTHER): Payer: Self-pay

## 2023-02-03 ENCOUNTER — Other Ambulatory Visit (HOSPITAL_BASED_OUTPATIENT_CLINIC_OR_DEPARTMENT_OTHER): Payer: Self-pay

## 2023-02-04 ENCOUNTER — Other Ambulatory Visit (HOSPITAL_BASED_OUTPATIENT_CLINIC_OR_DEPARTMENT_OTHER): Payer: Self-pay

## 2023-02-17 ENCOUNTER — Other Ambulatory Visit (HOSPITAL_BASED_OUTPATIENT_CLINIC_OR_DEPARTMENT_OTHER): Payer: Self-pay

## 2023-02-17 DIAGNOSIS — Z9189 Other specified personal risk factors, not elsewhere classified: Secondary | ICD-10-CM | POA: Diagnosis not present

## 2023-02-17 DIAGNOSIS — E1169 Type 2 diabetes mellitus with other specified complication: Secondary | ICD-10-CM | POA: Diagnosis not present

## 2023-02-17 DIAGNOSIS — M159 Polyosteoarthritis, unspecified: Secondary | ICD-10-CM | POA: Diagnosis not present

## 2023-02-17 DIAGNOSIS — I10 Essential (primary) hypertension: Secondary | ICD-10-CM | POA: Diagnosis not present

## 2023-02-17 DIAGNOSIS — E669 Obesity, unspecified: Secondary | ICD-10-CM | POA: Diagnosis not present

## 2023-02-17 MED ORDER — MOUNJARO 7.5 MG/0.5ML ~~LOC~~ SOAJ
7.5000 mg | SUBCUTANEOUS | 1 refills | Status: AC
  Filled 2023-02-17: qty 2, 28d supply, fill #0
  Filled 2023-02-28 – 2023-03-04 (×2): qty 6, 84d supply, fill #0
  Filled 2023-05-22: qty 6, 84d supply, fill #1

## 2023-02-28 ENCOUNTER — Other Ambulatory Visit (HOSPITAL_BASED_OUTPATIENT_CLINIC_OR_DEPARTMENT_OTHER): Payer: Self-pay

## 2023-03-04 ENCOUNTER — Other Ambulatory Visit (HOSPITAL_BASED_OUTPATIENT_CLINIC_OR_DEPARTMENT_OTHER): Payer: Self-pay

## 2023-03-07 ENCOUNTER — Ambulatory Visit: Payer: BC Managed Care – PPO | Admitting: Cardiology

## 2023-03-12 ENCOUNTER — Ambulatory Visit: Payer: BC Managed Care – PPO | Admitting: Cardiology

## 2023-04-28 DIAGNOSIS — E1169 Type 2 diabetes mellitus with other specified complication: Secondary | ICD-10-CM | POA: Diagnosis not present

## 2023-04-28 DIAGNOSIS — I10 Essential (primary) hypertension: Secondary | ICD-10-CM | POA: Diagnosis not present

## 2023-04-28 DIAGNOSIS — M159 Polyosteoarthritis, unspecified: Secondary | ICD-10-CM | POA: Diagnosis not present

## 2023-04-28 DIAGNOSIS — E669 Obesity, unspecified: Secondary | ICD-10-CM | POA: Diagnosis not present

## 2023-05-22 ENCOUNTER — Other Ambulatory Visit (HOSPITAL_BASED_OUTPATIENT_CLINIC_OR_DEPARTMENT_OTHER): Payer: Self-pay

## 2023-06-23 ENCOUNTER — Other Ambulatory Visit (HOSPITAL_BASED_OUTPATIENT_CLINIC_OR_DEPARTMENT_OTHER): Payer: Self-pay

## 2023-06-23 DIAGNOSIS — E1169 Type 2 diabetes mellitus with other specified complication: Secondary | ICD-10-CM | POA: Diagnosis not present

## 2023-06-23 DIAGNOSIS — M159 Polyosteoarthritis, unspecified: Secondary | ICD-10-CM | POA: Diagnosis not present

## 2023-06-23 DIAGNOSIS — E669 Obesity, unspecified: Secondary | ICD-10-CM | POA: Diagnosis not present

## 2023-06-23 DIAGNOSIS — I10 Essential (primary) hypertension: Secondary | ICD-10-CM | POA: Diagnosis not present

## 2023-06-23 MED ORDER — MOUNJARO 10 MG/0.5ML ~~LOC~~ SOAJ
10.0000 mg | SUBCUTANEOUS | 1 refills | Status: AC
  Filled 2023-06-23: qty 6, 84d supply, fill #0
  Filled 2023-06-23: qty 2, 28d supply, fill #0
  Filled 2023-07-21: qty 6, 84d supply, fill #1

## 2023-06-25 ENCOUNTER — Other Ambulatory Visit (HOSPITAL_BASED_OUTPATIENT_CLINIC_OR_DEPARTMENT_OTHER): Payer: Self-pay

## 2023-07-09 DIAGNOSIS — E119 Type 2 diabetes mellitus without complications: Secondary | ICD-10-CM | POA: Diagnosis not present

## 2023-07-09 DIAGNOSIS — Z0000 Encounter for general adult medical examination without abnormal findings: Secondary | ICD-10-CM | POA: Diagnosis not present

## 2023-07-09 DIAGNOSIS — I10 Essential (primary) hypertension: Secondary | ICD-10-CM | POA: Diagnosis not present

## 2023-07-09 DIAGNOSIS — E785 Hyperlipidemia, unspecified: Secondary | ICD-10-CM | POA: Diagnosis not present

## 2023-07-09 DIAGNOSIS — Z1211 Encounter for screening for malignant neoplasm of colon: Secondary | ICD-10-CM | POA: Diagnosis not present

## 2023-07-21 ENCOUNTER — Other Ambulatory Visit (HOSPITAL_BASED_OUTPATIENT_CLINIC_OR_DEPARTMENT_OTHER): Payer: Self-pay

## 2023-07-21 ENCOUNTER — Other Ambulatory Visit (HOSPITAL_COMMUNITY): Payer: Self-pay

## 2023-08-14 DIAGNOSIS — H04123 Dry eye syndrome of bilateral lacrimal glands: Secondary | ICD-10-CM | POA: Diagnosis not present

## 2023-08-14 DIAGNOSIS — E119 Type 2 diabetes mellitus without complications: Secondary | ICD-10-CM | POA: Diagnosis not present

## 2023-08-14 DIAGNOSIS — H3589 Other specified retinal disorders: Secondary | ICD-10-CM | POA: Diagnosis not present

## 2023-08-14 DIAGNOSIS — H0100A Unspecified blepharitis right eye, upper and lower eyelids: Secondary | ICD-10-CM | POA: Diagnosis not present

## 2023-08-18 DIAGNOSIS — I10 Essential (primary) hypertension: Secondary | ICD-10-CM | POA: Diagnosis not present

## 2023-08-18 DIAGNOSIS — M159 Polyosteoarthritis, unspecified: Secondary | ICD-10-CM | POA: Diagnosis not present

## 2023-08-18 DIAGNOSIS — E1169 Type 2 diabetes mellitus with other specified complication: Secondary | ICD-10-CM | POA: Diagnosis not present

## 2023-08-22 DIAGNOSIS — R92323 Mammographic fibroglandular density, bilateral breasts: Secondary | ICD-10-CM | POA: Diagnosis not present

## 2023-08-22 DIAGNOSIS — Z1231 Encounter for screening mammogram for malignant neoplasm of breast: Secondary | ICD-10-CM | POA: Diagnosis not present

## 2023-10-21 ENCOUNTER — Other Ambulatory Visit (HOSPITAL_BASED_OUTPATIENT_CLINIC_OR_DEPARTMENT_OTHER): Payer: Self-pay

## 2023-10-21 DIAGNOSIS — I10 Essential (primary) hypertension: Secondary | ICD-10-CM | POA: Diagnosis not present

## 2023-10-21 DIAGNOSIS — E663 Overweight: Secondary | ICD-10-CM | POA: Diagnosis not present

## 2023-10-21 DIAGNOSIS — E1169 Type 2 diabetes mellitus with other specified complication: Secondary | ICD-10-CM | POA: Diagnosis not present

## 2023-10-21 DIAGNOSIS — M159 Polyosteoarthritis, unspecified: Secondary | ICD-10-CM | POA: Diagnosis not present

## 2023-10-21 MED ORDER — ONDANSETRON HCL 4 MG PO TABS
4.0000 mg | ORAL_TABLET | Freq: Four times a day (QID) | ORAL | 0 refills | Status: AC | PRN
  Filled 2023-10-21: qty 30, 8d supply, fill #0

## 2023-10-21 MED ORDER — MOUNJARO 10 MG/0.5ML ~~LOC~~ SOAJ
10.0000 mg | SUBCUTANEOUS | 1 refills | Status: AC
  Filled 2023-10-21: qty 6, 84d supply, fill #0
  Filled 2023-11-17: qty 2, 28d supply, fill #1
  Filled 2023-12-14: qty 2, 28d supply, fill #2
  Filled 2024-01-11: qty 2, 28d supply, fill #3

## 2023-10-22 ENCOUNTER — Other Ambulatory Visit (HOSPITAL_BASED_OUTPATIENT_CLINIC_OR_DEPARTMENT_OTHER): Payer: Self-pay

## 2023-10-23 ENCOUNTER — Other Ambulatory Visit (HOSPITAL_COMMUNITY): Payer: Self-pay

## 2023-10-23 ENCOUNTER — Other Ambulatory Visit (HOSPITAL_BASED_OUTPATIENT_CLINIC_OR_DEPARTMENT_OTHER): Payer: Self-pay

## 2023-10-23 ENCOUNTER — Other Ambulatory Visit: Payer: Self-pay

## 2023-10-24 ENCOUNTER — Other Ambulatory Visit (HOSPITAL_BASED_OUTPATIENT_CLINIC_OR_DEPARTMENT_OTHER): Payer: Self-pay

## 2023-11-13 DIAGNOSIS — M1711 Unilateral primary osteoarthritis, right knee: Secondary | ICD-10-CM | POA: Diagnosis not present

## 2023-11-13 DIAGNOSIS — I10 Essential (primary) hypertension: Secondary | ICD-10-CM | POA: Diagnosis not present

## 2023-11-13 DIAGNOSIS — R52 Pain, unspecified: Secondary | ICD-10-CM | POA: Diagnosis not present

## 2023-11-17 ENCOUNTER — Other Ambulatory Visit (HOSPITAL_BASED_OUTPATIENT_CLINIC_OR_DEPARTMENT_OTHER): Payer: Self-pay

## 2023-12-15 ENCOUNTER — Other Ambulatory Visit (HOSPITAL_BASED_OUTPATIENT_CLINIC_OR_DEPARTMENT_OTHER): Payer: Self-pay

## 2024-01-04 DIAGNOSIS — I10 Essential (primary) hypertension: Secondary | ICD-10-CM | POA: Diagnosis not present

## 2024-01-04 DIAGNOSIS — H00014 Hordeolum externum left upper eyelid: Secondary | ICD-10-CM | POA: Diagnosis not present

## 2024-01-12 ENCOUNTER — Other Ambulatory Visit (HOSPITAL_BASED_OUTPATIENT_CLINIC_OR_DEPARTMENT_OTHER): Payer: Self-pay

## 2024-02-02 ENCOUNTER — Other Ambulatory Visit (HOSPITAL_BASED_OUTPATIENT_CLINIC_OR_DEPARTMENT_OTHER): Payer: Self-pay

## 2024-02-02 DIAGNOSIS — E1169 Type 2 diabetes mellitus with other specified complication: Secondary | ICD-10-CM | POA: Diagnosis not present

## 2024-02-02 DIAGNOSIS — M159 Polyosteoarthritis, unspecified: Secondary | ICD-10-CM | POA: Diagnosis not present

## 2024-02-02 DIAGNOSIS — I10 Essential (primary) hypertension: Secondary | ICD-10-CM | POA: Diagnosis not present

## 2024-02-02 DIAGNOSIS — E663 Overweight: Secondary | ICD-10-CM | POA: Diagnosis not present

## 2024-02-02 MED ORDER — MOUNJARO 10 MG/0.5ML ~~LOC~~ SOAJ
10.0000 mg | SUBCUTANEOUS | 3 refills | Status: AC
  Filled 2024-02-02 – 2024-02-05 (×2): qty 2, 28d supply, fill #0

## 2024-02-03 DIAGNOSIS — E785 Hyperlipidemia, unspecified: Secondary | ICD-10-CM | POA: Diagnosis not present

## 2024-02-03 DIAGNOSIS — F418 Other specified anxiety disorders: Secondary | ICD-10-CM | POA: Diagnosis not present

## 2024-02-03 DIAGNOSIS — I10 Essential (primary) hypertension: Secondary | ICD-10-CM | POA: Diagnosis not present

## 2024-02-03 DIAGNOSIS — Z1331 Encounter for screening for depression: Secondary | ICD-10-CM | POA: Diagnosis not present

## 2024-02-03 DIAGNOSIS — E119 Type 2 diabetes mellitus without complications: Secondary | ICD-10-CM | POA: Diagnosis not present

## 2024-02-05 ENCOUNTER — Other Ambulatory Visit (HOSPITAL_BASED_OUTPATIENT_CLINIC_OR_DEPARTMENT_OTHER): Payer: Self-pay

## 2024-02-11 ENCOUNTER — Other Ambulatory Visit (HOSPITAL_BASED_OUTPATIENT_CLINIC_OR_DEPARTMENT_OTHER): Payer: Self-pay

## 2024-05-13 DIAGNOSIS — M1711 Unilateral primary osteoarthritis, right knee: Secondary | ICD-10-CM | POA: Diagnosis not present

## 2024-05-13 DIAGNOSIS — I10 Essential (primary) hypertension: Secondary | ICD-10-CM | POA: Diagnosis not present

## 2024-05-17 DIAGNOSIS — E663 Overweight: Secondary | ICD-10-CM | POA: Diagnosis not present

## 2024-05-17 DIAGNOSIS — I10 Essential (primary) hypertension: Secondary | ICD-10-CM | POA: Diagnosis not present

## 2024-05-17 DIAGNOSIS — M159 Polyosteoarthritis, unspecified: Secondary | ICD-10-CM | POA: Diagnosis not present
